# Patient Record
Sex: Female | Born: 1965 | Race: Black or African American | Hispanic: No | Marital: Married
Health system: Southern US, Community
[De-identification: ages and names within clinical notes are randomized; demographics above are authoritative.]

## PROBLEM LIST (undated history)

## (undated) DIAGNOSIS — J45909 Unspecified asthma, uncomplicated: Secondary | ICD-10-CM

## (undated) DIAGNOSIS — F29 Unspecified psychosis not due to a substance or known physiological condition: Secondary | ICD-10-CM

## (undated) HISTORY — DX: Unspecified psychosis not due to a substance or known physiological condition: F29

---

## 1999-04-17 ENCOUNTER — Other Ambulatory Visit: Admission: RE | Admit: 1999-04-17 | Discharge: 1999-04-17 | Payer: Self-pay | Admitting: *Deleted

## 2000-05-29 ENCOUNTER — Other Ambulatory Visit: Admission: RE | Admit: 2000-05-29 | Discharge: 2000-05-29 | Payer: Self-pay | Admitting: Obstetrics and Gynecology

## 2001-06-08 ENCOUNTER — Other Ambulatory Visit: Admission: RE | Admit: 2001-06-08 | Discharge: 2001-06-08 | Payer: Self-pay | Admitting: Obstetrics and Gynecology

## 2002-04-27 ENCOUNTER — Emergency Department (HOSPITAL_COMMUNITY): Admission: EM | Admit: 2002-04-27 | Discharge: 2002-04-27 | Payer: Self-pay

## 2002-08-05 ENCOUNTER — Other Ambulatory Visit: Admission: RE | Admit: 2002-08-05 | Discharge: 2002-08-05 | Payer: Self-pay | Admitting: Obstetrics and Gynecology

## 2003-09-08 ENCOUNTER — Other Ambulatory Visit: Admission: RE | Admit: 2003-09-08 | Discharge: 2003-09-08 | Payer: Self-pay | Admitting: Obstetrics and Gynecology

## 2003-10-10 ENCOUNTER — Other Ambulatory Visit: Admission: RE | Admit: 2003-10-10 | Discharge: 2003-10-10 | Payer: Self-pay | Admitting: Obstetrics & Gynecology

## 2003-12-14 ENCOUNTER — Encounter: Admission: RE | Admit: 2003-12-14 | Discharge: 2004-03-13 | Payer: Self-pay | Admitting: Obstetrics and Gynecology

## 2003-12-19 ENCOUNTER — Encounter: Admission: RE | Admit: 2003-12-19 | Discharge: 2003-12-19 | Payer: Self-pay | Admitting: Obstetrics and Gynecology

## 2005-08-21 ENCOUNTER — Emergency Department (HOSPITAL_COMMUNITY): Admission: EM | Admit: 2005-08-21 | Discharge: 2005-08-21 | Payer: Self-pay | Admitting: Family Medicine

## 2005-08-24 ENCOUNTER — Emergency Department (HOSPITAL_COMMUNITY): Admission: EM | Admit: 2005-08-24 | Discharge: 2005-08-24 | Payer: Self-pay | Admitting: Family Medicine

## 2005-08-26 ENCOUNTER — Emergency Department (HOSPITAL_COMMUNITY): Admission: EM | Admit: 2005-08-26 | Discharge: 2005-08-26 | Payer: Self-pay | Admitting: *Deleted

## 2005-09-03 ENCOUNTER — Ambulatory Visit: Payer: Self-pay | Admitting: Internal Medicine

## 2005-09-12 ENCOUNTER — Ambulatory Visit: Payer: Self-pay | Admitting: Internal Medicine

## 2005-09-16 ENCOUNTER — Ambulatory Visit (HOSPITAL_COMMUNITY): Admission: RE | Admit: 2005-09-16 | Discharge: 2005-09-16 | Payer: Self-pay | Admitting: Internal Medicine

## 2005-10-24 ENCOUNTER — Ambulatory Visit: Payer: Self-pay | Admitting: Internal Medicine

## 2006-02-04 ENCOUNTER — Encounter (INDEPENDENT_AMBULATORY_CARE_PROVIDER_SITE_OTHER): Payer: Self-pay | Admitting: *Deleted

## 2006-02-04 ENCOUNTER — Ambulatory Visit (HOSPITAL_COMMUNITY): Admission: RE | Admit: 2006-02-04 | Discharge: 2006-02-04 | Payer: Self-pay | Admitting: Obstetrics and Gynecology

## 2007-02-18 ENCOUNTER — Inpatient Hospital Stay (HOSPITAL_COMMUNITY): Admission: AD | Admit: 2007-02-18 | Discharge: 2007-02-18 | Payer: Self-pay | Admitting: Obstetrics and Gynecology

## 2007-02-27 ENCOUNTER — Inpatient Hospital Stay (HOSPITAL_COMMUNITY): Admission: AD | Admit: 2007-02-27 | Discharge: 2007-02-27 | Payer: Self-pay | Admitting: Obstetrics & Gynecology

## 2007-03-02 ENCOUNTER — Inpatient Hospital Stay (HOSPITAL_COMMUNITY): Admission: AD | Admit: 2007-03-02 | Discharge: 2007-03-05 | Payer: Self-pay | Admitting: *Deleted

## 2008-06-29 ENCOUNTER — Encounter: Admission: RE | Admit: 2008-06-29 | Discharge: 2008-06-29 | Payer: Self-pay | Admitting: Obstetrics and Gynecology

## 2009-07-02 ENCOUNTER — Encounter: Admission: RE | Admit: 2009-07-02 | Discharge: 2009-07-02 | Payer: Self-pay | Admitting: Obstetrics and Gynecology

## 2010-04-09 ENCOUNTER — Emergency Department (HOSPITAL_BASED_OUTPATIENT_CLINIC_OR_DEPARTMENT_OTHER): Admission: EM | Admit: 2010-04-09 | Discharge: 2010-04-09 | Payer: Self-pay | Admitting: Emergency Medicine

## 2010-04-09 ENCOUNTER — Ambulatory Visit: Payer: Self-pay | Admitting: Diagnostic Radiology

## 2010-07-05 ENCOUNTER — Encounter: Admission: RE | Admit: 2010-07-05 | Discharge: 2010-07-05 | Payer: Self-pay | Admitting: Obstetrics and Gynecology

## 2010-11-22 ENCOUNTER — Emergency Department (HOSPITAL_BASED_OUTPATIENT_CLINIC_OR_DEPARTMENT_OTHER)
Admission: EM | Admit: 2010-11-22 | Discharge: 2010-11-22 | Payer: Self-pay | Source: Home / Self Care | Admitting: Emergency Medicine

## 2011-02-25 LAB — GLUCOSE, CAPILLARY: Glucose-Capillary: 80 mg/dL (ref 70–99)

## 2011-03-04 LAB — DIFFERENTIAL
Basophils Absolute: 0.1 10*3/uL (ref 0.0–0.1)
Basophils Relative: 1 % (ref 0–1)
Eosinophils Absolute: 0.1 10*3/uL (ref 0.0–0.7)
Eosinophils Relative: 2 % (ref 0–5)
Lymphocytes Relative: 18 % (ref 12–46)
Lymphs Abs: 1 10*3/uL (ref 0.7–4.0)
Monocytes Absolute: 0.3 10*3/uL (ref 0.1–1.0)
Monocytes Relative: 5 % (ref 3–12)
Neutro Abs: 4.1 10*3/uL (ref 1.7–7.7)
Neutrophils Relative %: 74 % (ref 43–77)

## 2011-03-04 LAB — CBC
HCT: 38 % (ref 36.0–46.0)
Hemoglobin: 12.8 g/dL (ref 12.0–15.0)
MCHC: 33.7 g/dL (ref 30.0–36.0)
MCV: 96.2 fL (ref 78.0–100.0)
Platelets: 218 10*3/uL (ref 150–400)
RBC: 3.95 MIL/uL (ref 3.87–5.11)
RDW: 11.7 % (ref 11.5–15.5)
WBC: 5.6 10*3/uL (ref 4.0–10.5)

## 2011-03-04 LAB — BASIC METABOLIC PANEL
BUN: 13 mg/dL (ref 6–23)
CO2: 27 mEq/L (ref 19–32)
Calcium: 9.5 mg/dL (ref 8.4–10.5)
Chloride: 107 mEq/L (ref 96–112)
Creatinine, Ser: 0.8 mg/dL (ref 0.4–1.2)
GFR calc Af Amer: >60 mL/min (ref >60)
GFR calc non Af Amer: >60 mL/min (ref >60)
Glucose, Bld: 99 mg/dL (ref 70–99)
Potassium: 4.2 mEq/L (ref 3.5–5.1)
Sodium: 142 mEq/L (ref 135–145)

## 2011-03-04 LAB — SEDIMENTATION RATE: Sed Rate: 5 mm/hr (ref 0–22)

## 2011-05-02 NOTE — Op Note (Signed)
[name], Brandy Hogan               ACCOUNT NO.:  [id number]   MEDICAL RECORD NO.:  [id number]          PATIENT TYPE:  AMB   LOCATION:  SDC                           FACILITY:  [facility]   PHYSICIAN:  Maxie Better, M.D.DATE OF BIRTH:  February 15, 1966   DATE OF PROCEDURE:  02/04/2006  DATE OF DISCHARGE:                                 OPERATIVE REPORT   PREOPERATIVE DIAGNOSIS:  Blighted ovum.   OPERATION/PROCEDURE:  Suction dilation and evacuation.   POSTOPERATIVE DIAGNOSIS:  Blighted ovum.   ANESTHESIA:  MAC, paracervical block.   SURGEON:  Maxie Better, M.D.   DESCRIPTION OF PROCEDURE:  Under adequate monitored anesthesia, the patient  was placed in the dorsal lithotomy position.  She was sterilely prepped and  draped in the usual fashion.  The bladder was catheterized of a scant amount  of urine.  A bivalve speculum was placed in the vagina and 20 mL of 1%  Nesacaine was injected paracervically at 3 and 9 o'clock.  The anterior lip  of the cervix grasped with a single-tooth tenaculum.  The cervix was dilated  up to #25 [facility] dilator but would not accept a #27 despite several different  attempts.  At that point a #6 mm suction cannula was introduced into the  uterine cavity.  Tissue was obtained.  The cavity was then curetted,  suctioned, and curetted until all tissue was felt to have been removed at  which time all instruments were then removed from the vagina.  Specimen was  products of conception sent to pathology.  Estimated blood loss was minimal.  There were no complications.  The patient tolerated the procedure well, was  transferred to the recovery room in stable condition.      Maxie Better, M.D.  Electronically Signed     [place]/MEDQ  D:  02/04/2006  T:  02/05/2006  Job:  [id number]

## 2011-07-30 ENCOUNTER — Other Ambulatory Visit: Payer: Self-pay | Admitting: Obstetrics and Gynecology

## 2011-07-30 DIAGNOSIS — Z1231 Encounter for screening mammogram for malignant neoplasm of breast: Secondary | ICD-10-CM

## 2011-08-20 ENCOUNTER — Ambulatory Visit
Admission: RE | Admit: 2011-08-20 | Discharge: 2011-08-20 | Disposition: A | Discharge status: Home / Self Care | Payer: BC Managed Care – PPO | Source: Ambulatory Visit | Attending: Obstetrics and Gynecology | Admitting: Obstetrics and Gynecology

## 2011-08-20 DIAGNOSIS — Z1231 Encounter for screening mammogram for malignant neoplasm of breast: Secondary | ICD-10-CM

## 2012-08-11 ENCOUNTER — Other Ambulatory Visit: Payer: Self-pay | Admitting: Obstetrics and Gynecology

## 2012-08-11 DIAGNOSIS — Z1231 Encounter for screening mammogram for malignant neoplasm of breast: Secondary | ICD-10-CM

## 2012-09-09 ENCOUNTER — Ambulatory Visit
Admission: RE | Admit: 2012-09-09 | Discharge: 2012-09-09 | Disposition: A | Discharge status: Home / Self Care | Payer: Federal, State, Local not specified - PPO | Source: Ambulatory Visit | Attending: Obstetrics and Gynecology | Admitting: Obstetrics and Gynecology

## 2012-09-09 DIAGNOSIS — Z1231 Encounter for screening mammogram for malignant neoplasm of breast: Secondary | ICD-10-CM

## 2013-08-29 ENCOUNTER — Other Ambulatory Visit: Payer: Self-pay

## 2013-08-29 DIAGNOSIS — Z1231 Encounter for screening mammogram for malignant neoplasm of breast: Secondary | ICD-10-CM

## 2013-09-23 ENCOUNTER — Ambulatory Visit
Admission: RE | Admit: 2013-09-23 | Discharge: 2013-09-23 | Disposition: A | Discharge status: Home / Self Care | Payer: Federal, State, Local not specified - PPO | Source: Ambulatory Visit

## 2013-09-23 DIAGNOSIS — Z1231 Encounter for screening mammogram for malignant neoplasm of breast: Secondary | ICD-10-CM

## 2014-10-19 ENCOUNTER — Other Ambulatory Visit: Payer: Self-pay

## 2014-10-19 DIAGNOSIS — Z1231 Encounter for screening mammogram for malignant neoplasm of breast: Secondary | ICD-10-CM

## 2014-10-25 ENCOUNTER — Ambulatory Visit
Admission: RE | Admit: 2014-10-25 | Discharge: 2014-10-25 | Disposition: A | Discharge status: Home / Self Care | Payer: Federal, State, Local not specified - PPO | Source: Ambulatory Visit

## 2014-10-25 DIAGNOSIS — Z1231 Encounter for screening mammogram for malignant neoplasm of breast: Secondary | ICD-10-CM

## 2015-10-04 ENCOUNTER — Other Ambulatory Visit (HOSPITAL_COMMUNITY): Payer: Self-pay | Admitting: Internal Medicine

## 2015-10-04 DIAGNOSIS — R072 Precordial pain: Secondary | ICD-10-CM

## 2015-10-09 ENCOUNTER — Other Ambulatory Visit: Payer: Self-pay

## 2015-10-09 ENCOUNTER — Telehealth (HOSPITAL_COMMUNITY): Payer: Self-pay

## 2015-10-09 DIAGNOSIS — Z1231 Encounter for screening mammogram for malignant neoplasm of breast: Secondary | ICD-10-CM

## 2015-10-09 NOTE — Telephone Encounter (Signed)
Encounter complete. 

## 2015-10-10 ENCOUNTER — Telehealth (HOSPITAL_COMMUNITY): Payer: Self-pay

## 2015-10-10 NOTE — Telephone Encounter (Signed)
Encounter complete. 

## 2015-10-11 ENCOUNTER — Encounter (HOSPITAL_COMMUNITY): Payer: Self-pay | Admitting: *Deleted

## 2015-10-11 ENCOUNTER — Ambulatory Visit (HOSPITAL_COMMUNITY)
Admission: RE | Admit: 2015-10-11 | Discharge: 2015-10-11 | Disposition: A | Discharge status: Home / Self Care | Payer: Federal, State, Local not specified - PPO | Source: Ambulatory Visit | Attending: Cardiology | Admitting: Cardiology

## 2015-10-11 DIAGNOSIS — R072 Precordial pain: Secondary | ICD-10-CM | POA: Diagnosis not present

## 2015-10-11 LAB — EXERCISE TOLERANCE TEST
CSEPHR: 98 %
Estimated workload: 11.7 METS
Exercise duration (min): 10 min
MPHR: 173 {beats}/min
Peak HR: 171 {beats}/min
RPE: 15
Rest HR: 74 {beats}/min

## 2015-10-11 NOTE — Progress Notes (Unsigned)
Patient ID: Brandy Hogan, female   DOB: 1966/08/13, 49 y.o.   MRN: [id number]  Patient had upsloping ST depression and Dr. Herbie BaltimoreHarding reviewed, ok to d/c home.

## 2015-11-13 ENCOUNTER — Ambulatory Visit
Admission: RE | Admit: 2015-11-13 | Discharge: 2015-11-13 | Disposition: A | Discharge status: Home / Self Care | Payer: Federal, State, Local not specified - PPO | Source: Ambulatory Visit

## 2015-11-13 DIAGNOSIS — Z1231 Encounter for screening mammogram for malignant neoplasm of breast: Secondary | ICD-10-CM

## 2016-07-07 DIAGNOSIS — K080 Exfoliation of teeth due to systemic causes: Secondary | ICD-10-CM | POA: Diagnosis not present

## 2016-10-17 DIAGNOSIS — Z0000 Encounter for general adult medical examination without abnormal findings: Secondary | ICD-10-CM | POA: Diagnosis not present

## 2016-10-17 DIAGNOSIS — N390 Urinary tract infection, site not specified: Secondary | ICD-10-CM | POA: Diagnosis not present

## 2016-10-24 DIAGNOSIS — Z0000 Encounter for general adult medical examination without abnormal findings: Secondary | ICD-10-CM | POA: Diagnosis not present

## 2016-11-10 ENCOUNTER — Other Ambulatory Visit: Payer: Self-pay | Admitting: Internal Medicine

## 2016-11-10 DIAGNOSIS — Z1231 Encounter for screening mammogram for malignant neoplasm of breast: Secondary | ICD-10-CM

## 2016-11-13 ENCOUNTER — Ambulatory Visit
Admission: RE | Admit: 2016-11-13 | Discharge: 2016-11-13 | Disposition: A | Discharge status: Home / Self Care | Payer: Federal, State, Local not specified - PPO | Source: Ambulatory Visit | Attending: Internal Medicine | Admitting: Internal Medicine

## 2016-11-13 DIAGNOSIS — Z1231 Encounter for screening mammogram for malignant neoplasm of breast: Secondary | ICD-10-CM | POA: Diagnosis not present

## 2017-01-16 DIAGNOSIS — B349 Viral infection, unspecified: Secondary | ICD-10-CM | POA: Diagnosis not present

## 2017-03-04 DIAGNOSIS — Z6824 Body mass index (BMI) 24.0-24.9, adult: Secondary | ICD-10-CM | POA: Diagnosis not present

## 2017-03-04 DIAGNOSIS — Z1151 Encounter for screening for human papillomavirus (HPV): Secondary | ICD-10-CM | POA: Diagnosis not present

## 2017-03-04 DIAGNOSIS — Z01419 Encounter for gynecological examination (general) (routine) without abnormal findings: Secondary | ICD-10-CM | POA: Diagnosis not present

## 2017-10-21 ENCOUNTER — Other Ambulatory Visit: Payer: Self-pay | Admitting: Internal Medicine

## 2017-10-21 DIAGNOSIS — Z1231 Encounter for screening mammogram for malignant neoplasm of breast: Secondary | ICD-10-CM

## 2017-10-23 DIAGNOSIS — Z0000 Encounter for general adult medical examination without abnormal findings: Secondary | ICD-10-CM | POA: Diagnosis not present

## 2017-10-30 DIAGNOSIS — Z0000 Encounter for general adult medical examination without abnormal findings: Secondary | ICD-10-CM | POA: Diagnosis not present

## 2017-11-02 DIAGNOSIS — K080 Exfoliation of teeth due to systemic causes: Secondary | ICD-10-CM | POA: Diagnosis not present

## 2017-11-19 ENCOUNTER — Ambulatory Visit
Admission: RE | Admit: 2017-11-19 | Discharge: 2017-11-19 | Disposition: A | Discharge status: Home / Self Care | Payer: Federal, State, Local not specified - PPO | Source: Ambulatory Visit | Attending: Internal Medicine | Admitting: Internal Medicine

## 2017-11-19 DIAGNOSIS — Z1231 Encounter for screening mammogram for malignant neoplasm of breast: Secondary | ICD-10-CM | POA: Diagnosis not present

## 2018-01-10 DIAGNOSIS — G44329 Chronic post-traumatic headache, not intractable: Secondary | ICD-10-CM | POA: Diagnosis not present

## 2018-01-10 DIAGNOSIS — S098XXA Other specified injuries of head, initial encounter: Secondary | ICD-10-CM | POA: Diagnosis not present

## 2018-01-14 DIAGNOSIS — R51 Headache: Secondary | ICD-10-CM | POA: Diagnosis not present

## 2018-01-14 DIAGNOSIS — K080 Exfoliation of teeth due to systemic causes: Secondary | ICD-10-CM | POA: Diagnosis not present

## 2018-01-14 DIAGNOSIS — S098XXA Other specified injuries of head, initial encounter: Secondary | ICD-10-CM | POA: Diagnosis not present

## 2018-04-15 DIAGNOSIS — Z01419 Encounter for gynecological examination (general) (routine) without abnormal findings: Secondary | ICD-10-CM | POA: Diagnosis not present

## 2018-04-15 DIAGNOSIS — Z1151 Encounter for screening for human papillomavirus (HPV): Secondary | ICD-10-CM | POA: Diagnosis not present

## 2018-04-15 DIAGNOSIS — Z3A24 24 weeks gestation of pregnancy: Secondary | ICD-10-CM | POA: Diagnosis not present

## 2018-04-21 ENCOUNTER — Other Ambulatory Visit: Payer: Self-pay | Admitting: Internal Medicine

## 2018-04-21 DIAGNOSIS — E079 Disorder of thyroid, unspecified: Secondary | ICD-10-CM

## 2018-04-26 ENCOUNTER — Ambulatory Visit
Admission: RE | Admit: 2018-04-26 | Discharge: 2018-04-26 | Disposition: A | Discharge status: Home / Self Care | Payer: Federal, State, Local not specified - PPO | Source: Ambulatory Visit | Attending: Internal Medicine | Admitting: Internal Medicine

## 2018-04-26 DIAGNOSIS — E079 Disorder of thyroid, unspecified: Secondary | ICD-10-CM

## 2018-04-26 DIAGNOSIS — E041 Nontoxic single thyroid nodule: Secondary | ICD-10-CM | POA: Diagnosis not present

## 2018-05-14 DIAGNOSIS — B309 Viral conjunctivitis, unspecified: Secondary | ICD-10-CM | POA: Diagnosis not present

## 2018-05-27 DIAGNOSIS — B309 Viral conjunctivitis, unspecified: Secondary | ICD-10-CM | POA: Diagnosis not present

## 2018-06-04 DIAGNOSIS — B309 Viral conjunctivitis, unspecified: Secondary | ICD-10-CM | POA: Diagnosis not present

## 2018-07-08 DIAGNOSIS — B309 Viral conjunctivitis, unspecified: Secondary | ICD-10-CM | POA: Diagnosis not present

## 2018-07-19 DIAGNOSIS — K080 Exfoliation of teeth due to systemic causes: Secondary | ICD-10-CM | POA: Diagnosis not present

## 2018-10-14 ENCOUNTER — Other Ambulatory Visit: Payer: Self-pay | Admitting: Internal Medicine

## 2018-10-14 DIAGNOSIS — Z1231 Encounter for screening mammogram for malignant neoplasm of breast: Secondary | ICD-10-CM

## 2018-10-18 DIAGNOSIS — E041 Nontoxic single thyroid nodule: Secondary | ICD-10-CM | POA: Diagnosis not present

## 2018-10-18 DIAGNOSIS — R6889 Other general symptoms and signs: Secondary | ICD-10-CM | POA: Diagnosis not present

## 2018-11-10 DIAGNOSIS — E041 Nontoxic single thyroid nodule: Secondary | ICD-10-CM | POA: Diagnosis not present

## 2018-11-10 DIAGNOSIS — Z0000 Encounter for general adult medical examination without abnormal findings: Secondary | ICD-10-CM | POA: Diagnosis not present

## 2018-11-19 DIAGNOSIS — E041 Nontoxic single thyroid nodule: Secondary | ICD-10-CM | POA: Diagnosis not present

## 2018-11-19 DIAGNOSIS — Z0000 Encounter for general adult medical examination without abnormal findings: Secondary | ICD-10-CM | POA: Diagnosis not present

## 2018-11-26 ENCOUNTER — Ambulatory Visit
Admission: RE | Admit: 2018-11-26 | Discharge: 2018-11-26 | Disposition: A | Discharge status: Home / Self Care | Payer: Federal, State, Local not specified - PPO | Source: Ambulatory Visit | Attending: Internal Medicine | Admitting: Internal Medicine

## 2018-11-26 DIAGNOSIS — Z1231 Encounter for screening mammogram for malignant neoplasm of breast: Secondary | ICD-10-CM | POA: Diagnosis not present

## 2018-12-16 DIAGNOSIS — K5904 Chronic idiopathic constipation: Secondary | ICD-10-CM | POA: Diagnosis not present

## 2018-12-16 DIAGNOSIS — Z1211 Encounter for screening for malignant neoplasm of colon: Secondary | ICD-10-CM | POA: Diagnosis not present

## 2018-12-16 DIAGNOSIS — Z800 Family history of malignant neoplasm of digestive organs: Secondary | ICD-10-CM | POA: Diagnosis not present

## 2018-12-23 DIAGNOSIS — E041 Nontoxic single thyroid nodule: Secondary | ICD-10-CM | POA: Diagnosis not present

## 2018-12-27 ENCOUNTER — Other Ambulatory Visit: Payer: Self-pay | Admitting: Endocrinology

## 2018-12-27 DIAGNOSIS — E041 Nontoxic single thyroid nodule: Secondary | ICD-10-CM

## 2019-01-03 ENCOUNTER — Ambulatory Visit
Admission: RE | Admit: 2019-01-03 | Discharge: 2019-01-03 | Disposition: A | Discharge status: Home / Self Care | Payer: Federal, State, Local not specified - PPO | Source: Ambulatory Visit | Attending: Endocrinology | Admitting: Endocrinology

## 2019-01-03 DIAGNOSIS — E042 Nontoxic multinodular goiter: Secondary | ICD-10-CM | POA: Diagnosis not present

## 2019-01-03 DIAGNOSIS — E041 Nontoxic single thyroid nodule: Secondary | ICD-10-CM

## 2019-01-20 DIAGNOSIS — K080 Exfoliation of teeth due to systemic causes: Secondary | ICD-10-CM | POA: Diagnosis not present

## 2019-02-01 DIAGNOSIS — J4520 Mild intermittent asthma, uncomplicated: Secondary | ICD-10-CM | POA: Diagnosis not present

## 2019-02-01 DIAGNOSIS — J3089 Other allergic rhinitis: Secondary | ICD-10-CM | POA: Diagnosis not present

## 2019-02-01 DIAGNOSIS — J301 Allergic rhinitis due to pollen: Secondary | ICD-10-CM | POA: Diagnosis not present

## 2019-02-01 DIAGNOSIS — J3081 Allergic rhinitis due to animal (cat) (dog) hair and dander: Secondary | ICD-10-CM | POA: Diagnosis not present

## 2019-02-06 DIAGNOSIS — B349 Viral infection, unspecified: Secondary | ICD-10-CM | POA: Diagnosis not present

## 2019-03-03 DIAGNOSIS — T781XXA Other adverse food reactions, not elsewhere classified, initial encounter: Secondary | ICD-10-CM | POA: Diagnosis not present

## 2019-04-20 DIAGNOSIS — Z1151 Encounter for screening for human papillomavirus (HPV): Secondary | ICD-10-CM | POA: Diagnosis not present

## 2019-04-20 DIAGNOSIS — Z01419 Encounter for gynecological examination (general) (routine) without abnormal findings: Secondary | ICD-10-CM | POA: Diagnosis not present

## 2019-04-20 DIAGNOSIS — Z6824 Body mass index (BMI) 24.0-24.9, adult: Secondary | ICD-10-CM | POA: Diagnosis not present

## 2019-04-22 DIAGNOSIS — Z800 Family history of malignant neoplasm of digestive organs: Secondary | ICD-10-CM | POA: Diagnosis not present

## 2019-04-22 DIAGNOSIS — Z1211 Encounter for screening for malignant neoplasm of colon: Secondary | ICD-10-CM | POA: Diagnosis not present

## 2019-06-08 IMAGING — MG DIGITAL SCREENING BILATERAL MAMMOGRAM WITH TOMO AND CAD
8 series · 9 of 24 positions shown · non-contrast
Comparison: Previous exam(s).

CLINICAL DATA: Screening.

EXAM:
DIGITAL SCREENING BILATERAL MAMMOGRAM WITH TOMO AND CAD

[L MLO synth-2D]
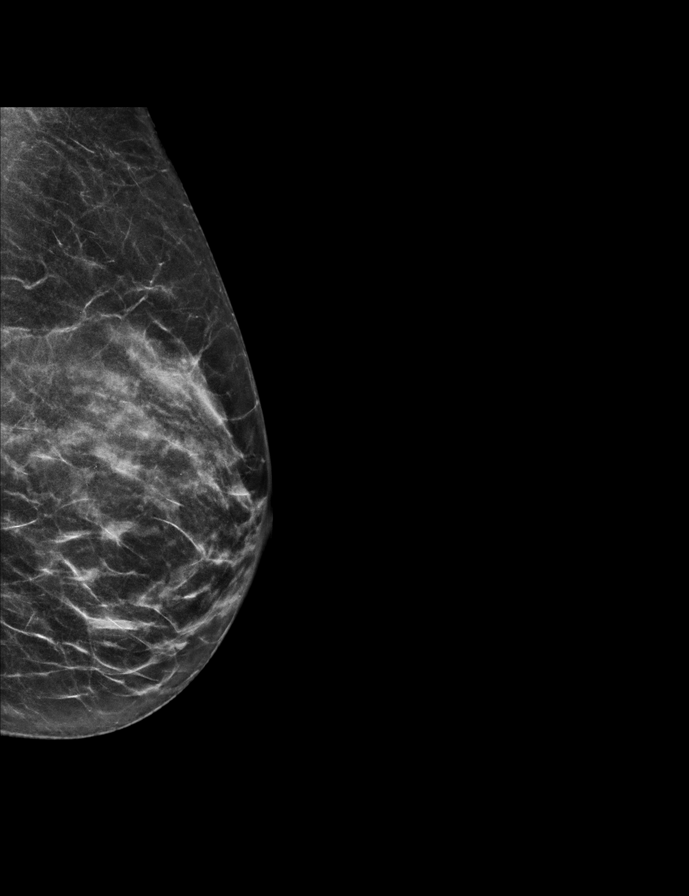

[R MLO synth-2D]
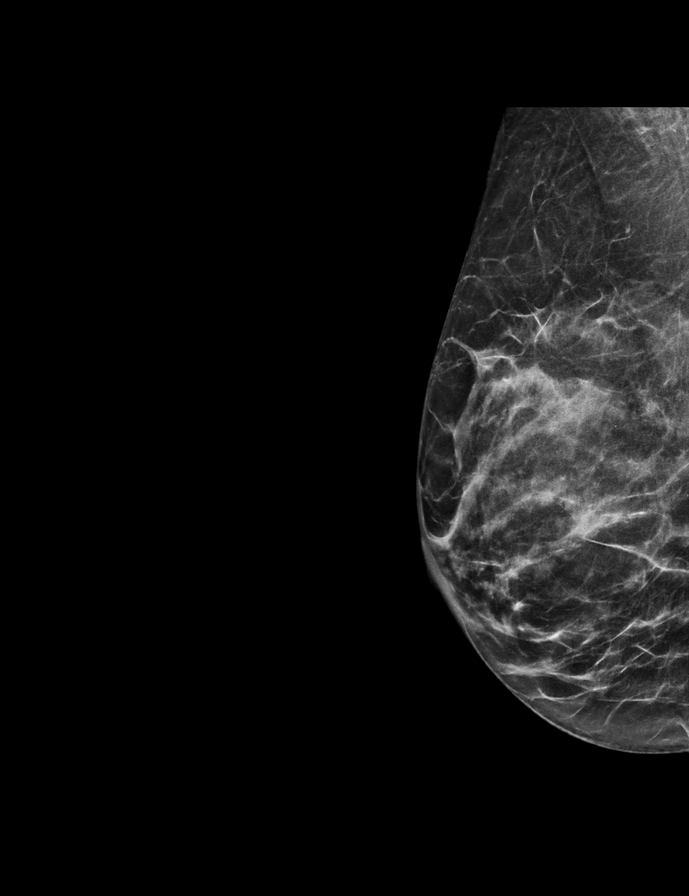

[L CC synth-2D]
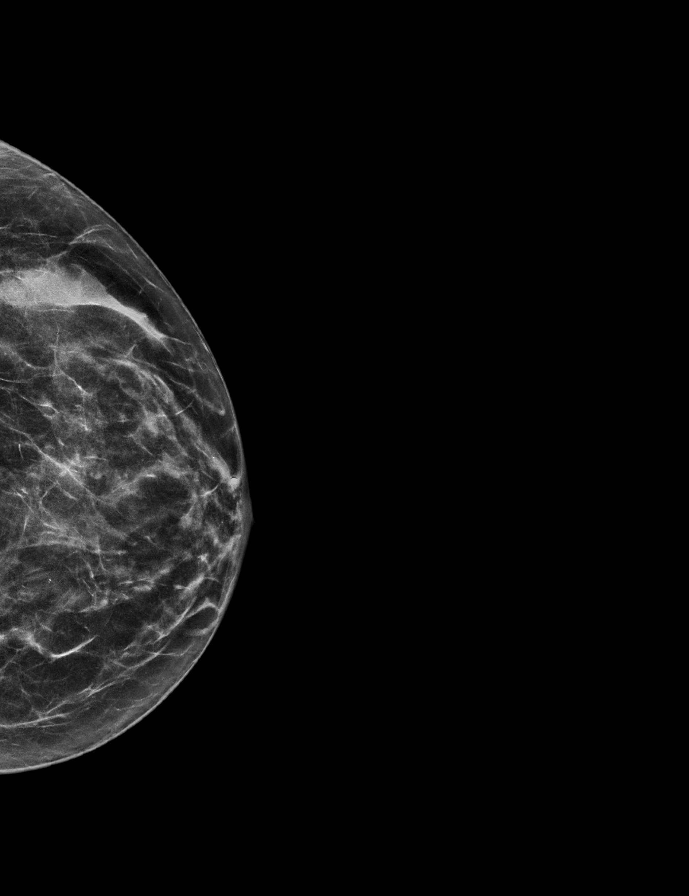

[R CC synth-2D]
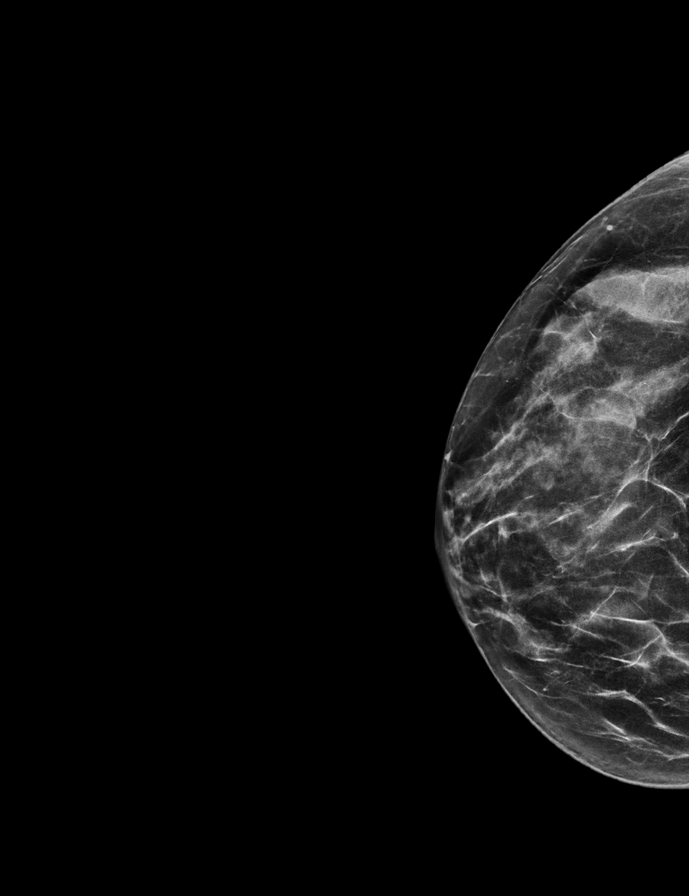

[L MLO tomo · 2 of 51 frames shown]
[frame 17/51]
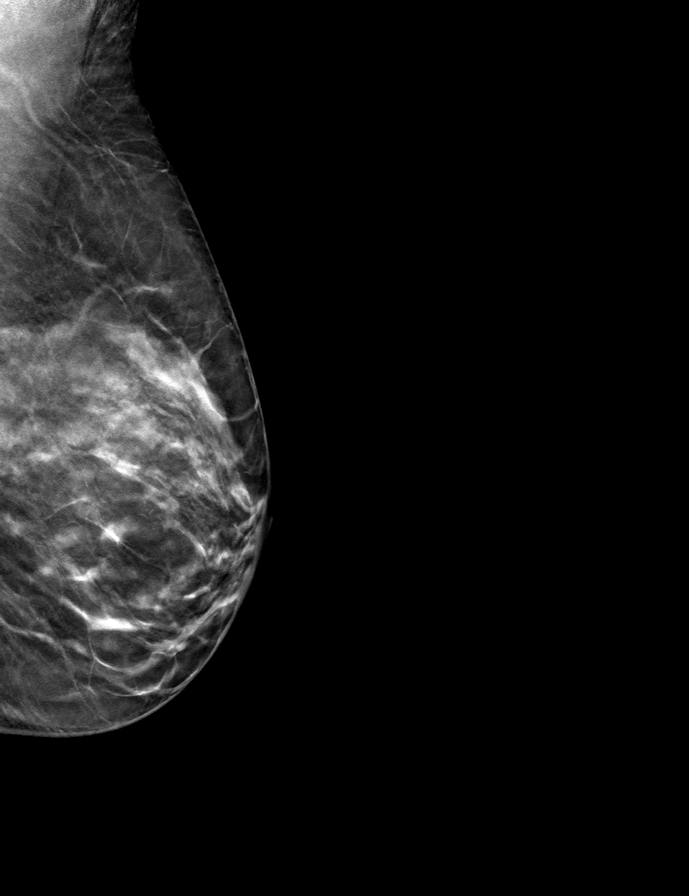
[frame 26/51]
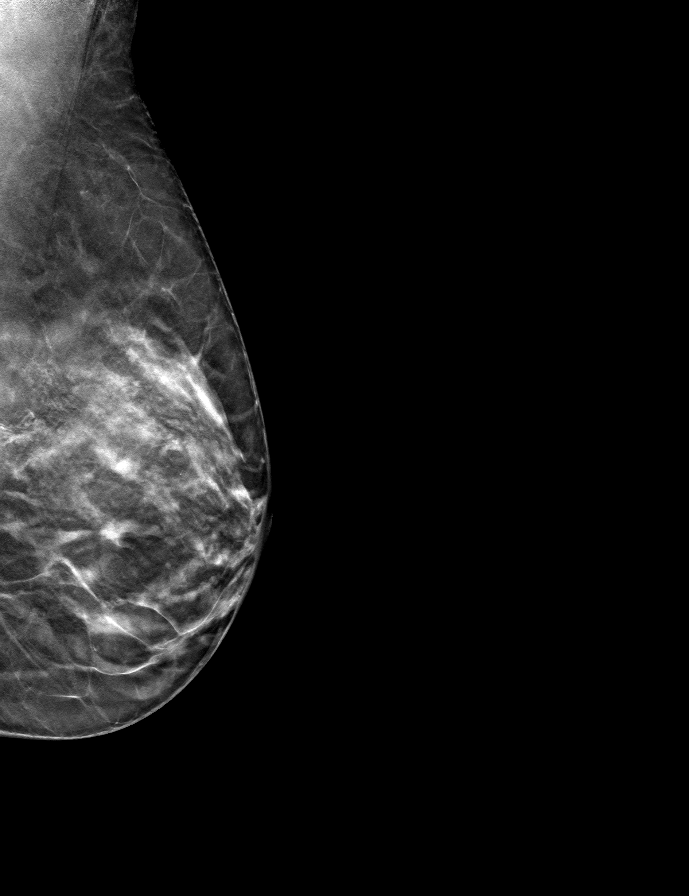

[R CC tomo · tomo slice 27/53.0]
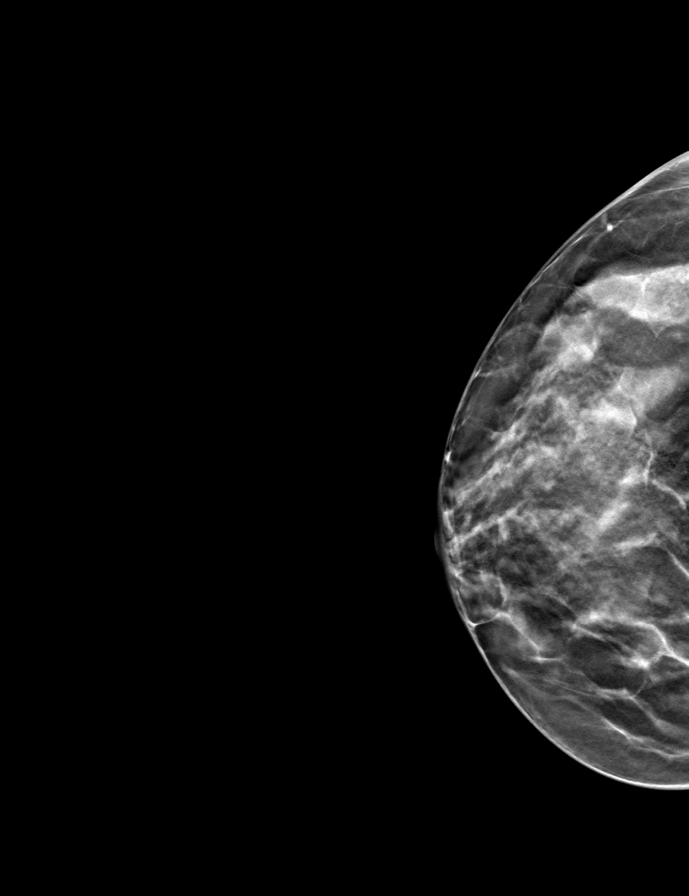

[L CC tomo · tomo slice 25/49.0]
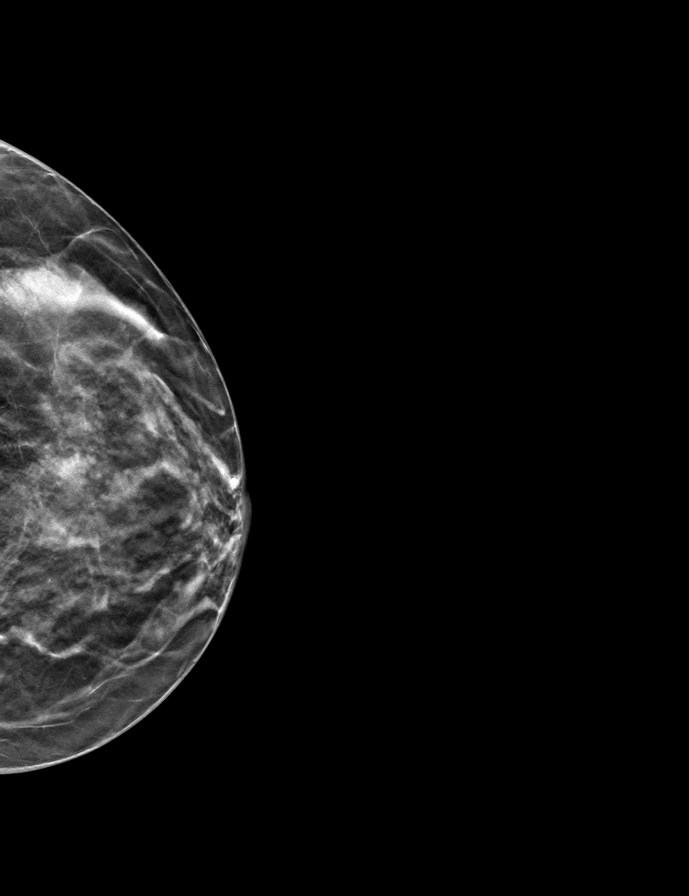

[R MLO tomo · tomo slice 27/53.0]
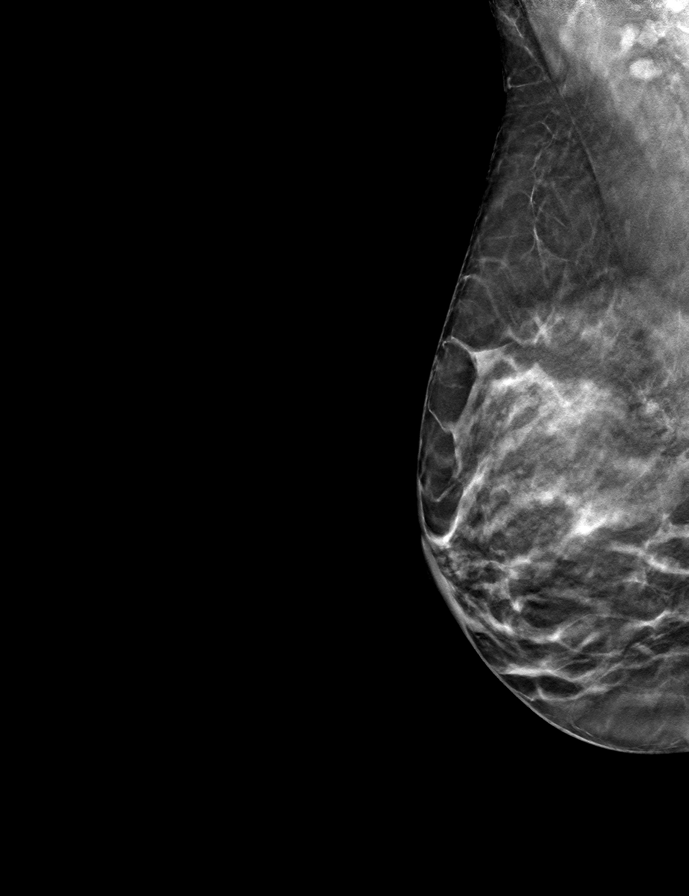

[9 of 24 positions shown; findings below may reference images not displayed]

ACR Breast Density Category c: The breast tissue is heterogeneously
dense, which may obscure small masses.
FINDINGS: There are no findings suspicious for malignancy. Images were
processed with CAD.
IMPRESSION: No mammographic evidence of malignancy. A result letter of this
screening mammogram will be mailed directly to the patient.

RECOMMENDATION:
Screening mammogram in one year. (Code:FT-U-LHB)

BI-RADS CATEGORY  1: Negative.

## 2019-07-18 DIAGNOSIS — Z8619 Personal history of other infectious and parasitic diseases: Secondary | ICD-10-CM | POA: Diagnosis not present

## 2019-11-18 DIAGNOSIS — Z0000 Encounter for general adult medical examination without abnormal findings: Secondary | ICD-10-CM | POA: Diagnosis not present

## 2019-11-18 DIAGNOSIS — E041 Nontoxic single thyroid nodule: Secondary | ICD-10-CM | POA: Diagnosis not present

## 2019-11-18 DIAGNOSIS — Z13220 Encounter for screening for lipoid disorders: Secondary | ICD-10-CM | POA: Diagnosis not present

## 2019-11-24 ENCOUNTER — Other Ambulatory Visit: Payer: Self-pay | Admitting: Internal Medicine

## 2019-11-24 DIAGNOSIS — Z1231 Encounter for screening mammogram for malignant neoplasm of breast: Secondary | ICD-10-CM

## 2019-11-25 DIAGNOSIS — E041 Nontoxic single thyroid nodule: Secondary | ICD-10-CM | POA: Diagnosis not present

## 2019-11-25 DIAGNOSIS — Z0000 Encounter for general adult medical examination without abnormal findings: Secondary | ICD-10-CM | POA: Diagnosis not present

## 2019-11-25 DIAGNOSIS — F411 Generalized anxiety disorder: Secondary | ICD-10-CM | POA: Diagnosis not present

## 2019-12-11 IMAGING — US US THYROID
1 series · 14 of 25 positions shown · non-contrast
Comparison: 04/26/2018

CLINICAL DATA: Nodule

EXAM:
THYROID ULTRASOUND
TECHNIQUE: Ultrasound examination of the thyroid gland and adjacent soft
tissues was performed.

[Series 1: us thyroid · 0.05mm/px · 14 of 42 slices shown]
[im 1/42]
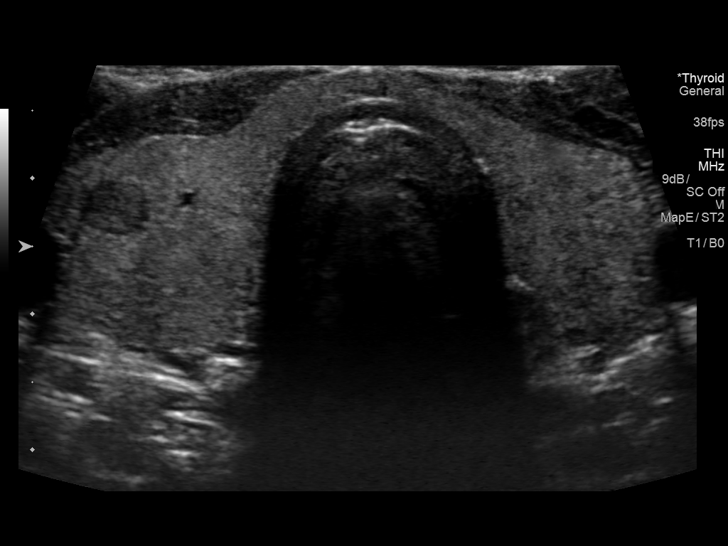
[im 4/42]
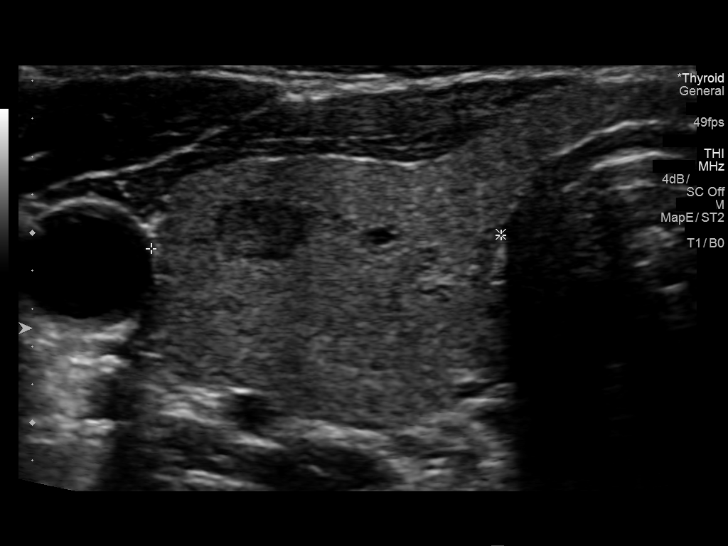
[im 7/42]
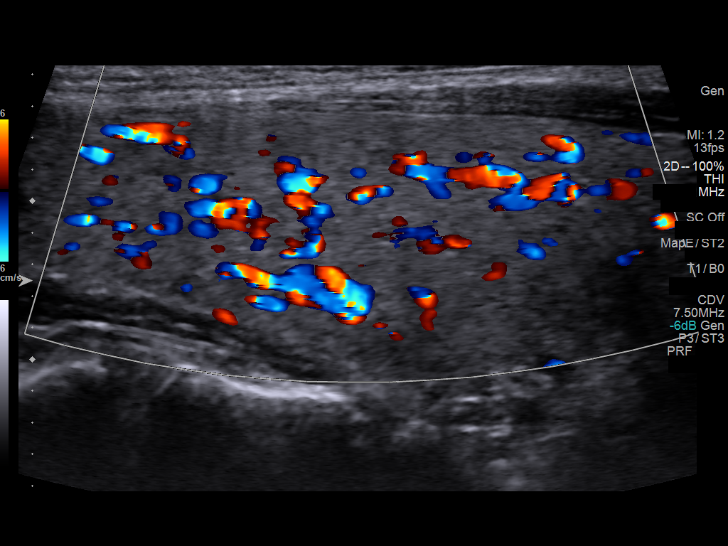
[im 11/42]
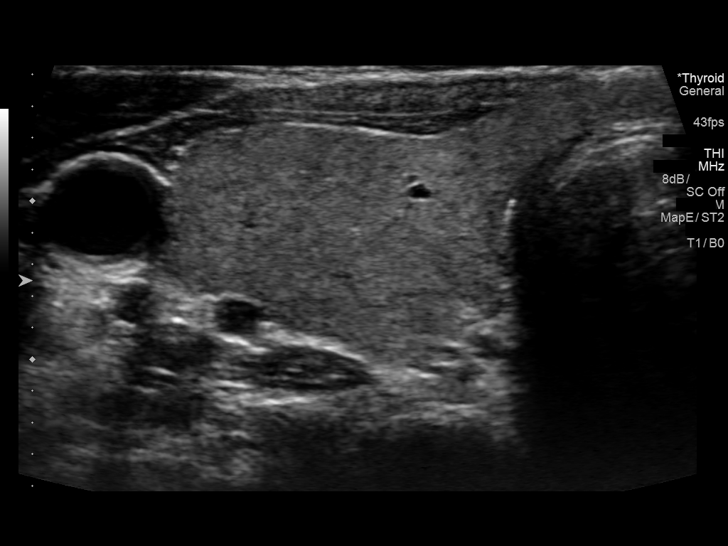
[im 14/42]
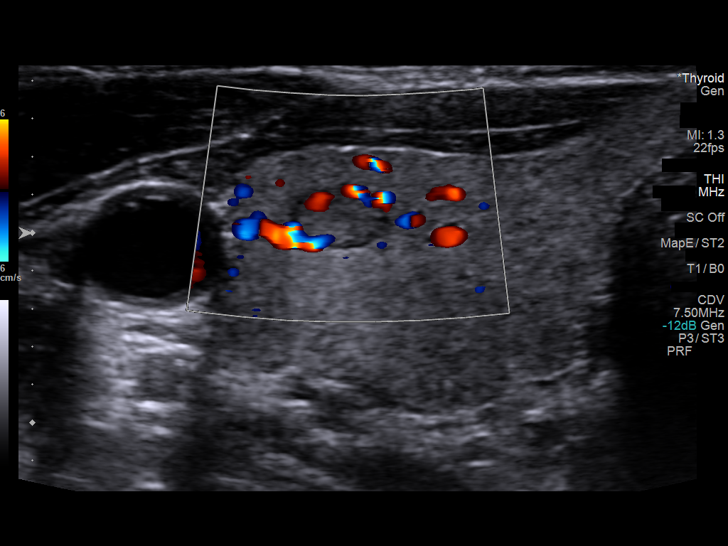
[im 16/42]
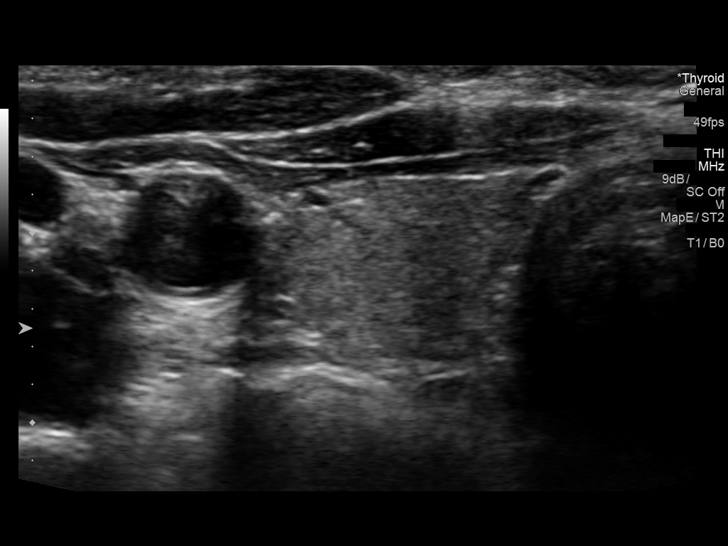
[im 19/42]
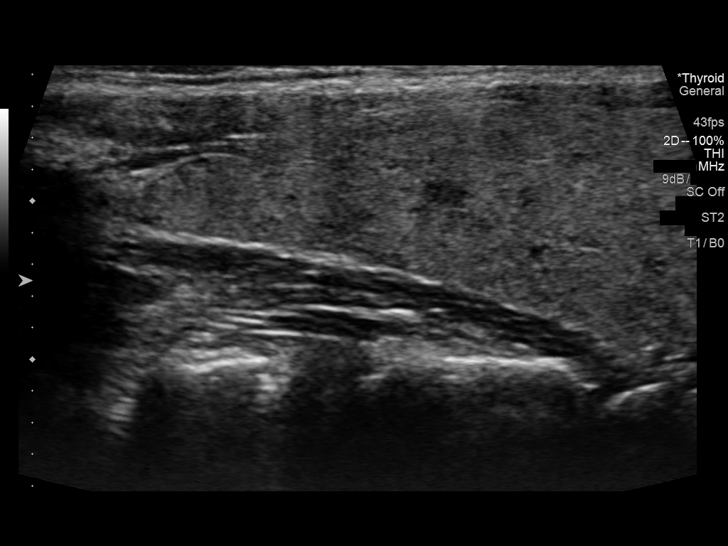
[im 23/42]
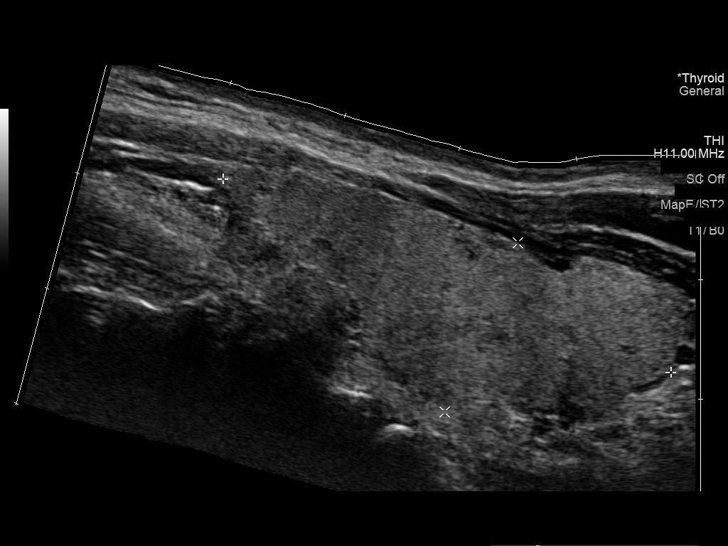
[im 26/42]
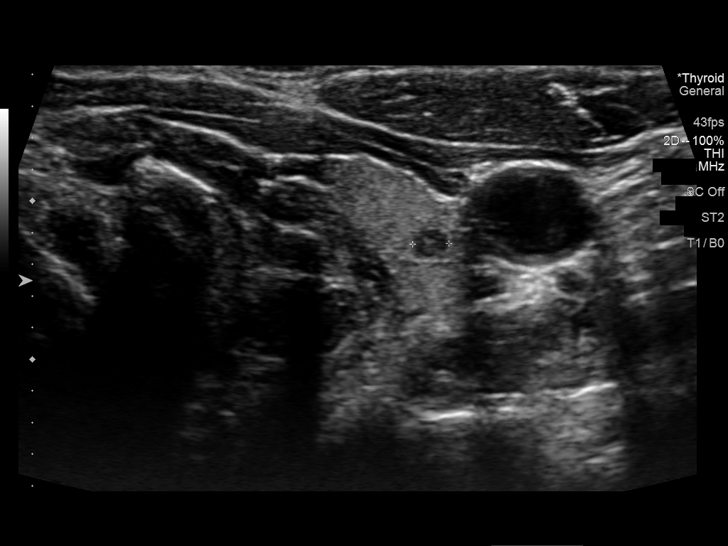
[im 28/42]
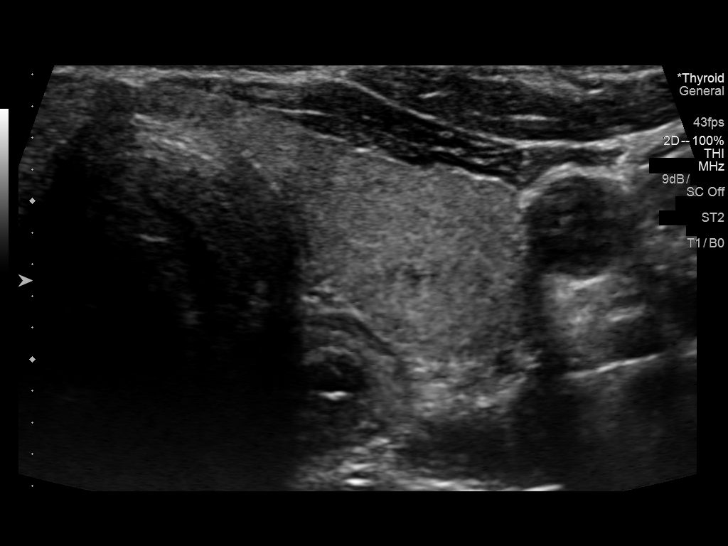
[im 31/42]
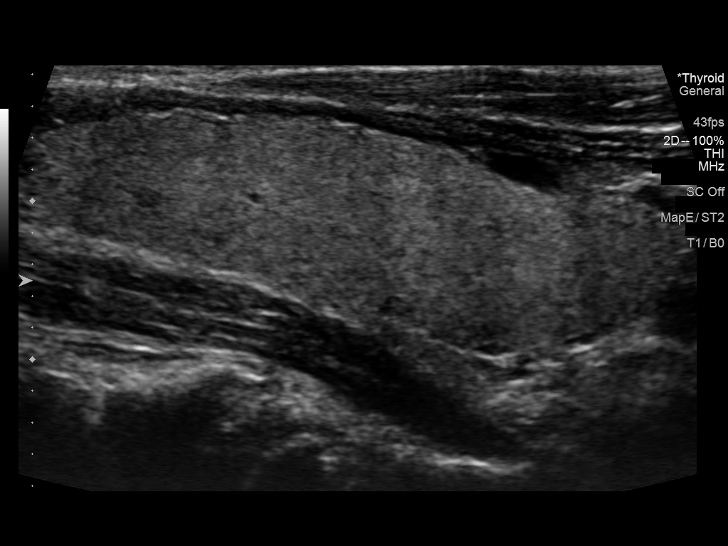
[im 35/42]
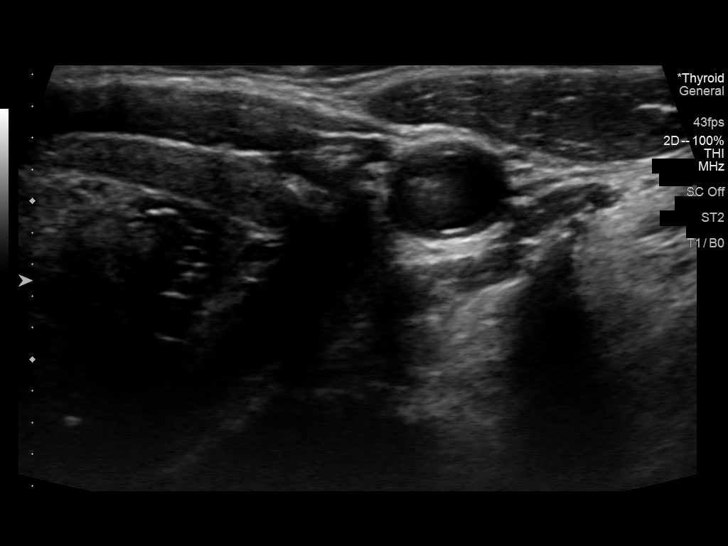
[im 38/42]
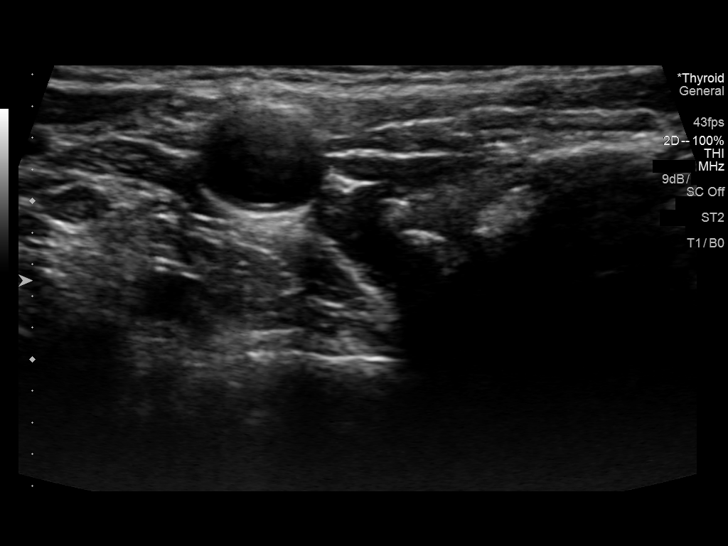
[im 42/42]
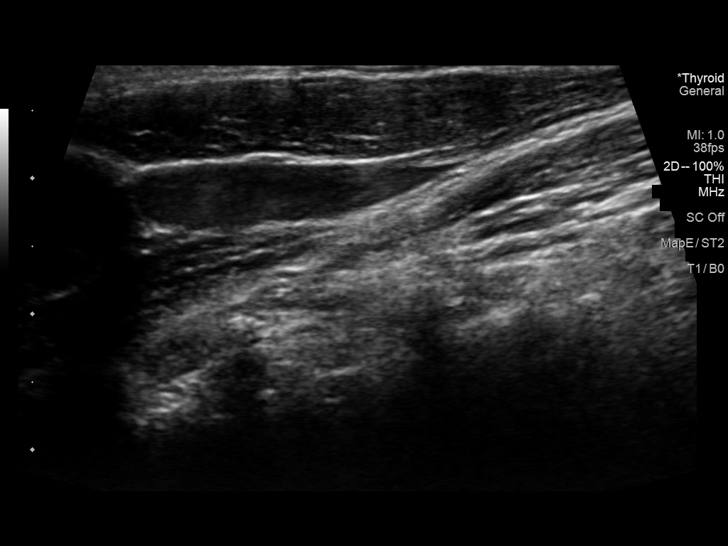

[14 of 25 positions shown; findings below may reference images not displayed]

FINDINGS: Parenchymal Echotexture: Mildly heterogenous

Isthmus: 0.3 cm thickness, previously

Right lobe: 5.4 x 1.9 x 1.8 cm, previously 5.3 x 2 x

Left lobe: 4.1 x 1.5 x 1.6 cm, previously 4.5 x 1.8 x

_________________________________________________________

Estimated total number of nodules >/= 1 cm: 0

Number of spongiform nodules >/=  2 cm not described below (TR1): 0

Number of mixed cystic and solid nodules >/= 1.5 cm not described
below (TR2): 0

_________________________________________________________

The isthmic nodule described on prior study is no longer identified.
Few scattered hypoechoic nodules bilaterally none greater than
cm.
IMPRESSION: 1. Borderline thyromegaly with small bilateral nodules. None meets
criteria for biopsy or dedicated imaging follow-up.

The above is in keeping with the ACR TI-RADS recommendations - [HOSPITAL] 2985;[DATE].

## 2019-12-26 DIAGNOSIS — E041 Nontoxic single thyroid nodule: Secondary | ICD-10-CM | POA: Diagnosis not present

## 2020-01-02 DIAGNOSIS — E041 Nontoxic single thyroid nodule: Secondary | ICD-10-CM | POA: Diagnosis not present

## 2020-01-11 ENCOUNTER — Other Ambulatory Visit: Payer: Self-pay | Admitting: Endocrinology

## 2020-01-11 DIAGNOSIS — E041 Nontoxic single thyroid nodule: Secondary | ICD-10-CM

## 2020-01-12 ENCOUNTER — Other Ambulatory Visit: Payer: Self-pay

## 2020-01-12 ENCOUNTER — Ambulatory Visit
Admission: RE | Admit: 2020-01-12 | Discharge: 2020-01-12 | Disposition: A | Discharge status: Home / Self Care | Payer: Federal, State, Local not specified - PPO | Source: Ambulatory Visit | Attending: Internal Medicine | Admitting: Internal Medicine

## 2020-01-12 DIAGNOSIS — Z1231 Encounter for screening mammogram for malignant neoplasm of breast: Secondary | ICD-10-CM | POA: Diagnosis not present

## 2020-04-26 DIAGNOSIS — Z1151 Encounter for screening for human papillomavirus (HPV): Secondary | ICD-10-CM | POA: Diagnosis not present

## 2020-04-26 DIAGNOSIS — Z6826 Body mass index (BMI) 26.0-26.9, adult: Secondary | ICD-10-CM | POA: Diagnosis not present

## 2020-04-26 DIAGNOSIS — Z01419 Encounter for gynecological examination (general) (routine) without abnormal findings: Secondary | ICD-10-CM | POA: Diagnosis not present

## 2020-10-03 DIAGNOSIS — Z91018 Allergy to other foods: Secondary | ICD-10-CM | POA: Diagnosis not present

## 2020-10-03 DIAGNOSIS — J4520 Mild intermittent asthma, uncomplicated: Secondary | ICD-10-CM | POA: Diagnosis not present

## 2020-10-23 ENCOUNTER — Telehealth: Payer: Self-pay | Admitting: Registered Nurse

## 2020-10-23 NOTE — Telephone Encounter (Signed)
10/23/2020 - PATIENT STATES SHE REQUESTED A COPY OF THE DEPOMEDROL 80 mg SHOT SHE RECEIVED SOME TIME AROUND 04/27/2002 FOR HIVES. SHE SAID SOMEONE FROM [employer] CALLED HER ON Monday (10/22/20) BUT THE PHONE DISCONNECTED. PLEASE CALL HER TO LET HER KNOW WHAT THE STATUS IS ON THIS. BEST PHONE [phone] (PLEASE LEAVE HER A MESSAGE) SHE WOULD LIKE IT TO BE E-MAILED IF POSSIBLE. ([email or web address]) MBC

## 2020-10-24 NOTE — Telephone Encounter (Signed)
I spoke with pt and found what she was looking for. I then e-mailed her, her record.

## 2020-11-14 ENCOUNTER — Other Ambulatory Visit: Payer: Federal, State, Local not specified - PPO

## 2020-11-22 ENCOUNTER — Other Ambulatory Visit: Payer: Federal, State, Local not specified - PPO

## 2020-11-23 DIAGNOSIS — Z0000 Encounter for general adult medical examination without abnormal findings: Secondary | ICD-10-CM | POA: Diagnosis not present

## 2020-11-23 DIAGNOSIS — N390 Urinary tract infection, site not specified: Secondary | ICD-10-CM | POA: Diagnosis not present

## 2020-11-23 DIAGNOSIS — E041 Nontoxic single thyroid nodule: Secondary | ICD-10-CM | POA: Diagnosis not present

## 2020-11-23 DIAGNOSIS — Z13220 Encounter for screening for lipoid disorders: Secondary | ICD-10-CM | POA: Diagnosis not present

## 2020-11-23 DIAGNOSIS — E559 Vitamin D deficiency, unspecified: Secondary | ICD-10-CM | POA: Diagnosis not present

## 2020-11-30 ENCOUNTER — Ambulatory Visit
Admission: RE | Admit: 2020-11-30 | Discharge: 2020-11-30 | Disposition: A | Discharge status: Home / Self Care | Payer: Federal, State, Local not specified - PPO | Source: Ambulatory Visit | Attending: Endocrinology | Admitting: Endocrinology

## 2020-11-30 DIAGNOSIS — E041 Nontoxic single thyroid nodule: Secondary | ICD-10-CM

## 2020-11-30 DIAGNOSIS — Z0000 Encounter for general adult medical examination without abnormal findings: Secondary | ICD-10-CM | POA: Diagnosis not present

## 2020-11-30 DIAGNOSIS — F411 Generalized anxiety disorder: Secondary | ICD-10-CM | POA: Diagnosis not present

## 2021-02-04 ENCOUNTER — Other Ambulatory Visit: Payer: Self-pay | Admitting: Internal Medicine

## 2021-02-04 DIAGNOSIS — Z1231 Encounter for screening mammogram for malignant neoplasm of breast: Secondary | ICD-10-CM

## 2021-03-26 ENCOUNTER — Other Ambulatory Visit: Payer: Self-pay

## 2021-03-26 ENCOUNTER — Ambulatory Visit
Admission: RE | Admit: 2021-03-26 | Discharge: 2021-03-26 | Disposition: A | Discharge status: Home / Self Care | Payer: No Typology Code available for payment source | Source: Ambulatory Visit | Attending: Internal Medicine | Admitting: Internal Medicine

## 2021-03-26 DIAGNOSIS — Z1231 Encounter for screening mammogram for malignant neoplasm of breast: Secondary | ICD-10-CM

## 2022-01-31 ENCOUNTER — Other Ambulatory Visit: Payer: Self-pay | Admitting: Endocrinology

## 2022-01-31 DIAGNOSIS — E038 Other specified hypothyroidism: Secondary | ICD-10-CM

## 2022-02-06 ENCOUNTER — Ambulatory Visit
Admission: RE | Admit: 2022-02-06 | Discharge: 2022-02-06 | Disposition: A | Discharge status: Home / Self Care | Payer: No Typology Code available for payment source | Source: Ambulatory Visit | Attending: Endocrinology | Admitting: Endocrinology

## 2022-02-06 DIAGNOSIS — E038 Other specified hypothyroidism: Secondary | ICD-10-CM

## 2022-04-01 ENCOUNTER — Other Ambulatory Visit: Payer: Self-pay | Admitting: Internal Medicine

## 2022-04-01 DIAGNOSIS — Z1231 Encounter for screening mammogram for malignant neoplasm of breast: Secondary | ICD-10-CM

## 2022-04-04 ENCOUNTER — Ambulatory Visit
Admission: RE | Admit: 2022-04-04 | Discharge: 2022-04-04 | Disposition: A | Discharge status: Home / Self Care | Payer: No Typology Code available for payment source | Source: Ambulatory Visit | Attending: Internal Medicine | Admitting: Internal Medicine

## 2022-04-04 DIAGNOSIS — Z1231 Encounter for screening mammogram for malignant neoplasm of breast: Secondary | ICD-10-CM

## 2022-10-28 ENCOUNTER — Emergency Department (HOSPITAL_COMMUNITY): Payer: No Typology Code available for payment source

## 2022-10-28 ENCOUNTER — Emergency Department (HOSPITAL_COMMUNITY)
Admission: EM | Admit: 2022-10-28 | Discharge: 2022-10-29 | Disposition: A | Discharge status: Home / Self Care | Payer: No Typology Code available for payment source | Attending: Emergency Medicine | Admitting: Emergency Medicine

## 2022-10-28 DIAGNOSIS — E876 Hypokalemia: Secondary | ICD-10-CM | POA: Diagnosis not present

## 2022-10-28 DIAGNOSIS — F22 Delusional disorders: Secondary | ICD-10-CM | POA: Insufficient documentation

## 2022-10-28 DIAGNOSIS — N3000 Acute cystitis without hematuria: Secondary | ICD-10-CM | POA: Diagnosis not present

## 2022-10-28 LAB — CBC WITH DIFFERENTIAL/PLATELET
Abs Immature Granulocytes: 0.01 10*3/uL (ref 0.00–0.07)
Basophils Absolute: 0 10*3/uL (ref 0.0–0.1)
Basophils Relative: 1 %
Eosinophils Absolute: 0.3 10*3/uL (ref 0.0–0.5)
Eosinophils Relative: 5 %
HCT: 41.4 % (ref 36.0–46.0)
Hemoglobin: 13.7 g/dL (ref 12.0–15.0)
Immature Granulocytes: 0 %
Lymphocytes Relative: 33 %
Lymphs Abs: 1.8 10*3/uL (ref 0.7–4.0)
MCH: 31.3 pg (ref 26.0–34.0)
MCHC: 33.1 g/dL (ref 30.0–36.0)
MCV: 94.5 fL (ref 80.0–100.0)
Monocytes Absolute: 0.5 10*3/uL (ref 0.1–1.0)
Monocytes Relative: 9 %
Neutro Abs: 2.8 10*3/uL (ref 1.7–7.7)
Neutrophils Relative %: 52 %
Platelets: 301 10*3/uL (ref 150–400)
RBC: 4.38 MIL/uL (ref 3.87–5.11)
RDW: 13.5 % (ref 11.5–15.5)
WBC: 5.3 10*3/uL (ref 4.0–10.5)
nRBC: 0 % (ref 0.0–0.2)

## 2022-10-28 LAB — COMPREHENSIVE METABOLIC PANEL
ALT: 25 U/L (ref 0–44)
AST: 25 U/L (ref 15–41)
Albumin: 4.1 g/dL (ref 3.5–5.0)
Alkaline Phosphatase: 38 U/L (ref 38–126)
Anion gap: 12 (ref 5–15)
BUN: 17 mg/dL (ref 6–20)
CO2: 25 mmol/L (ref 22–32)
Calcium: 10 mg/dL (ref 8.9–10.3)
Chloride: 103 mmol/L (ref 98–111)
Creatinine, Ser: 1.02 mg/dL — ABNORMAL HIGH (ref 0.44–1.00)
GFR, Estimated: >60 mL/min (ref >60)
Glucose, Bld: 99 mg/dL (ref 70–99)
Potassium: 3.2 mmol/L — ABNORMAL LOW (ref 3.5–5.1)
Sodium: 140 mmol/L (ref 135–145)
Total Bilirubin: 0.6 mg/dL (ref 0.3–1.2)
Total Protein: 7.1 g/dL (ref 6.5–8.1)

## 2022-10-28 LAB — URINALYSIS, ROUTINE W REFLEX MICROSCOPIC
Bilirubin Urine: NEGATIVE
Glucose, UA: NEGATIVE mg/dL
Hgb urine dipstick: NEGATIVE
Ketones, ur: NEGATIVE mg/dL
Nitrite: NEGATIVE
Protein, ur: 100 mg/dL — AB
Specific Gravity, Urine: 1.031 — ABNORMAL HIGH (ref 1.005–1.030)
pH: 5 (ref 5.0–8.0)

## 2022-10-28 LAB — RAPID URINE DRUG SCREEN, HOSP PERFORMED
Amphetamines: NOT DETECTED
Barbiturates: NOT DETECTED
Benzodiazepines: NOT DETECTED
Cocaine: NOT DETECTED
Opiates: NOT DETECTED
Tetrahydrocannabinol: NOT DETECTED

## 2022-10-28 LAB — ETHANOL: Alcohol, Ethyl (B): <10 mg/dL (ref <10)

## 2022-10-28 NOTE — ED Provider Triage Note (Signed)
Emergency Medicine Provider Triage Evaluation Note  Brandy Hogan , a 56 y.o. female  was evaluated in triage.  Pt's husband states patient has had intermittent paranoia episodes since October 2023. Reports that she states that she states he and other people are out to get her. Patient reports 10-15 lb weight loss. No SI/HI. Denies AVH. Reports God tells her things.  Review of Systems  Positive: Weight loss, paranoia Negative: Fever, chills  Physical Exam  BP (!) 143/67 (BP Location: Right Arm)   Pulse 69   Temp 98 F (36.7 C)   Resp 16   LMP 09/08/2012   SpO2 97%  Gen:   Awake, no distress   Resp:  Normal effort  MSK:   Moves extremities without difficulty   Medical Decision Making  Medically screening exam initiated at 3:55 PM.  Appropriate orders placed.  Brandy Hogan was informed that the remainder of the evaluation will be completed by another provider, this initial triage assessment does not replace that evaluation, and the importance of remaining in the ED until their evaluation is complete.   Pete Pelt, [place] 10/28/22 1603

## 2022-10-28 NOTE — ED Triage Notes (Signed)
Pt here requesting lab work and CT head for "someone potentially poisoning her". Pt's husband at bedside stating pt has been having erratic behavior since early Oct. No mental health diagnosis prior. Pt denies any SI/HI. Endorses audible hallucinations of "hearing God". Pt's husband voicing hyper religiosity since October.

## 2022-10-29 LAB — TSH: TSH: 1.608 u[IU]/mL (ref 0.350–4.500)

## 2022-10-29 LAB — SEDIMENTATION RATE: Sed Rate: 0 mm/hr (ref 0–22)

## 2022-10-29 MED ORDER — CEPHALEXIN 500 MG PO CAPS: 500.0000 mg | ORAL_CAPSULE | Freq: Four times a day (QID) | ORAL | 0 refills | Status: DC

## 2022-10-29 NOTE — ED Provider Notes (Signed)
[facility] EMERGENCY DEPARTMENT Provider Note   CSN: [id number] Arrival date & time: 10/28/22  1452     History  Chief Complaint  Patient presents with   Paranoid    Brandy Hogan is a 56 y.o. female.  HPI   Patient with no documented comorbidities presents today due to unintentional weight loss and behavioral changes x1 month.  Patient has had decreased appetite and has been unintentionally losing weight.  Her sleep schedule appears to be the same but she reports her family is concerned about behavioral changes.  She states she is concerned that every time she eats or drinks she may be poisoned, states she feels ill after eating no matter where she eats.  She also states she is seeing news reports about poisonings here in [place] and is worried she is a victim.  Her husband states she has always had a close relationship with God but recently she is hearing things that are "not coming from [employer]".  When asked with this things are not to stating the behavior is not consistent with her religion.  Patient denies any specific SI, HI, thoughts of hurting herself or others.  She is not on any medications.  She denies abdominal pain, headaches, loss of consciousness, dysuria, hematuria.    Home Medications Prior to Admission medications   Medication Sig Start Date End Date Taking? Authorizing Provider  cephALEXin (KEFLEX) 500 MG capsule Take 1 capsule (500 mg total) by mouth 4 (four) times daily. 10/29/22  Yes Sherrill Raring, PA-C      Allergies    Patient has no known allergies.    Review of Systems   Review of Systems  Physical Exam Updated Vital Signs BP 114/71 (BP Location: Right Arm)   Pulse 65   Temp 97.7 F (36.5 C) (Oral)   Resp 14   LMP 09/08/2012   SpO2 100%  Physical Exam Vitals and nursing note reviewed. Exam conducted with a chaperone present.  Constitutional:      Appearance: Normal appearance.  HENT:     Head: Normocephalic  and atraumatic.  Eyes:     General: No scleral icterus.       Right eye: No discharge.        Left eye: No discharge.     Extraocular Movements: Extraocular movements intact.     Pupils: Pupils are equal, round, and reactive to light.  Cardiovascular:     Rate and Rhythm: Normal rate and regular rhythm.     Pulses: Normal pulses.     Heart sounds: Normal heart sounds. No murmur heard.    No friction rub. No gallop.  Pulmonary:     Effort: Pulmonary effort is normal. No respiratory distress.     Breath sounds: Normal breath sounds.  Abdominal:     General: Abdomen is flat. Bowel sounds are normal. There is no distension.     Palpations: Abdomen is soft.     Tenderness: There is no abdominal tenderness.  Skin:    General: Skin is warm and dry.     Coloration: Skin is not jaundiced.  Neurological:     Mental Status: She is alert. Mental status is at baseline.     Coordination: Coordination normal.  Psychiatric:        Attention and Perception: Attention normal.        Mood and Affect: Mood and affect normal.        Speech: Speech normal.  Behavior: Behavior normal. Behavior is cooperative.        Thought Content: Thought content is paranoid. Thought content does not include homicidal or suicidal ideation. Thought content does not include homicidal or suicidal plan.        Cognition and Memory: Cognition and memory normal.        Judgment: Judgment normal.     ED Results / Procedures / Treatments   Labs (all labs ordered are listed, but only abnormal results are displayed) Labs Reviewed  COMPREHENSIVE METABOLIC PANEL - Abnormal; Notable for the following components:      Result Value   Potassium 3.2 (*)    Creatinine, Ser 1.02 (*)    All other components within normal limits  URINALYSIS, ROUTINE W REFLEX MICROSCOPIC - Abnormal; Notable for the following components:   Color, Urine AMBER (*)    APPearance CLOUDY (*)    Specific Gravity, Urine 1.031 (*)    Protein, ur  100 (*)    Leukocytes,Ua LARGE (*)    Bacteria, UA MANY (*)    Non Squamous Epithelial 0-5 (*)    All other components within normal limits  CBC WITH DIFFERENTIAL/PLATELET  RAPID URINE DRUG SCREEN, HOSP PERFORMED  ETHANOL  SEDIMENTATION RATE  TSH  RPR    EKG None  Radiology CT Head Wo Contrast  Result Date: 10/28/2022 CLINICAL DATA:  Confusion, altered mental status EXAM: CT HEAD WITHOUT CONTRAST TECHNIQUE: Contiguous axial images were obtained from the base of the skull through the vertex without intravenous contrast. RADIATION DOSE REDUCTION: This exam was performed according to the departmental dose-optimization program which includes automated exposure control, adjustment of the mA and/or kV according to patient size and/or use of iterative reconstruction technique. COMPARISON:  01/14/2018 FINDINGS: Brain: No acute intracranial findings are seen. There are no signs of bleeding within the cranium. Ventricles are not dilated. Cavum septum pellucidum and cavum septum vergae are seen. Vascular: Unremarkable. Skull: Unremarkable. Sinuses/Orbits: Unremarkable. Other: None. IMPRESSION: No acute intracranial findings are seen in noncontrast CT brain. Electronically Signed   By: Elmer Picker M.D.   On: 10/28/2022 17:30    Procedures Procedures    Medications Ordered in ED Medications - No data to display  ED Course/ Medical Decision Making/ A&P Clinical Course as of 10/29/22 1328  Wed Oct 29, 2022  1247 Patient's brother is medical doctor requesting we add on ESR, TSH and RPR to laboratory work-up. [HS]    Clinical Course User Index [HS] Sherrill Raring, PA-C                           Medical Decision Making Amount and/or Complexity of Data Reviewed Labs: ordered.  Risk Prescription drug management.   This is a 56 year old female presenting to the emergency department for evaluation for any behavioral changes and paranoia., this involves an extensive number of treatment  options, and is a complaint that carries with it a high risk of complications and morbidity.  The differential diagnosis includes Psychiatric condition, infection, metabolic abnormality, acute intracranial process.   Additional history obtained:   Independent historian: Patient's husband, sister    Lab Tests:  I ordered, viewed, and personally interpreted labs.  The pertinent results include:   -CBC without leukocytosis or anemia. -CMP with Mildly decreased potassium at 3.2.  No gross electrolyte abnormality, AKI or transaminitis. -UA with moderate leukocytes consistent with an underlying UTI. -UDS is negative. RPR, TSH and ESR pending.    Imaging  Studies ordered:  I directly visualized the CT Head, which showed no acute process  I agree with the radiologist interpretation    Medicines ordered and prescription drug management:  I ordered medication including: keflex (outpatient)    I have reviewed the patients home medicines and have made adjustments as needed   Test Considered:  Considered admission for TTS evaluation.  However patient is of sound mind, meets no IVC criteria and is requesting to leave and follow-up outpatient.  She has great support with her husband and her sister, also her borther is a medical doctor and helpful. I think that is reasonable to follow up outpatient.  She has expressed repeatedly no intentions to hurt self or others and is contracted safety.  She is of sound mind although she is having some paranoid ideation she was on express any ideas of violence or self-harm.   Reevaluation:  After the interventions noted above, I reevaluated the patient and found no changes   Problems addressed / ED Course: -BP 114/71 (BP Location: Right Arm)   Pulse 65   Temp 97.7 F (36.5 C) (Oral)   Resp 14   LMP 09/08/2012   SpO2 100%   Laboratory work-up is reassuring, no signs of congestive encephalopathy, acute intracranial process or metabolic  abnormality to explain her recent behavioral changes.  She is also not intoxicated and mentation is appropriate.  Urine is notable for UTI which we will treat with Keflex.  I considered TTS evaluation but patient is adamant in her desire to follow-up outpatient and does not want to stay in the hospital any further.  We discussed return precautions as well as plan for outpatient follow-up to which she verbalized understanding.   Social Determinants of Health: PCP, outpatient support.    Disposition:   After consideration of the diagnostic results and the patients response to treatment, I feel that the patent would benefit from close outpatient follow-up with psychiatry and her PCP with strict return precautions..    Case discussed with my attending who agrees with this plan.        Final Clinical Impression(s) / ED Diagnoses Final diagnoses:  Paranoia ([place])  Acute cystitis without hematuria    Rx / DC Orders ED Discharge Orders          Ordered    cephALEXin (KEFLEX) 500 MG capsule  4 times daily        10/29/22 1247              Sherrill Raring, PA-C 10/29/22 1328    Valarie Merino, MD 10/29/22 [phone]

## 2022-10-29 NOTE — Discharge Instructions (Addendum)
You were seen in the emergency department for irregular behavior.  I discussed work-up is very reassuring.  Potassium is mildly decreased at 3.2, increase leafy greens and bananas in your diet.  Take the antibiotic 4 times daily with food and water, you can also take 1000 mg twice daily for 5 days.  If you have any thoughts of hurting self or others go to the [facility] the emergency department.  Return to the ED for new or concerning symptoms. Check results of lab send off on mychart. Follow up with your main doctor for results.

## 2022-10-30 LAB — RPR: RPR Ser Ql: NONREACTIVE

## 2022-11-07 ENCOUNTER — Encounter (HOSPITAL_COMMUNITY): Payer: Self-pay | Admitting: Emergency Medicine

## 2022-11-07 ENCOUNTER — Other Ambulatory Visit: Payer: Self-pay

## 2022-11-07 ENCOUNTER — Ambulatory Visit (HOSPITAL_COMMUNITY)
Admission: EM | Admit: 2022-11-07 | Discharge: 2022-11-08 | Disposition: A | Discharge status: Other Acute Inpatient Hospital | Payer: No Typology Code available for payment source | Attending: Urology | Admitting: Urology

## 2022-11-07 DIAGNOSIS — F32A Depression, unspecified: Secondary | ICD-10-CM | POA: Insufficient documentation

## 2022-11-07 DIAGNOSIS — F23 Brief psychotic disorder: Secondary | ICD-10-CM | POA: Diagnosis present

## 2022-11-07 DIAGNOSIS — Z1152 Encounter for screening for COVID-19: Secondary | ICD-10-CM | POA: Diagnosis not present

## 2022-11-07 LAB — POCT PREGNANCY, URINE: Preg Test, Ur: NEGATIVE

## 2022-11-07 LAB — POC SARS CORONAVIRUS 2 AG: SARSCOV2ONAVIRUS 2 AG: NEGATIVE

## 2022-11-07 MED ORDER — ALUM & MAG HYDROXIDE-SIMETH 200-200-20 MG/5ML PO SUSP: 30.0000 mL | ORAL | Status: DC | PRN

## 2022-11-07 MED ORDER — MAGNESIUM HYDROXIDE 400 MG/5ML PO SUSP: 30.0000 mL | Freq: Every day | ORAL | Status: DC | PRN

## 2022-11-07 MED ORDER — TRAZODONE HCL 50 MG PO TABS: 50.0000 mg | ORAL_TABLET | Freq: Every evening | ORAL | Status: DC | PRN

## 2022-11-07 MED ORDER — HYDROXYZINE HCL 25 MG PO TABS: 25.0000 mg | ORAL_TABLET | Freq: Three times a day (TID) | ORAL | Status: DC | PRN

## 2022-11-07 MED ORDER — ACETAMINOPHEN 325 MG PO TABS: 650.0000 mg | ORAL_TABLET | Freq: Four times a day (QID) | ORAL | Status: DC | PRN
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
## 2022-11-07 NOTE — ED Notes (Signed)
Pt A&O x 4, presents with psychosis and difficulty sleeping.  Pt refusing sleep meds at present.  Pt reports she hears God's Voice.  Pt calm & cooperative at present, no distress noted.  Monitoring for safety.
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
## 2022-11-08 DIAGNOSIS — F23 Brief psychotic disorder: Secondary | ICD-10-CM | POA: Diagnosis not present

## 2022-11-08 LAB — COMPREHENSIVE METABOLIC PANEL
ALT: 22 U/L (ref 0–44)
AST: 28 U/L (ref 15–41)
Albumin: 4.7 g/dL (ref 3.5–5.0)
Alkaline Phosphatase: 39 U/L (ref 38–126)
Anion gap: 14 (ref 5–15)
BUN: 15 mg/dL (ref 6–20)
CO2: 23 mmol/L (ref 22–32)
Calcium: 10.3 mg/dL (ref 8.9–10.3)
Chloride: 104 mmol/L (ref 98–111)
Creatinine, Ser: 0.8 mg/dL (ref 0.44–1.00)
GFR, Estimated: >60 mL/min (ref >60)
Glucose, Bld: 70 mg/dL (ref 70–99)
Potassium: 4.1 mmol/L (ref 3.5–5.1)
Sodium: 141 mmol/L (ref 135–145)
Total Bilirubin: 0.8 mg/dL (ref 0.3–1.2)
Total Protein: 7.9 g/dL (ref 6.5–8.1)

## 2022-11-08 LAB — URINALYSIS, COMPLETE (UACMP) WITH MICROSCOPIC
Bacteria, UA: NONE SEEN
Bilirubin Urine: NEGATIVE
Glucose, UA: NEGATIVE mg/dL
Hgb urine dipstick: NEGATIVE
Ketones, ur: 80 mg/dL — AB
Nitrite: NEGATIVE
Protein, ur: NEGATIVE mg/dL
Specific Gravity, Urine: 1.025 (ref 1.005–1.030)
pH: 5 (ref 5.0–8.0)

## 2022-11-08 LAB — RESP PANEL BY RT-PCR (FLU A&B, COVID) ARPGX2
Influenza A by PCR: NEGATIVE
Influenza B by PCR: NEGATIVE
SARS Coronavirus 2 by RT PCR: NEGATIVE

## 2022-11-08 LAB — POCT URINE DRUG SCREEN - MANUAL ENTRY (I-SCREEN)
POC Amphetamine UR: NOT DETECTED
POC Buprenorphine (BUP): NOT DETECTED
POC Cocaine UR: NOT DETECTED
POC Marijuana UR: NOT DETECTED
POC Methadone UR: NOT DETECTED
POC Methamphetamine UR: NOT DETECTED
POC Morphine: NOT DETECTED
POC Oxazepam (BZO): NOT DETECTED
POC Oxycodone UR: NOT DETECTED
POC Secobarbital (BAR): NOT DETECTED

## 2022-11-08 LAB — LIPID PANEL
Cholesterol: 240 mg/dL — ABNORMAL HIGH (ref 0–200)
HDL: 105 mg/dL (ref >40)
LDL Cholesterol: 128 mg/dL — ABNORMAL HIGH (ref 0–99)
Total CHOL/HDL Ratio: 2.3 RATIO
Triglycerides: 36 mg/dL (ref <150)
VLDL: 7 mg/dL (ref 0–40)

## 2022-11-08 LAB — CBC WITH DIFFERENTIAL/PLATELET
Abs Immature Granulocytes: 0.02 10*3/uL (ref 0.00–0.07)
Basophils Absolute: 0 10*3/uL (ref 0.0–0.1)
Basophils Relative: 1 %
Eosinophils Absolute: 0.5 10*3/uL (ref 0.0–0.5)
Eosinophils Relative: 7 %
HCT: 44.8 % (ref 36.0–46.0)
Hemoglobin: 14.6 g/dL (ref 12.0–15.0)
Immature Granulocytes: 0 %
Lymphocytes Relative: 39 %
Lymphs Abs: 2.6 10*3/uL (ref 0.7–4.0)
MCH: 31.2 pg (ref 26.0–34.0)
MCHC: 32.6 g/dL (ref 30.0–36.0)
MCV: 95.7 fL (ref 80.0–100.0)
Monocytes Absolute: 0.5 10*3/uL (ref 0.1–1.0)
Monocytes Relative: 7 %
Neutro Abs: 3 10*3/uL (ref 1.7–7.7)
Neutrophils Relative %: 46 %
Platelets: 282 10*3/uL (ref 150–400)
RBC: 4.68 MIL/uL (ref 3.87–5.11)
RDW: 13.9 % (ref 11.5–15.5)
WBC: 6.6 10*3/uL (ref 4.0–10.5)
nRBC: 0 % (ref 0.0–0.2)

## 2022-11-08 LAB — ETHANOL: Alcohol, Ethyl (B): <10 mg/dL (ref <10)

## 2022-11-08 LAB — TSH: TSH: 4.152 u[IU]/mL (ref 0.350–4.500)

## 2022-11-08 MED ORDER — CEPHALEXIN 500 MG PO CAPS: 500.0000 mg | ORAL_CAPSULE | Freq: Two times a day (BID) | ORAL | Status: DC

## 2022-11-08 MED ORDER — QUETIAPINE FUMARATE 25 MG PO TABS
25.0000 mg | ORAL_TABLET | Freq: Every day | ORAL | Status: DC
  Administered 2022-11-08: 25 mg via ORAL
  Filled 2022-11-08: qty 1

## 2022-11-08 MED ORDER — CEPHALEXIN 250 MG PO CAPS: 500.0000 mg | ORAL_CAPSULE | Freq: Two times a day (BID) | ORAL | Status: DC

## 2022-11-08 MED ORDER — PALIPERIDONE ER 3 MG PO TB24: 3.0000 mg | ORAL_TABLET | Freq: Every day | ORAL | Status: DC
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
## 2022-11-08 NOTE — Discharge Instructions (Addendum)
Transfer to [facility] For IP psychiatric admission. Dr. Berna Spare accepting MD

## 2022-11-08 NOTE — ED Provider Notes (Signed)
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 and evaluate symptoms and progress in treatment and Medication management.   Keflex 500 mg Q12H x 7 days.  Due to suspected UTI-of note patient was diagnosed with UTI on 10/28/2022.  At that time patient presented with bizarre behavior and paranoia.   Discontinue Seroquel.  Start Invega 3 mg daily for Psychosis  Revonda Humphrey,  NP 11/08/2022 1:08 PM

## 2022-11-08 NOTE — Progress Notes (Signed)
Per [place], pt has been accepted to [facility], main campus. Accepting provider is Dr. Estill Cotta. Patient can arrive anytime. Number for report is [phone].   Crissie Reese, MSW, LCSW-A Phone: [phone] Disposition/TOC

## 2022-11-08 NOTE — ED Notes (Signed)
Patient denies SI/HI and AVH. Patient refused breakfast she is drinking water. Patient is resting reading. Patient is being monitored for safety.

## 2022-11-08 NOTE — Progress Notes (Signed)
Per Vernard Gambles, NP, patient meets criteria for inpatient treatment. There are no available beds at [facility] today. CSW faxed referrals to the following facilities for review:  [facility]  Pending - Request Sent N/A [address], [place] [address] [phone] --  [facility]  Pending - Request Sent N/A [address] [place] [address] [phone] --  [facility]  Pending - Request Sent N/A [facility] [address] [place] [address] [phone] --  [facility]  Pending - Request Sent N/A [address] [place] [address] [phone] --  [facility]  Pending - Request Sent N/A [address], [place] [address] [phone] --  [facility]  Pending - Request Sent N/A [address] [place] [address] [phone] --  [facility]  Pending - Request Sent N/A [address] [place] [address] [phone] --  [facility]  Pending - Request Sent N/A [address], [place] [address] [phone] --  [facility]  Pending - Request Sent N/A [address] [place] [address] [phone] --  [facility]  Pending - Request Sent N/A [address], [place] [address] [phone] --  [facility]  Pending - Request Sent N/A [address], [place] [address] [phone] --  [facility]  Pending - Request Sent N/A [address] [place] [address] [phone] --   [facility] will continue to seek bed placement.  Crissie Reese, MSW, Lenice Pressman Phone: [phone] Disposition/TOC

## 2022-11-08 NOTE — ED Notes (Signed)
Patient walked to safe transport vehicle with no sxs of distress noted

## 2022-11-08 NOTE — ED Notes (Signed)
Pt sleeping at present, no distress noted.  Monitoring for safety. 

## 2022-11-08 NOTE — ED Notes (Signed)
Patient has been eating and watching television. Patient has been calm and cooperative. Patient denies SI/HI and AVH. Patient will be transported via [facility] to [facility]. Patient's husband was called to request clothing. Writer spoke with the admission nurse [place].

## 2022-11-08 NOTE — ED Notes (Signed)
Patient has been calm and cooperative ate lunch and has been sitting in a chair reading.

## 2022-11-09 LAB — PROLACTIN: Prolactin: 12.7 ng/mL (ref 4.8–23.3)

## 2022-11-10 LAB — HEMOGLOBIN A1C
Hgb A1c MFr Bld: 5.8 % — ABNORMAL HIGH (ref 4.8–5.6)
Mean Plasma Glucose: 120 mg/dL

## 2022-11-11 ENCOUNTER — Ambulatory Visit (HOSPITAL_COMMUNITY)
Admission: EM | Admit: 2022-11-11 | Discharge: 2022-11-11 | Disposition: A | Discharge status: Home / Self Care | Payer: No Typology Code available for payment source | Source: Ambulatory Visit | Attending: Emergency Medicine | Admitting: Emergency Medicine

## 2022-11-11 ENCOUNTER — Encounter (HOSPITAL_BASED_OUTPATIENT_CLINIC_OR_DEPARTMENT_OTHER): Payer: Self-pay

## 2022-11-11 ENCOUNTER — Emergency Department (HOSPITAL_BASED_OUTPATIENT_CLINIC_OR_DEPARTMENT_OTHER)
Admission: EM | Admit: 2022-11-11 | Discharge: 2022-11-11 | Disposition: A | Discharge status: Home / Self Care | Payer: No Typology Code available for payment source | Attending: Emergency Medicine | Admitting: Emergency Medicine

## 2022-11-11 DIAGNOSIS — Z0441 Encounter for examination and observation following alleged adult rape: Secondary | ICD-10-CM | POA: Insufficient documentation

## 2022-11-11 DIAGNOSIS — F22 Delusional disorders: Secondary | ICD-10-CM | POA: Insufficient documentation

## 2022-11-11 DIAGNOSIS — T7421XA Adult sexual abuse, confirmed, initial encounter: Secondary | ICD-10-CM

## 2022-11-11 HISTORY — DX: Unspecified asthma, uncomplicated: J45.909

## 2022-11-11 LAB — COMPREHENSIVE METABOLIC PANEL
ALT: 28 U/L (ref 0–44)
AST: 27 U/L (ref 15–41)
Albumin: 4.4 g/dL (ref 3.5–5.0)
Alkaline Phosphatase: 43 U/L (ref 38–126)
Anion gap: 13 (ref 5–15)
BUN: 15 mg/dL (ref 6–20)
CO2: 25 mmol/L (ref 22–32)
Calcium: 9.6 mg/dL (ref 8.9–10.3)
Chloride: 102 mmol/L (ref 98–111)
Creatinine, Ser: 0.83 mg/dL (ref 0.44–1.00)
GFR, Estimated: >60 mL/min (ref >60)
Glucose, Bld: 84 mg/dL (ref 70–99)
Potassium: 3.6 mmol/L (ref 3.5–5.1)
Sodium: 140 mmol/L (ref 135–145)
Total Bilirubin: 0.7 mg/dL (ref 0.3–1.2)
Total Protein: 7.9 g/dL (ref 6.5–8.1)

## 2022-11-11 LAB — URINALYSIS, ROUTINE W REFLEX MICROSCOPIC
Glucose, UA: NEGATIVE mg/dL
Ketones, ur: 40 mg/dL — AB
Nitrite: NEGATIVE
Protein, ur: 100 mg/dL — AB
Specific Gravity, Urine: >=1.03 (ref 1.005–1.030)
pH: 6 (ref 5.0–8.0)

## 2022-11-11 LAB — RAPID HIV SCREEN (HIV 1/2 AB+AG)
HIV 1/2 Antibodies: NONREACTIVE
HIV-1 P24 Antigen - HIV24: NONREACTIVE

## 2022-11-11 LAB — URINALYSIS, MICROSCOPIC (REFLEX)

## 2022-11-11 LAB — PREGNANCY, URINE: Preg Test, Ur: NEGATIVE

## 2022-11-11 MED ORDER — METRONIDAZOLE 500 MG PO TABS
2000.0000 mg | ORAL_TABLET | Freq: Once | ORAL | Status: AC
  Administered 2022-11-11: 2000 mg via ORAL

## 2022-11-11 MED ORDER — ELVITEG-COBIC-EMTRICIT-TENOFAF 150-150-200-10 MG PO TABS
1.0000 | ORAL_TABLET | Freq: Every day | ORAL | 0 refills | Status: DC
  Filled 2022-11-12 (×2): qty 30, 30d supply, fill #0

## 2022-11-11 MED ORDER — AZITHROMYCIN 250 MG PO TABS
1000.0000 mg | ORAL_TABLET | Freq: Once | ORAL | Status: AC
  Administered 2022-11-11: 1000 mg via ORAL

## 2022-11-11 MED ORDER — ELVITEG-COBIC-EMTRICIT-TENOFAF 150-150-200-10 MG PREPACK
1.0000 | ORAL_TABLET | Freq: Once | ORAL | Status: AC
  Administered 2022-11-11: 1 via ORAL
  Filled 2022-11-11: qty 1

## 2022-11-11 MED ORDER — PROMETHAZINE HCL 25 MG PO TABS
25.0000 mg | ORAL_TABLET | Freq: Four times a day (QID) | ORAL | Status: DC | PRN
  Administered 2022-11-11: 25 mg via ORAL

## 2022-11-11 MED ORDER — LIDOCAINE HCL (PF) 1 % IJ SOLN
1.0000 mL | Freq: Once | INTRAMUSCULAR | Status: AC
  Administered 2022-11-11: 2 mL

## 2022-11-11 MED ORDER — CEFTRIAXONE SODIUM 500 MG IJ SOLR
500.0000 mg | Freq: Once | INTRAMUSCULAR | Status: AC
  Administered 2022-11-11: 500 mg via INTRAMUSCULAR

## 2022-11-11 NOTE — ED Notes (Signed)
ED Provider at bedside. 

## 2022-11-11 NOTE — Discharge Instructions (Addendum)
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 If it is safe to put it in the trash, take the medication out of the container. Mix the medication with cat litter, dirt, coffee grounds, or other unwanted substance. Seal the mixture in a bag or container. Put it in the trash. NOTE: This sheet is a summary. It may not cover all possible information. If you have questions about this medicine, talk to your  doctor, pharmacist, or health care provider.  2022 Elsevier/Gold Standard (2021-01-24 13:29:17)  Elvitegravir; Cobicistat; Emtricitabine; Tenofovir Alafenamide oral tablets  What is this medication? ELVITEGRAVIR; COBICISTAT; EMTRICITABINE; TENOFOVIR ALAFENAMIDE (el vye TEG ra veer; koe BIS i stat; em tri SIT uh bean; te NOE fo veer) is 3 antiretroviral medicines and a medication booster in 1 tablet. It is used to treat HIV. This medicine is not a cure for HIV.  This medicine can lower, but not fully prevent, the risk of spreading HIV to others. This medicine may be used for other purposes; ask your health care provider or pharmacist if you have questions. COMMON BRAND NAME(S): Genvoya What should I tell my care team before I take this medication? They need to know if you have any of these conditions: kidney disease liver disease an unusual or allergic reaction to elvitegravir, cobicistat, emtricitabine, tenofovir, other medicines, foods, dyes, or preservatives pregnant or trying to get pregnant breast-feeding How should I use this medication? Take this medicine by mouth with a glass of water. Follow the directions on the prescription label. Take this medicine with food. Take your medicine at regular intervals. Do not take your medicine more often than directed. For your anti-HIV therapy to work as well as possible, take each dose exactly as prescribed. Do not skip doses or stop your medicine even if you feel better. Skipping doses may make the HIV virus resistant to this medicine and other medicines. Do not stop taking except on your doctor's advice. Talk to your pediatrician regarding the use of this medicine in children. While this drug may be prescribed for selected conditions, precautions do apply. Overdosage: If you think you have taken too much of this medicine contact a poison control center or emergency room at once. NOTE: This medicine is only for you. Do not share this medicine with others. What if I miss a dose? If you miss a dose, take it as soon as you can. If it is almost time for your next dose, take only that dose. Do not take double or extra doses. What may interact with this medication? Do not take this medicine with any of the following medications: adefovir alfuzosin certain medicines for seizures like carbamazepine, phenobarbital, phenytoin cisapride irinotecan lumacaftor; ivacaftor lurasidone medicines for cholesterol like  lovastatin, simvastatin medicines for headaches like dihydroergotamine, ergotamine, methylergonovine midazolam naloxegol other antiviral medicines for HIV or AIDS pimozide rifampin sildenafil [facility] John's wort triazolam This medicine may also interact with the following medications: antacids atorvastatin bosentan buprenorphine; naloxone certain antibiotics like clarithromycin, telithromycin, rifabutin, rifapentine certain medications for anxiety or sleep like buspirone, clorazepate, diazepam, estazolam, flurazepam, zolpidem certain medicines for blood pressure or heart disease like amlodipine, diltiazem, felodipine, metoprolol, nicardipine, nifedipine, timolol, verapamil certain medicines for depression, anxiety, or psychiatric disturbances certain medicines for erectile dysfunction like avanafil, sildenafil, tadalafil, vardenafil certain medicines for fungal infection like itraconazole, ketoconazole, voriconazole certain medicines that treat or prevent blood clots like warfarin, apixaban, betrixaban, dabigatran, edoxaban, and rivaroxaban colchicine cyclosporine female hormones, like estrogens and progestins and birth control pills medicines for infection like acyclovir, cidofovir, valacyclovir, ganciclovir, valganciclovir medicines for irregular heart beat like amiodarone, bepridil, digoxin, disopyramide, dofetilide, flecainide, lidocaine, mexiletine, propafenone, quinidine metformin oxcarbazepine phenothiazines like perphenazine, risperidone, thioridazine salmeterol sirolimus steroid medicines like betamethasone, budesonide, ciclesonide, dexamethasone, fluticasone, methylprednisolone, mometasone, triamcinolone tacrolimus This list may not describe all possible interactions. Give your health care provider a list of all the medicines, herbs, non-prescription drugs, or dietary supplements you use. Also tell them if  you smoke, drink alcohol, or use illegal drugs. Some items may interact  with your medicine. What should I watch for while using this medication? Visit your doctor or health care professional for regular check ups. Discuss any new symptoms with your doctor. You will need to have important blood work done while on this medicine. HIV is spread to others through sexual or blood contact. Talk to your doctor about how to stop the spread of HIV. If you have hepatitis B, talk to your doctor if you plan to stop this medicine. The symptoms of hepatitis B may get worse if you stop this medicine. Birth control pills may not work properly while you are taking this medicine. Talk to your doctor about using an extra method of birth control. Women who can still have children must use a reliable form of barrier contraception, like a condom. What side effects may I notice from receiving this medication? Side effects that you should report to your doctor or health care professional as soon as possible: allergic reactions like skin rash, itching or hives, swelling of the face, lips, or tongue breathing problems fast, irregular heartbeat muscle pain or weakness signs and symptoms of kidney injury like trouble passing urine or change in the amount of urine signs and symptoms of liver injury like dark yellow or brown urine; general ill feeling or flu-like symptoms; light-colored stools; loss of appetite; right upper belly pain; unusually weak or tired; yellowing of the eyes or skin Side effects that usually do not require medical attention (report to your doctor or health care professional if they continue or are bothersome): diarrhea headache nausea tiredness This list may not describe all possible side effects. Call your doctor for medical advice about side effects. You may report side effects to FDA at 1-800-FDA-1088. Where should I keep my medication? Keep out of the reach of children. Store at room temperature below 30 degrees C (86 degrees F). Throw away any unused medicine after  the expiration date. NOTE: This sheet is a summary. It may not cover all possible information. If you have questions about this medicine, talk to your doctor, pharmacist, or health care provider.  2022 Elsevier/Gold Standard (2019-11-01 17:54:51)   Promethazine (pack of 3 for home use) Also known as:  Phenergan  Promethazine Tablets  What is this medication? PROMETHAZINE (proe METH a zeen) prevents and treats the symptoms of an allergic reaction. It works by blocking histamine, a substance released by the body during an allergic reaction. It may also help you relax, go to sleep, and relieve nausea, vomiting, or pain before or after procedures. It can also prevent and treat motion sickness. It works by helping your nervous system calm down by blocking substances in the body that may cause nausea and vomiting. It belongs to a group of medications called antihistamines. This medicine may be used for other purposes; ask your health care provider or pharmacist if you have questions. COMMON BRAND NAME(S): Phenergan What should I tell my care team before I take this medication? They need to know if you have any of these conditions: Blockage in your bowel Diabetes Glaucoma Have trouble controlling your muscles Heart disease Liver disease Low blood counts, like low white cell, platelet, or red cell counts Lung or breathing disease, like asthma Parkinson's disease Prostate disease Seizures Stomach or intestine problems Trouble passing urine An unusual or allergic reaction to promethazine, sulfites, other medications, foods, dyes, or preservatives Pregnant or trying to get pregnant Breast-feeding How should I  use this medication? Take this medication by mouth with a glass of water. Follow the directions on the prescription label. Take your doses at regular intervals. Do not take your medication more often than directed. Talk to your care team about the use of this medication in children.  Special care may be needed. This medication should not be given to infants and children younger than 51 years old. Overdosage: If you think you have taken too much of this medicine contact a poison control center or emergency room at once. NOTE: This medicine is only for you. Do not share this medicine with others. What if I miss a dose? If you miss a dose, take it as soon as you can. If it is almost time for your next dose, take only that dose. Do not take double or extra doses. What may interact with this medication? Alcohol Antihistamines for allergy, cough, and cold Atropine Certain medications for anxiety or sleep Certain medications for bladder problems like oxybutynin, tolterodine Certain medications for depression like amitriptyline, fluoxetine, sertraline Certain medications for Parkinson's disease like benztropine, trihexyphenidyl Certain medications for stomach problems like dicyclomine, hyoscyamine Certain medications for travel sickness like scopolamine Epinephrine General anesthetics like halothane, isoflurane, methoxyflurane, propofol Ipratropium MAOIs like Marplan, Nardil, and Parnate Medications for high blood pressure Medications for seizures like phenobarbital, primidone, phenytoin Medications that relax muscles for surgery Metoclopramide Narcotic medications for pain This list may not describe all possible interactions. Give your health care provider a list of all the medicines, herbs, non-prescription drugs, or dietary supplements you use. Also tell them if you smoke, drink alcohol, or use illegal drugs. Some items may interact with your medicine. What should I watch for while using this medication? Visit your care team for regular checks on your progress. Tell your care team if symptoms do not start to get better or if they get worse. You may get drowsy or dizzy. Do not drive, use machinery, or do anything that needs mental alertness until you know how this medication  affects you. To reduce the risk of dizzy or fainting spells, do not stand or sit up quickly, especially if you are an older patient. Alcohol may increase dizziness and drowsiness. Avoid alcoholic drinks. Your mouth may get dry. Chewing sugarless gum or sucking hard candy, and drinking plenty of water may help. Contact your care team if the problem does not go away or is severe. This medication may cause dry eyes and blurred vision. If you wear contact lenses you may feel some discomfort. Lubricating drops may help. See your care team if the problem does not go away or is severe. This medication can make you more sensitive to the sun. Keep out of the sun. If you cannot avoid being in the sun, wear protective clothing and use sunscreen. Do not use sun lamps or tanning beds/booths. This medication may increase blood sugar. Ask your care team if changes in diet or medications are needed if you have diabetes. What side effects may I notice from receiving this medication? Side effects that you should report to your care team as soon as possible: Allergic reactions-skin rash, itching, hives, swelling of the face, lips, tongue, or throat CNS depression-slow or shallow breathing, shortness of breath, feeling faint, dizziness, confusion, trouble staying awake High fever stiff muscles, increased sweating, fast or irregular heartbeat, and confusion, which may be signs of neuroleptic malignant syndrome Infection-fever, chills, cough, or sore throat Liver injury-right upper belly pain, loss of appetite, nausea, light-colored stool, dark  yellow or brown urine, yellowing skin or eyes, unusual weakness or fatigue Seizures Sudden eye pain or change in vision such as blurry vision, seeing halos around lights, vision loss Trouble passing urine Uncontrolled and repetitive body movements, muscle stiffness or spasms, tremors or shaking, loss of balance or coordination, restlessness, shuffling walk, which may be signs of  extrapyramidal symptoms (EPS) Side effects that usually do not require medical attention (report to your care team if they continue or are bothersome): Confusion Constipation Dizziness Drowsiness Dry mouth Sensitivity to light Vivid dreams or nightmares This list may not describe all possible side effects. Call your doctor for medical advice about side effects. You may report side effects to FDA at 1-800-FDA-1088. Where should I keep my medication? Keep out of the reach of children. Store at room temperature, between 20 and 25 degrees C (68 and 77 degrees F). Protect from light. Throw away any unused medication after the expiration date. NOTE: This sheet is a summary. It may not cover all possible information. If you have questions about this medicine, talk to your doctor, pharmacist, or health care provider.  2022 Elsevier/Gold Standard (2021-03-12 10:57:27)
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
## 2022-11-11 NOTE — ED Notes (Signed)
SANE is at bedside
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
## 2022-11-12 ENCOUNTER — Other Ambulatory Visit (HOSPITAL_COMMUNITY): Payer: Self-pay

## 2022-11-12 LAB — RPR: RPR Ser Ql: NONREACTIVE

## 2022-11-12 LAB — HEPATITIS B SURFACE ANTIGEN: Hepatitis B Surface Ag: NONREACTIVE

## 2022-11-12 LAB — HEPATITIS C ANTIBODY: HCV Ab: NONREACTIVE

## 2022-11-12 LAB — HIV ANTIBODY (ROUTINE TESTING W REFLEX): HIV Screen 4th Generation wRfx: NONREACTIVE

## 2022-11-12 NOTE — SANE Note (Signed)
Advancing Access Coupon Copay:  Rx BIN: [id number] Rx PCN: ACCESS RxGRP: [id number] Issuer: [id number]) ID: [id number]

## 2022-11-12 NOTE — SANE Note (Signed)
11/12/2022 at 0932: T/C to patient at number listed in chart. No answer. Forwarded to voicemail that had no patient identifiers. No message left at this time.   11/12/2022 at 1130: T/C to patient at number listed in chart. No answer. Forwarded to voicemail that had no patient identifiers. No message left at this time.   11/12/2022 at 1330: T/C to patient at number listed in chart. No answer. Forwarded to voicemail that had no patient identifiers. No message left at this time. T/C to patient's husband. He verified patient's number and advised me to leave my name and number so she could call back. T/C to patient again. Left HIPAA compliant message with my name, [facility] location, and number to return call.
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
## 2022-11-13 NOTE — SANE Note (Signed)
11/13/22 at 1230, I received T/C from patient. Verified name and date of birth. I advised patient of HIV results and informed her she could begin Genvoya. I also informed her that script should arrive today. Patient asked why she needed to take it if HIV test was negative. I informed her that we needed a negative before we provided any type HIV prophylaxis. Patient verbalized understanding. I asked if she had any further questions. She stated that she did not.
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
## 2022-12-12 ENCOUNTER — Encounter (HOSPITAL_COMMUNITY): Payer: Self-pay | Admitting: Student

## 2022-12-12 ENCOUNTER — Ambulatory Visit (INDEPENDENT_AMBULATORY_CARE_PROVIDER_SITE_OTHER)
Admission: EM | Admit: 2022-12-12 | Discharge: 2022-12-15 | Disposition: A | Discharge status: Other Acute Inpatient Hospital | Payer: No Typology Code available for payment source | Source: Home / Self Care

## 2022-12-12 DIAGNOSIS — F2081 Schizophreniform disorder: Secondary | ICD-10-CM | POA: Diagnosis present

## 2022-12-12 DIAGNOSIS — Z1152 Encounter for screening for COVID-19: Secondary | ICD-10-CM | POA: Insufficient documentation

## 2022-12-12 DIAGNOSIS — Z8659 Personal history of other mental and behavioral disorders: Secondary | ICD-10-CM

## 2022-12-12 HISTORY — DX: Personal history of other mental and behavioral disorders: Z86.59

## 2022-12-12 LAB — COMPREHENSIVE METABOLIC PANEL
ALT: 25 U/L (ref 0–44)
AST: 36 U/L (ref 15–41)
Albumin: 4.8 g/dL (ref 3.5–5.0)
Alkaline Phosphatase: 40 U/L (ref 38–126)
Anion gap: 10 (ref 5–15)
BUN: 14 mg/dL (ref 6–20)
CO2: 24 mmol/L (ref 22–32)
Calcium: 10.3 mg/dL (ref 8.9–10.3)
Chloride: 107 mmol/L (ref 98–111)
Creatinine, Ser: 0.94 mg/dL (ref 0.44–1.00)
GFR, Estimated: >60 mL/min (ref >60)
Glucose, Bld: 84 mg/dL (ref 70–99)
Potassium: 4.1 mmol/L (ref 3.5–5.1)
Sodium: 141 mmol/L (ref 135–145)
Total Bilirubin: 0.4 mg/dL (ref 0.3–1.2)
Total Protein: 8.1 g/dL (ref 6.5–8.1)

## 2022-12-12 LAB — RESP PANEL BY RT-PCR (RSV, FLU A&B, COVID)  RVPGX2
Influenza A by PCR: NEGATIVE
Influenza B by PCR: NEGATIVE
Resp Syncytial Virus by PCR: NEGATIVE
SARS Coronavirus 2 by RT PCR: NEGATIVE

## 2022-12-12 LAB — URINALYSIS, ROUTINE W REFLEX MICROSCOPIC
Bilirubin Urine: NEGATIVE
Glucose, UA: NEGATIVE mg/dL
Hgb urine dipstick: NEGATIVE
Ketones, ur: 20 mg/dL — AB
Leukocytes,Ua: NEGATIVE
Nitrite: NEGATIVE
Protein, ur: 100 mg/dL — AB
Specific Gravity, Urine: 1.026 (ref 1.005–1.030)
pH: 6 (ref 5.0–8.0)

## 2022-12-12 LAB — CBC WITH DIFFERENTIAL/PLATELET
Abs Immature Granulocytes: 0.01 10*3/uL (ref 0.00–0.07)
Basophils Absolute: 0 10*3/uL (ref 0.0–0.1)
Basophils Relative: 0 %
Eosinophils Absolute: 0.6 10*3/uL — ABNORMAL HIGH (ref 0.0–0.5)
Eosinophils Relative: 11 %
HCT: 44 % (ref 36.0–46.0)
Hemoglobin: 14.6 g/dL (ref 12.0–15.0)
Immature Granulocytes: 0 %
Lymphocytes Relative: 29 %
Lymphs Abs: 1.6 10*3/uL (ref 0.7–4.0)
MCH: 31.1 pg (ref 26.0–34.0)
MCHC: 33.2 g/dL (ref 30.0–36.0)
MCV: 93.8 fL (ref 80.0–100.0)
Monocytes Absolute: 0.4 10*3/uL (ref 0.1–1.0)
Monocytes Relative: 8 %
Neutro Abs: 3 10*3/uL (ref 1.7–7.7)
Neutrophils Relative %: 52 %
Platelets: 265 10*3/uL (ref 150–400)
RBC: 4.69 MIL/uL (ref 3.87–5.11)
RDW: 14.1 % (ref 11.5–15.5)
WBC: 5.8 10*3/uL (ref 4.0–10.5)
nRBC: 0 % (ref 0.0–0.2)

## 2022-12-12 LAB — POCT URINE DRUG SCREEN - MANUAL ENTRY (I-SCREEN)
POC Amphetamine UR: NOT DETECTED
POC Buprenorphine (BUP): NOT DETECTED
POC Cocaine UR: NOT DETECTED
POC Marijuana UR: NOT DETECTED
POC Methadone UR: NOT DETECTED
POC Methamphetamine UR: NOT DETECTED
POC Morphine: NOT DETECTED
POC Oxazepam (BZO): NOT DETECTED
POC Oxycodone UR: NOT DETECTED
POC Secobarbital (BAR): NOT DETECTED

## 2022-12-12 LAB — TSH: TSH: 3.326 u[IU]/mL (ref 0.350–4.500)

## 2022-12-12 LAB — T4, FREE: Free T4: 0.93 ng/dL (ref 0.61–1.12)

## 2022-12-12 LAB — POC SARS CORONAVIRUS 2 AG: SARSCOV2ONAVIRUS 2 AG: NEGATIVE

## 2022-12-12 LAB — HIV ANTIBODY (ROUTINE TESTING W REFLEX): HIV Screen 4th Generation wRfx: NONREACTIVE

## 2022-12-12 MED ORDER — EPINEPHRINE 0.3 MG/0.3ML IJ SOAJ: 0.3000 mg | INTRAMUSCULAR | Status: DC | PRN

## 2022-12-12 MED ORDER — CALAMINE EX LOTN: 1.0000 | TOPICAL_LOTION | CUTANEOUS | Status: DC | PRN

## 2022-12-12 MED ORDER — PALIPERIDONE ER 3 MG PO TB24
3.0000 mg | ORAL_TABLET | Freq: Every day | ORAL | Status: DC
  Administered 2022-12-12: 3 mg via ORAL
  Filled 2022-12-12: qty 1

## 2022-12-12 MED ORDER — ACETAMINOPHEN 325 MG PO TABS: 650.0000 mg | ORAL_TABLET | Freq: Four times a day (QID) | ORAL | Status: DC | PRN

## 2022-12-12 MED ORDER — WHITE PETROLATUM EX OINT: TOPICAL_OINTMENT | CUTANEOUS | Status: DC | PRN

## 2022-12-12 MED ORDER — ALBUTEROL SULFATE HFA 108 (90 BASE) MCG/ACT IN AERS: 1.0000 | INHALATION_SPRAY | Freq: Four times a day (QID) | RESPIRATORY_TRACT | Status: DC | PRN

## 2022-12-12 MED ORDER — ALUM & MAG HYDROXIDE-SIMETH 200-200-20 MG/5ML PO SUSP: 30.0000 mL | ORAL | Status: DC | PRN

## 2022-12-12 MED ORDER — VITAMIN B-12 1000 MCG PO TABS: 1000.0000 ug | ORAL_TABLET | Freq: Every day | ORAL | Status: DC

## 2022-12-12 MED ORDER — ADULT MULTIVITAMIN W/MINERALS CH
1.0000 | ORAL_TABLET | Freq: Every day | ORAL | Status: DC
  Administered 2022-12-13 – 2022-12-15 (×3): 1 via ORAL
  Filled 2022-12-12 (×3): qty 1

## 2022-12-12 MED ORDER — VITAMIN B-12 1000 MCG PO TABS
1000.0000 ug | ORAL_TABLET | Freq: Every day | ORAL | Status: DC
  Administered 2022-12-13 – 2022-12-15 (×3): 1000 ug via ORAL
  Filled 2022-12-12 (×3): qty 1

## 2022-12-12 MED ORDER — LORATADINE 10 MG PO TABS
10.0000 mg | ORAL_TABLET | Freq: Every day | ORAL | Status: DC
  Administered 2022-12-12: 10 mg via ORAL
  Filled 2022-12-12: qty 1

## 2022-12-12 MED ORDER — HYDROXYZINE HCL 25 MG PO TABS
25.0000 mg | ORAL_TABLET | Freq: Three times a day (TID) | ORAL | Status: DC | PRN
  Administered 2022-12-12 – 2022-12-14 (×2): 25 mg via ORAL
  Filled 2022-12-12 (×3): qty 1

## 2022-12-12 MED ORDER — TRAZODONE HCL 50 MG PO TABS
50.0000 mg | ORAL_TABLET | Freq: Once | ORAL | Status: AC | PRN
  Administered 2022-12-12: 50 mg via ORAL
  Filled 2022-12-12: qty 1

## 2022-12-12 MED ORDER — MAGNESIUM HYDROXIDE 400 MG/5ML PO SUSP: 30.0000 mL | Freq: Every day | ORAL | Status: DC | PRN

## 2022-12-12 NOTE — BH Assessment (Signed)
Pt reports she has not slept well in months. Yesterday pt reports sleeping around 2-3 hours and now she is having racing and negative thoughts and feeling anxious. Pt reports she was here on 11/24 and she slept good after taking a little brown pill with the number 25 on it. Pt reports she was transferred to another hospital but she didn't sleep good there either. Pt reports she only took the pill one time and she hopes to get a prescription for the medications to help her sleep better.  Pt denies SI, HI, AVH and substance use. Pt denies feeling paranoid however reports she notices cars with tinted windows on the side of the road but  "that don't mean they are following me". Pt is repeating "focus" and "stop" due to negative thoughts and feeling down about herself.   Pt has a therapy appointment on 12/17/21 however she does not have medication management.

## 2022-12-12 NOTE — ED Notes (Signed)
Pt resting in bed watching TV. A&O x4, calm and cooperative. Denies current SI/HI/AVH. No signs of distress noted. Monitoring for safety.

## 2022-12-12 NOTE — ED Notes (Signed)
Pt asleep in bed. Respirations even and unlabored. Monitoring for safety. 

## 2022-12-12 NOTE — ED Notes (Signed)
Pt had bedtime snack. 

## 2022-12-12 NOTE — ED Provider Notes (Signed)
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 female, with PMH of psychosis, inpatient psych admission, no suicide attempt who presented voluntary to [facility] (12/12/2022) with husband and daughter  for psychosis x6-16mo.   No home rx.  Patient was initially seen in the assessment room with her hands over her ears, head bent over saying "stop". Patient was pleasant and cooperative on interview, however was guarded.  Patient presented to [facility] because she "hasn't been sleeping well, having negative thoughts and needs a little brown pill with the number 25 on it".  Patient adamantly denied that she is paranoid, exhibiting hallucinations, or psychotic. Patient stated that she has been feeling overwhelmed, for unclear reasons.  She reported sleeping about 2-to 3 hours a night, poor months now.  Stated that the [place] she received from [facility] was the only thing that helped her sleep. Patient was initially vague and perseverated on having negative thoughts, which later patient later described as fear of someone is after her to kill her, she mentioned that earlier this week she saw a commercial about organ donation, believed that it was a sinus and was then a steal her organs.  Reported she often has to say "stop" for "focus" to calm her mind of the racing thoughts.  She denied auditory or visual hallucinations, stated that what she hears is her own voice.  She denied these thoughts/voices are encouraging her to hurt herself or other people.  She denies these thoughts are telling her to do anything in particular.  Reported that she feels safe at home despite intermittent  fears that people are after her.  She denied hearing that people are poisoning her, like last time she was admitted.  Patient also reported symptoms of ideas of reference, stated that she receives confirmation of her good deeds from God through the TV and radio.  Stated that currently she is not able to hear God's voice or see visions from him over the past month, which disappoints her.  Patient also spends majority of her time binge watching online sermons for hours (~7hr/d).  Patient reported also having poor appetite, she has lost over 30 pounds within the last few months.   Patient denied depressed mood and pervasive sadness, anhedonia, guilt, decreased energy, and suicidal ideation or intentions. Reported insomnia, decreased concentration, decreased appetite over the past month.  Patient reported feeling anxious, restless, on edge, and overwhelmed.  Stated that her stress is mainly related to not seeing her children and grandchildren, also in the "negative thoughts" per above.  Denies panic attacks.  Patient endorsed some manic symptoms of goal directed activities, increased energy and restlessness despite need for sleep (<2-3 hours a night for months) and racing thoughts.  Reported mild irritability intermittently towards teenage daughter.  Denied grandiosity, sexual discretion, talkativeness, pressured speech.  Patient exhibited and reported psychotic symptoms per above.  Positive for ideas of reference, possible auditory hallucinations, hyperreligious delusions.  Speech and thought process organized.  Denied visual hallucination.  Patient gave verbal permission to obtain collateral and to update care plan to husband Leyana Havron.  Collateral with husband confirmed HPI per above.   [id number]  Thoughts: No [id number] Thoughts: No [id number]: Auditory Ideas of [id number], Paranoia   Mood: Anxious, Dysphoric Sleep:Poor Appetite: Poor   Review of Systems  Constitutional:   Positive for weight loss.  Respiratory:  Negative for shortness of breath.   Cardiovascular:  Negative for chest pain.  Gastrointestinal:  Negative for abdominal pain, constipation, diarrhea, nausea and vomiting.  Musculoskeletal:  Negative for back pain and myalgias.  Skin:  Positive for itching and rash.  Neurological:  Negative for dizziness, tremors and headaches.     Past Psychiatric History:  Dx: "psychosis" no formal dx Rx: denied. Was started on [facility]ga at [facility] during last presentation Outpatient psychiatrist: Denied Outpatient therapist: Yes Inpatient psych admission: Yes 10/2022 presented to [facility], started on invega and transferred to [place] for psychosis. Suicide attempts: Denied  Family Psychiatric History: Denied   Social History: Living: with Husband and daughter Income: Unemployed currently Social support: Husband  Seizure/DT: Dennied  IVDU: Denied EtOH:  has no history on file for alcohol use.  Tobacco:  has no history on file for tobacco use.  Cannabis: Denied Opiates: Denied Stimulants: Denied BZO/hypnotics: Denied  BP 129/77 (BP Location: Right Arm)   Pulse 80   Temp 97.6 F (36.4 C) (Oral)   Resp 18   LMP 09/08/2012   SpO2 100%   Musculoskeletal  Strength & Muscle Tone: within normal limits Gait & Station: normal Patient leans: N/A   Physical Exam Vitals and nursing note reviewed.  Constitutional:      General: She is awake. She is not in acute distress.    Appearance: She is not ill-appearing or diaphoretic.  HENT:     Head: Normocephalic.  Pulmonary:     Effort: Pulmonary effort is normal. No respiratory distress.  Skin:    General: Skin is dry.     Coloration: Skin is not jaundiced.     Findings: Lesion and rash present. No bruising or erythema.  Neurological:     Mental Status: She is alert and oriented to person, place, and time.     Psychiatric Specialty Exam  Presentation  General Appearance:Appropriate for  Environment, Casual, Fairly Groomed Eye Contact:Good Speech:Clear and Coherent, Normal Rate Volume:Decreased Handedness:Right  Mood and Affect  Mood:Anxious, Dysphoric Affect:Congruent, Appropriate, Blunt  Thought Process  Thought Process:Coherent, Goal Directed Descriptions of Associations:Circumstantial  Thought Content Suicidal Thoughts:Suicidal Thoughts: No Homicidal Thoughts:Homicidal Thoughts: No Hallucinations:Hallucinations: Auditory Ideas of Reference:Delusions, Paranoia Thought Content:Illogical, Perseveration, Rumination, Tangential, Delusions  Sensorium  Memory:Immediate Good, Recent Fair Judgment:Fair Insight:Poor  Executive Functions  Orientation:Full (Time, Place and Person) [place] of Knowledge:Good  Psychomotor Activity  Psychomotor Activity:Psychomotor Activity: Restlessness, Increased  Assets  Assets:Communication Skills, Desire for Improvement, Financial Resources/Insurance, Housing, Intimacy, Social Support, Transportation  Sleep  Quality:Poor  Is the patient at risk to self? Yes  Has the patient been a risk to self in the past 6 months? Yes .    Has the patient been a risk to self within the distant past? No   Is the patient a risk to others? No   Has the patient been a risk to others in the past 6 months? No   Has the patient been a risk to others within the distant past? No  Nutritional Assessment (For OBS and FBC admissions only) Has the patient had a weight loss or gain of 10 pounds or more in the last 3 months?: Yes Has the patient had a decrease in food intake/or appetite?: Yes Does the patient have  dental problems?: No Does the patient have eating habits or behaviors that may be indicators of an eating disorder including binging or inducing vomiting?: No Has the patient recently lost weight without trying?: 3 Has the patient been eating poorly because of a decreased appetite?:  1 Malnutrition Screening Tool Score: 4 Nutritional Assessment Referrals: Medication/Tx changes    PHQ 2-9:    Flowsheet Row ED from 12/12/2022 in [facility] ED from 11/11/2022 in [facility] ED from 11/07/2022 in [facility]  C-SSRS RISK CATEGORY No Risk No Risk No Risk        Past Medical History:  Past Medical History:  Diagnosis Date   Asthma    History of admission to inpatient psychiatry department 12/12/2022   [facility] 10/2022   History reviewed. No pertinent surgical history. Family History: History reviewed. No pertinent family history. Social History:  Social History   Socioeconomic History   Marital status: Married    Spouse name: Not on file   Number of children: Not on file   Years of education: Not on file   Highest education level: Not on file  Occupational History   Not on file  Tobacco Use   Smoking status: Unknown   Smokeless tobacco: Not on file  Substance and Sexual Activity   Alcohol use: Not on file   Drug use: Not on file   Sexual activity: Not on file  Other Topics Concern   Not on file  Social History Narrative   Not on file   Social Determinants of Health   Financial Resource Strain: Not on file  Food Insecurity: Not on file  Transportation Needs: Not on file  Physical Activity: Not on file  Stress: Not on file  Social Connections: Not on file  Intimate Partner Violence: Not on file   SDOH:   Last Labs:  Admission on 12/12/2022  Component Date Value Ref Range Status   SARS Coronavirus 2 by RT PCR 12/12/2022 NEGATIVE  NEGATIVE Final   Comment: (NOTE) SARS-CoV-2 target nucleic acids are NOT DETECTED.  The SARS-CoV-2 RNA is generally detectable in upper respiratory specimens during the acute phase of infection. The lowest concentration of SARS-CoV-2 viral copies this assay can detect is 138 copies/mL. A negative result does not preclude  SARS-Cov-2 infection and should not be used as the sole basis for treatment or other patient management decisions. A negative result may occur with  improper specimen collection/handling, submission of specimen other than nasopharyngeal swab, presence of viral mutation(s) within the areas targeted by this assay, and inadequate number of viral copies(<138 copies/mL). A negative result must be combined with clinical observations, patient history, and epidemiological information. The expected result is Negative.  Fact Sheet for Patients:  BloggerCourse.com  Fact Sheet for Healthcare Providers:  SeriousBroker.it  This test is no                          t yet approved or cleared by the [place] FDA and  has been authorized for detection and/or diagnosis of SARS-CoV-2 by FDA under an Emergency Use Authorization (EUA). This EUA will remain  in effect (meaning this test can be used) for the duration of the COVID-19 declaration under Section 564(b)(1) of the Act, 21 U.S.C.section 360bbb-3(b)(1), unless the authorization is terminated  or revoked sooner.       Influenza A by PCR 12/12/2022 NEGATIVE  NEGATIVE Final  Influenza B by PCR 12/12/2022 NEGATIVE  NEGATIVE Final   Comment: (NOTE) The Xpert Xpress SARS-CoV-2/FLU/RSV plus assay is intended as an aid in the diagnosis of influenza from Nasopharyngeal swab specimens and should not be used as a sole basis for treatment. Nasal washings and aspirates are unacceptable for Xpert Xpress SARS-CoV-2/FLU/RSV testing.  Fact Sheet for Patients: EntrepreneurPulse.com.au  Fact Sheet for Healthcare Providers: IncredibleEmployment.be  This test is not yet approved or cleared by the [place] FDA and has been authorized for detection and/or diagnosis of SARS-CoV-2 by FDA under an Emergency Use Authorization (EUA). This EUA will remain in effect  (meaning this test can be used) for the duration of the COVID-19 declaration under Section 564(b)(1) of the Act, 21 U.S.C. section 360bbb-3(b)(1), unless the authorization is terminated or revoked.     Resp Syncytial Virus by PCR 12/12/2022 NEGATIVE  NEGATIVE Final   Comment: (NOTE) Fact Sheet for Patients: EntrepreneurPulse.com.au  Fact Sheet for Healthcare Providers: IncredibleEmployment.be  This test is not yet approved or cleared by the [place] FDA and has been authorized for detection and/or diagnosis of SARS-CoV-2 by FDA under an Emergency Use Authorization (EUA). This EUA will remain in effect (meaning this test can be used) for the duration of the COVID-19 declaration under Section 564(b)(1) of the Act, 21 U.S.C. section 360bbb-3(b)(1), unless the authorization is terminated or revoked.  Performed at [facility], [place] [address], [place], [place] [address]    WBC 12/12/2022 5.8  4.0 - 10.5 K/uL Final   RBC 12/12/2022 4.69  3.87 - 5.11 MIL/uL Final   Hemoglobin 12/12/2022 14.6  12.0 - 15.0 g/dL Final   HCT 12/12/2022 44.0  36.0 - 46.0 % Final   MCV 12/12/2022 93.8  80.0 - 100.0 fL Final   MCH 12/12/2022 31.1  26.0 - 34.0 pg Final   MCHC 12/12/2022 33.2  30.0 - 36.0 g/dL Final   RDW 12/12/2022 14.1  11.5 - 15.5 % Final   Platelets 12/12/2022 265  150 - 400 K/uL Final   nRBC 12/12/2022 0.0  0.0 - 0.2 % Final   Neutrophils Relative % 12/12/2022 52  % Final   Neutro Abs 12/12/2022 3.0  1.7 - 7.7 K/uL Final   Lymphocytes Relative 12/12/2022 29  % Final   Lymphs Abs 12/12/2022 1.6  0.7 - 4.0 K/uL Final   Monocytes Relative 12/12/2022 8  % Final   Monocytes Absolute 12/12/2022 0.4  0.1 - 1.0 K/uL Final   Eosinophils Relative 12/12/2022 11  % Final   Eosinophils Absolute 12/12/2022 0.6 (H)  0.0 - 0.5 K/uL Final   Basophils Relative 12/12/2022 0  % Final   Basophils Absolute 12/12/2022 0.0  0.0 - 0.1 K/uL Final   Immature  Granulocytes 12/12/2022 0  % Final   Abs Immature Granulocytes 12/12/2022 0.01  0.00 - 0.07 K/uL Final   Performed at [facility], [place] [address] [place], [place] [address]   Color, Urine 12/12/2022 YELLOW  YELLOW Final   APPearance 12/12/2022 CLEAR  CLEAR Final   Specific Gravity, Urine 12/12/2022 1.026  1.005 - 1.030 Final   pH 12/12/2022 6.0  5.0 - 8.0 Final   Glucose, UA 12/12/2022 NEGATIVE  NEGATIVE mg/dL Final   Hgb urine dipstick 12/12/2022 NEGATIVE  NEGATIVE Final   Bilirubin Urine 12/12/2022 NEGATIVE  NEGATIVE Final   Ketones, ur 12/12/2022 20 (A)  NEGATIVE mg/dL Final   Protein, ur 12/12/2022 100 (A)  NEGATIVE mg/dL Final   Nitrite 12/12/2022 NEGATIVE  NEGATIVE Final   Leukocytes,Ua 12/12/2022 NEGATIVE  NEGATIVE Final   RBC / HPF 12/12/2022 0-5  0 - 5 RBC/hpf Final   WBC, UA 12/12/2022 0-5  0 - 5 WBC/hpf Final   Bacteria, UA 12/12/2022 RARE (A)  NONE SEEN Final   Squamous Epithelial / LPF 12/12/2022 0-5  0 - 5 /HPF Final   Mucus 12/12/2022 PRESENT   Final   Performed at [facility], [place] [address] [place], [place] [address]   POC Amphetamine UR 12/12/2022 None Detected  NONE DETECTED (Cut Off Level 1000 ng/mL) Final   POC Secobarbital (BAR) 12/12/2022 None Detected  NONE DETECTED (Cut Off Level 300 ng/mL) Final   POC Buprenorphine (BUP) 12/12/2022 None Detected  NONE DETECTED (Cut Off Level 10 ng/mL) Final   POC Oxazepam (BZO) 12/12/2022 None Detected  NONE DETECTED (Cut Off Level 300 ng/mL) Final   POC Cocaine UR 12/12/2022 None Detected  NONE DETECTED (Cut Off Level 300 ng/mL) Final   POC Methamphetamine UR 12/12/2022 None Detected  NONE DETECTED (Cut Off Level 1000 ng/mL) Final   POC Morphine 12/12/2022 None Detected  NONE DETECTED (Cut Off Level 300 ng/mL) Final   POC Methadone UR 12/12/2022 None Detected  NONE DETECTED (Cut Off Level 300 ng/mL) Final   POC Oxycodone UR 12/12/2022 None Detected  NONE DETECTED (Cut Off Level 100 ng/mL) Final   POC Marijuana  UR 12/12/2022 None Detected  NONE DETECTED (Cut Off Level 50 ng/mL) Final   SARSCOV2ONAVIRUS 2 AG 12/12/2022 NEGATIVE  NEGATIVE Final   Comment: (NOTE) SARS-CoV-2 antigen NOT DETECTED.   Negative results are presumptive.  Negative results do not preclude SARS-CoV-2 infection and should not be used as the sole basis for treatment or other patient management decisions, including infection  control decisions, particularly in the presence of clinical signs and  symptoms consistent with COVID-19, or in those who have been in contact with the virus.  Negative results must be combined with clinical observations, patient history, and epidemiological information. The expected result is Negative.  Fact Sheet for Patients: HandmadeRecipes.com.cy  Fact Sheet for Healthcare Providers: FuneralLife.at  This test is not yet approved or cleared by the [place] FDA and  has been authorized for detection and/or diagnosis of SARS-CoV-2 by FDA under an Emergency Use Authorization (EUA).  This EUA will remain in effect (meaning this test can be used) for the duration of  the COV                          ID-19 declaration under Section 564(b)(1) of the Act, 21 U.S.C. section 360bbb-3(b)(1), unless the authorization is terminated or revoked sooner.    Admission on 11/11/2022, Discharged on 11/11/2022  Component Date Value Ref Range Status   Color, Urine 11/11/2022 AMBER (A)  YELLOW Final   BIOCHEMICALS MAY BE AFFECTED BY COLOR   APPearance 11/11/2022 HAZY (A)  CLEAR Final   Specific Gravity, Urine 11/11/2022 >=1.030  1.005 - 1.030 Final   pH 11/11/2022 6.0  5.0 - 8.0 Final   Glucose, UA 11/11/2022 NEGATIVE  NEGATIVE mg/dL Final   Hgb urine dipstick 11/11/2022 TRACE (A)  NEGATIVE Final   Bilirubin Urine 11/11/2022 MODERATE (A)  NEGATIVE Final   Ketones, ur 11/11/2022 40 (A)  NEGATIVE mg/dL Final   Protein, ur 11/11/2022 100 (A)  NEGATIVE mg/dL Final    Nitrite 11/11/2022 NEGATIVE  NEGATIVE Final   Leukocytes,Ua 11/11/2022 SMALL (A)  NEGATIVE Final   Performed at  [facility], [place] [place], [place] [address]   RBC / HPF 11/11/2022 6-10  0 - 5 RBC/hpf Final   WBC, UA 11/11/2022 6-10  0 - 5 WBC/hpf Final   Bacteria, UA 11/11/2022 FEW (A)  NONE SEEN Final   Squamous Epithelial / LPF 11/11/2022 6-10  0 - 5 Final   Mucus 11/11/2022 PRESENT   Final   Performed at [facility], [place] [place], [place] [address]   Sodium 11/11/2022 140  135 - 145 mmol/L Final   Potassium 11/11/2022 3.6  3.5 - 5.1 mmol/L Final   Chloride 11/11/2022 102  98 - 111 mmol/L Final   CO2 11/11/2022 25  22 - 32 mmol/L Final   Glucose, Bld 11/11/2022 84  70 - 99 mg/dL Final   Glucose reference range applies only to samples taken after fasting for at least 8 hours.   BUN 11/11/2022 15  6 - 20 mg/dL Final   Creatinine, Ser 11/11/2022 0.83  0.44 - 1.00 mg/dL Final   Calcium 11/11/2022 9.6  8.9 - 10.3 mg/dL Final   Total Protein 11/11/2022 7.9  6.5 - 8.1 g/dL Final   Albumin 11/11/2022 4.4  3.5 - 5.0 g/dL Final   AST 11/11/2022 27  15 - 41 U/L Final   ALT 11/11/2022 28  0 - 44 U/L Final   Alkaline Phosphatase 11/11/2022 43  38 - 126 U/L Final   Total Bilirubin 11/11/2022 0.7  0.3 - 1.2 mg/dL Final   GFR, Estimated 11/11/2022 >60  >60 mL/min Final   Comment: (NOTE) Calculated using the CKD-EPI Creatinine Equation (2021)    Anion gap 11/11/2022 13  5 - 15 Final   Performed at [facility], [place] [place], [place] [address]   HCV Ab 11/11/2022 NON REACTIVE  NON REACTIVE Final   Comment: (NOTE) Nonreactive HCV antibody screen is consistent with no HCV infections,  unless recent infection is suspected or other evidence exists to indicate HCV infection.  Performed at [facility], [place] [address] [place], [place] [address]    Hepatitis B Surface Ag 11/11/2022 NON REACTIVE  NON REACTIVE Final    Performed at [facility], [place] [address], [place], [place] [address]   RPR Ser Ql 11/11/2022 NON REACTIVE  NON REACTIVE Final   Performed at [facility], [place] [address], [place], [place] [address]   Preg Test, Ur 11/11/2022 NEGATIVE  NEGATIVE Final   Comment:        THE SENSITIVITY OF THIS METHODOLOGY IS >20 mIU/mL. Performed at [facility], [address], [place], [place] [address]    HIV Screen 4th Generation wRfx 11/11/2022 Non Reactive  Non Reactive Final   Performed at [facility], [place] [address] [place], [place] [address]   HIV-1 P24 Antigen - HIV24 11/11/2022 NON REACTIVE  NON REACTIVE Final   Comment: (NOTE) Detection of p24 may be inhibited by biotin in the sample, causing false negative results in acute infection.    HIV 1/2 Antibodies 11/11/2022 NON REACTIVE  NON REACTIVE Final   Interpretation (HIV Ag Ab) 11/11/2022 A non reactive test result means that HIV 1 or HIV 2 antibodies and HIV 1 p24 antigen were not detected in the specimen.   Final   Performed at [facility], [address] [place], [place] [address]  Admission on 11/07/2022, Discharged on 11/08/2022  Component  Date Value Ref Range Status   SARS Coronavirus 2 by RT PCR 11/07/2022 NEGATIVE  NEGATIVE Final   Comment: (NOTE) SARS-CoV-2 target nucleic acids are NOT DETECTED.  The SARS-CoV-2 RNA is generally detectable in upper respiratory specimens during the acute phase of infection. The lowest concentration of SARS-CoV-2 viral copies this assay can detect is 138 copies/mL. A negative result does not preclude SARS-Cov-2 infection and should not be used as the sole basis for treatment or other patient management decisions. A negative result may occur with  improper specimen collection/handling, submission of specimen other than nasopharyngeal swab, presence of viral mutation(s) within the areas targeted by this assay, and inadequate number of viral copies(<138  copies/mL). A negative result must be combined with clinical observations, patient history, and epidemiological information. The expected result is Negative.  Fact Sheet for Patients:  EntrepreneurPulse.com.au  Fact Sheet for Healthcare Providers:  IncredibleEmployment.be  This test is no                          t yet approved or cleared by the [place] FDA and  has been authorized for detection and/or diagnosis of SARS-CoV-2 by FDA under an Emergency Use Authorization (EUA). This EUA will remain  in effect (meaning this test can be used) for the duration of the COVID-19 declaration under Section 564(b)(1) of the Act, 21 U.S.C.section 360bbb-3(b)(1), unless the authorization is terminated  or revoked sooner.       Influenza A by PCR 11/07/2022 NEGATIVE  NEGATIVE Final   Influenza B by PCR 11/07/2022 NEGATIVE  NEGATIVE Final   Comment: (NOTE) The Xpert Xpress SARS-CoV-2/FLU/RSV plus assay is intended as an aid in the diagnosis of influenza from Nasopharyngeal swab specimens and should not be used as a sole basis for treatment. Nasal washings and aspirates are unacceptable for Xpert Xpress SARS-CoV-2/FLU/RSV testing.  Fact Sheet for Patients: EntrepreneurPulse.com.au  Fact Sheet for Healthcare Providers: IncredibleEmployment.be  This test is not yet approved or cleared by the [place] FDA and has been authorized for detection and/or diagnosis of SARS-CoV-2 by FDA under an Emergency Use Authorization (EUA). This EUA will remain in effect (meaning this test can be used) for the duration of the COVID-19 declaration under Section 564(b)(1) of the Act, 21 U.S.C. section 360bbb-3(b)(1), unless the authorization is terminated or revoked.  Performed at [facility], [place] [address] [place], [place] [address]    WBC 11/07/2022 6.6  4.0 - 10.5 K/uL Final   RBC 11/07/2022 4.68  3.87 - 5.11 MIL/uL  Final   Hemoglobin 11/07/2022 14.6  12.0 - 15.0 g/dL Final   HCT 11/07/2022 44.8  36.0 - 46.0 % Final   MCV 11/07/2022 95.7  80.0 - 100.0 fL Final   MCH 11/07/2022 31.2  26.0 - 34.0 pg Final   MCHC 11/07/2022 32.6  30.0 - 36.0 g/dL Final   RDW 11/07/2022 13.9  11.5 - 15.5 % Final   Platelets 11/07/2022 282  150 - 400 K/uL Final   nRBC 11/07/2022 0.0  0.0 - 0.2 % Final   Neutrophils Relative % 11/07/2022 46  % Final   Neutro Abs 11/07/2022 3.0  1.7 - 7.7 K/uL Final   Lymphocytes Relative 11/07/2022 39  % Final   Lymphs Abs 11/07/2022 2.6  0.7 - 4.0 K/uL Final   Monocytes Relative 11/07/2022 7  % Final   Monocytes Absolute 11/07/2022 0.5  0.1 - 1.0 K/uL Final   Eosinophils Relative 11/07/2022 7  %  Final   Eosinophils Absolute 11/07/2022 0.5  0.0 - 0.5 K/uL Final   Basophils Relative 11/07/2022 1  % Final   Basophils Absolute 11/07/2022 0.0  0.0 - 0.1 K/uL Final   Immature Granulocytes 11/07/2022 0  % Final   Abs Immature Granulocytes 11/07/2022 0.02  0.00 - 0.07 K/uL Final   Performed at [facility], [place] [address] [place], [place] [address]   Sodium 11/07/2022 141  135 - 145 mmol/L Final   Potassium 11/07/2022 4.1  3.5 - 5.1 mmol/L Final   Chloride 11/07/2022 104  98 - 111 mmol/L Final   CO2 11/07/2022 23  22 - 32 mmol/L Final   Glucose, Bld 11/07/2022 70  70 - 99 mg/dL Final   Glucose reference range applies only to samples taken after fasting for at least 8 hours.   BUN 11/07/2022 15  6 - 20 mg/dL Final   Creatinine, Ser 11/07/2022 0.80  0.44 - 1.00 mg/dL Final   Calcium 11/07/2022 10.3  8.9 - 10.3 mg/dL Final   Total Protein 11/07/2022 7.9  6.5 - 8.1 g/dL Final   Albumin 11/07/2022 4.7  3.5 - 5.0 g/dL Final   AST 11/07/2022 28  15 - 41 U/L Final   ALT 11/07/2022 22  0 - 44 U/L Final   Alkaline Phosphatase 11/07/2022 39  38 - 126 U/L Final   Total Bilirubin 11/07/2022 0.8  0.3 - 1.2 mg/dL Final   GFR, Estimated 11/07/2022 >60  >60 mL/min Final   Comment:  (NOTE) Calculated using the CKD-EPI Creatinine Equation (2021)    Anion gap 11/07/2022 14  5 - 15 Final   Performed at [facility] [address] [place], [place] [address]   Hgb A1c MFr Bld 11/07/2022 5.8 (H)  4.8 - 5.6 % Final   Comment: (NOTE)         Prediabetes: 5.7 - 6.4         Diabetes: >6.4         Glycemic control for adults with diabetes: <7.0    Mean Plasma Glucose 11/07/2022 120  mg/dL Final   Comment: (NOTE) Performed At: [facility] [place], [place] [id number] Rush Farmer MD [id number]    Alcohol, Ethyl (B) 11/07/2022 <10  <10 mg/dL Final   Comment: (NOTE) Lowest detectable limit for serum alcohol is 10 mg/dL.  For medical purposes only. Performed at [facility], [place] [address], [place], [place] [address]    Prolactin 11/07/2022 12.7  4.8 - 23.3 ng/mL Final   Comment: (NOTE) **Effective December 01, 2022 Prolactin reference**  interval will be changing to:             Age           Female (ng/mL)  Female (ng/mL)          0 - 30 days       4.3 - 32.8     4.5 - 24.4          1 -  6 mos        5.4 - 51.4     5.4 - 51.4         88m -  1 yrs        5.3 - 28.9     4.8 - 33.4          2 - 12 yrs        1.8 - 44.2     4.8 -  33.4         13 - 30 yrs        3.6 - 31.5     4.8 - 33.4         31 - 50 yrs        3.9 - 22.7     4.8 - 33.4         51 - 80 yrs        3.6 - 25.2     3.6 - 25.2             >80 yrs        3.6 - 32.0     3.6 - 32.0 Performed At: [facility] [place], [place] [id number] Rush Farmer MD [id number]    POC Amphetamine UR 11/07/2022 None Detected  NONE DETECTED (Cut Off Level 1000 ng/mL) Preliminary   POC Secobarbital (BAR) 11/07/2022 None Detected  NONE DETECTED (Cut Off Level 300 ng/mL) Preliminary   POC Buprenorphine (BUP) 11/07/2022 None Detected  NONE DETECTED (Cut Off Level 10 ng/mL) Preliminary   POC Oxazepam (BZO) 11/07/2022 None Detected  NONE DETECTED (Cut Off Level  300 ng/mL) Preliminary   POC Cocaine UR 11/07/2022 None Detected  NONE DETECTED (Cut Off Level 300 ng/mL) Preliminary   POC Methamphetamine UR 11/07/2022 None Detected  NONE DETECTED (Cut Off Level 1000 ng/mL) Preliminary   POC Morphine 11/07/2022 None Detected  NONE DETECTED (Cut Off Level 300 ng/mL) Preliminary   POC Methadone UR 11/07/2022 None Detected  NONE DETECTED (Cut Off Level 300 ng/mL) Preliminary   POC Oxycodone UR 11/07/2022 None Detected  NONE DETECTED (Cut Off Level 100 ng/mL) Preliminary   POC Marijuana UR 11/07/2022 None Detected  NONE DETECTED (Cut Off Level 50 ng/mL) Preliminary   Color, Urine 11/07/2022 YELLOW  YELLOW Final   APPearance 11/07/2022 HAZY (A)  CLEAR Final   Specific Gravity, Urine 11/07/2022 1.025  1.005 - 1.030 Final   pH 11/07/2022 5.0  5.0 - 8.0 Final   Glucose, UA 11/07/2022 NEGATIVE  NEGATIVE mg/dL Final   Hgb urine dipstick 11/07/2022 NEGATIVE  NEGATIVE Final   Bilirubin Urine 11/07/2022 NEGATIVE  NEGATIVE Final   Ketones, ur 11/07/2022 80 (A)  NEGATIVE mg/dL Final   Protein, ur 11/07/2022 NEGATIVE  NEGATIVE mg/dL Final   Nitrite 11/07/2022 NEGATIVE  NEGATIVE Final   Leukocytes,Ua 11/07/2022 LARGE (A)  NEGATIVE Final   RBC / HPF 11/07/2022 0-5  0 - 5 RBC/hpf Final   WBC, UA 11/07/2022 11-20  0 - 5 WBC/hpf Final   Bacteria, UA 11/07/2022 NONE SEEN  NONE SEEN Final   Squamous Epithelial / LPF 11/07/2022 0-5  0 - 5 Final   Mucus 11/07/2022 PRESENT   Final   Performed at [facility], [place] [address] [place], [place] [address]   SARSCOV2ONAVIRUS 2 AG 11/07/2022 NEGATIVE  NEGATIVE Final   Comment: (NOTE) SARS-CoV-2 antigen NOT DETECTED.   Negative results are presumptive.  Negative results do not preclude SARS-CoV-2 infection and should not be used as the sole basis for treatment or other patient management decisions, including infection  control decisions, particularly in the presence of clinical signs and  symptoms consistent with COVID-19,  or in those who have been in contact with the virus.  Negative results must be combined with clinical observations, patient history, and epidemiological information. The expected result is Negative.  Fact Sheet for Patients: HandmadeRecipes.com.cy  Fact Sheet for Healthcare Providers: FuneralLife.at  This test is not yet  approved or cleared by the [place] and  has been authorized for detection and/or diagnosis of SARS-CoV-2 by FDA under an Emergency Use Authorization (EUA).  This EUA will remain in effect (meaning this test can be used) for the duration of  the COV                          ID-19 declaration under Section 564(b)(1) of the Act, 21 U.S.C. section 360bbb-3(b)(1), unless the authorization is terminated or revoked sooner.     Preg Test, Ur 11/07/2022 NEGATIVE  NEGATIVE Final   Comment:        THE SENSITIVITY OF THIS METHODOLOGY IS >24 mIU/mL    Cholesterol 11/07/2022 240 (H)  0 - 200 mg/dL Final   Triglycerides 11/07/2022 36  <150 mg/dL Final   HDL 11/07/2022 105  >40 mg/dL Final   Total CHOL/HDL Ratio 11/07/2022 2.3  RATIO Final   VLDL 11/07/2022 7  0 - 40 mg/dL Final   LDL Cholesterol 11/07/2022 128 (H)  0 - 99 mg/dL Final   Comment:        Total Cholesterol/HDL:CHD Risk Coronary Heart Disease Risk Table                     Men   Women  1/2 Average Risk   3.4   3.3  Average Risk       5.0   4.4  2 X Average Risk   9.6   7.1  3 X Average Risk  23.4   11.0        Use the calculated Patient Ratio above and the CHD Risk Table to determine the patient's CHD Risk.        ATP III CLASSIFICATION (LDL):  <100     mg/dL   Optimal  100-129  mg/dL   Near or Above                    Optimal  130-159  mg/dL   Borderline  160-189  mg/dL   High  >190     mg/dL   Very High Performed at [facility] [address], [place], [place] [address]    TSH 11/07/2022 4.152  0.350 - 4.500 uIU/mL Final   Comment:  Performed by a 3rd Generation assay with a functional sensitivity of <=0.01 uIU/mL. Performed at [facility], [place] [address] [place], [place] [address]   Admission on 10/28/2022, Discharged on 10/29/2022  Component Date Value Ref Range Status   WBC 10/28/2022 5.3  4.0 - 10.5 K/uL Final   RBC 10/28/2022 4.38  3.87 - 5.11 MIL/uL Final   Hemoglobin 10/28/2022 13.7  12.0 - 15.0 g/dL Final   HCT 10/28/2022 41.4  36.0 - 46.0 % Final   MCV 10/28/2022 94.5  80.0 - 100.0 fL Final   MCH 10/28/2022 31.3  26.0 - 34.0 pg Final   MCHC 10/28/2022 33.1  30.0 - 36.0 g/dL Final   RDW 10/28/2022 13.5  11.5 - 15.5 % Final   Platelets 10/28/2022 301  150 - 400 K/uL Final   nRBC 10/28/2022 0.0  0.0 - 0.2 % Final   Neutrophils Relative % 10/28/2022 52  % Final   Neutro Abs 10/28/2022 2.8  1.7 - 7.7 K/uL Final   Lymphocytes Relative 10/28/2022 33  % Final   Lymphs Abs 10/28/2022 1.8  0.7 - 4.0 K/uL Final   Monocytes Relative 10/28/2022  9  % Final   Monocytes Absolute 10/28/2022 0.5  0.1 - 1.0 K/uL Final   Eosinophils Relative 10/28/2022 5  % Final   Eosinophils Absolute 10/28/2022 0.3  0.0 - 0.5 K/uL Final   Basophils Relative 10/28/2022 1  % Final   Basophils Absolute 10/28/2022 0.0  0.0 - 0.1 K/uL Final   Immature Granulocytes 10/28/2022 0  % Final   Abs Immature Granulocytes 10/28/2022 0.01  0.00 - 0.07 K/uL Final   Performed at [facility], [place] [address] [place], [place] [address]   Sodium 10/28/2022 140  135 - 145 mmol/L Final   Potassium 10/28/2022 3.2 (L)  3.5 - 5.1 mmol/L Final   Chloride 10/28/2022 103  98 - 111 mmol/L Final   CO2 10/28/2022 25  22 - 32 mmol/L Final   Glucose, Bld 10/28/2022 99  70 - 99 mg/dL Final   Glucose reference range applies only to samples taken after fasting for at least 8 hours.   BUN 10/28/2022 17  6 - 20 mg/dL Final   Creatinine, Ser 10/28/2022 1.02 (H)  0.44 - 1.00 mg/dL Final   Calcium 10/28/2022 10.0  8.9 - 10.3 mg/dL Final   Total Protein  10/28/2022 7.1  6.5 - 8.1 g/dL Final   Albumin 10/28/2022 4.1  3.5 - 5.0 g/dL Final   AST 10/28/2022 25  15 - 41 U/L Final   ALT 10/28/2022 25  0 - 44 U/L Final   Alkaline Phosphatase 10/28/2022 38  38 - 126 U/L Final   Total Bilirubin 10/28/2022 0.6  0.3 - 1.2 mg/dL Final   GFR, Estimated 10/28/2022 >60  >60 mL/min Final   Comment: (NOTE) Calculated using the CKD-EPI Creatinine Equation (2021)    Anion gap 10/28/2022 12  5 - 15 Final   Performed at [facility] [address], [place], [place] [address]   Color, Urine 10/28/2022 AMBER (A)  YELLOW Final   BIOCHEMICALS MAY BE AFFECTED BY COLOR   APPearance 10/28/2022 CLOUDY (A)  CLEAR Final   Specific Gravity, Urine 10/28/2022 1.031 (H)  1.005 - 1.030 Final   pH 10/28/2022 5.0  5.0 - 8.0 Final   Glucose, UA 10/28/2022 NEGATIVE  NEGATIVE mg/dL Final   Hgb urine dipstick 10/28/2022 NEGATIVE  NEGATIVE Final   Bilirubin Urine 10/28/2022 NEGATIVE  NEGATIVE Final   Ketones, ur 10/28/2022 NEGATIVE  NEGATIVE mg/dL Final   Protein, ur 10/28/2022 100 (A)  NEGATIVE mg/dL Final   Nitrite 10/28/2022 NEGATIVE  NEGATIVE Final   Leukocytes,Ua 10/28/2022 LARGE (A)  NEGATIVE Final   RBC / HPF 10/28/2022 6-10  0 - 5 RBC/hpf Final   WBC, UA 10/28/2022 21-50  0 - 5 WBC/hpf Final   Bacteria, UA 10/28/2022 MANY (A)  NONE SEEN Final   Squamous Epithelial / LPF 10/28/2022 21-50  0 - 5 Final   Mucus 10/28/2022 PRESENT   Final   Non Squamous Epithelial 10/28/2022 0-5 (A)  NONE SEEN Final   Performed at [facility], [place] [address], [place], [place] [address]   Opiates 10/28/2022 NONE DETECTED  NONE DETECTED Final   Cocaine 10/28/2022 NONE DETECTED  NONE DETECTED Final   Benzodiazepines 10/28/2022 NONE DETECTED  NONE DETECTED Final   Amphetamines 10/28/2022 NONE DETECTED  NONE DETECTED Final   Tetrahydrocannabinol 10/28/2022 NONE DETECTED  NONE DETECTED Final   Barbiturates 10/28/2022 NONE DETECTED  NONE DETECTED Final   Comment: (NOTE) DRUG  SCREEN FOR MEDICAL PURPOSES ONLY.  IF CONFIRMATION IS NEEDED FOR  ANY PURPOSE, NOTIFY LAB WITHIN 5 DAYS.  LOWEST DETECTABLE LIMITS FOR URINE DRUG SCREEN Drug Class                     Cutoff (ng/mL) Amphetamine and metabolites    1000 Barbiturate and metabolites    200 Benzodiazepine                 200 Opiates and metabolites        300 Cocaine and metabolites        300 THC                            50 Performed at [facility], [address], [place], [place] [address]    Alcohol, Ethyl (B) 10/28/2022 <10  <10 mg/dL Final   Comment: (NOTE) Lowest detectable limit for serum alcohol is 10 mg/dL.  For medical purposes only. Performed at [facility], [address] [place], [place] [address]    Sed Rate 10/29/2022 0  0 - 22 mm/hr Final   Performed at [facility], [address] [place], [place] [address]   TSH 10/29/2022 1.608  0.350 - 4.500 uIU/mL Final   Comment: Performed by a 3rd Generation assay with a functional sensitivity of <=0.01 uIU/mL. Performed at [facility], [address], [place], [place] [address]    RPR Ser Ql 10/29/2022 NON REACTIVE  NON REACTIVE Final   Performed at [facility], [address], [place], [place] [address]   Allergies: Banana, Dairycare [lactase-lactobacillus], Eggs or egg-derived products, Pineapple, Shellfish allergy, and Watermelon [citrullus vulgaris] PTA Medications: (Not in a hospital admission)   Medical Decision Making  Silvanna Prickett is a 56 y.o. female, with PMH of psychosis, inpatient psych admission, no suicide attempt who presented voluntary to [facility] (12/12/2022) with husband and daughter for psychosis x6-55mo.   Schizophreniform v BiPD w psychosis  R/o thyroid and other medical causes UDS negative.  No UTI.  Last month, HIV, hepatic panel, and RPR negative.  Last month's head CT showed no acute concerns. Last month, sed rate and PRL were wnl. Patient with  worsening psychosis for at least 2 months.  Her husband collateral, patient may have had a a 3-month - 1 year prodromal stage. Patient was at [facility], she was started on Invega to good effect.  However she was transferred to [facility], per husband she was not discharged on any medications.  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  Started invega 3 mg qHS  Eczema, flare  Multiple food allergies Long hx, following dermatologist outpatient. Absolute eos count 0.6. Unclear of reason for flare up. Denied new contacts, detergents, fragrances, soaps, pets, ect.  Will treat symptomatically. Recommended immunologist referral once outpatient Started claritin qHS Started Vaseline PRN Started calamine lotions PRN Started epipen PRN  Patient was admitted to [facility] observations overnight, with plans to start patient on Invega. Re-evaluation the need for inpatient psych on 12/30 AM.    Total Time spent with patient: 1 hour Recommendations  Based on my evaluation the patient does not appear to have an emergency medical condition.    Labs Reviewed  CBC WITH DIFFERENTIAL/PLATELET - Abnormal; Notable for the following components:      Result Value   Eosinophils Absolute 0.6 (*)  All other components within normal limits  URINALYSIS, ROUTINE W REFLEX MICROSCOPIC - Abnormal; Notable for the following components:   Ketones, ur 20 (*)    Protein, ur 100 (*)    Bacteria, UA RARE (*)    All other components within normal limits  POCT URINE DRUG SCREEN - MANUAL ENTRY (I-SCREEN) - Normal  RESP PANEL BY RT-PCR (RSV, FLU A&B, COVID)  [id number]  COMPREHENSIVE METABOLIC PANEL  TSH  T3, FREE  T4, FREE  HIV ANTIBODY (ROUTINE TESTING W REFLEX)  POC URINE PREG, ED  POC SARS CORONAVIRUS 2 AG    Orders Placed This Encounter  Procedures   Resp panel by RT-PCR (RSV, Flu A&B, Covid) Anterior Nasal Swab   CBC with  Differential/Platelet   Comprehensive metabolic panel   TSH   Urinalysis, Routine w reflex microscopic Urine, Clean Catch   T3, free   T4, free   HIV Antibody (routine testing w rflx)   Diet regular Room service appropriate? Yes; Fluid consistency: Thin   Voluntary Treatment   Vital signs   Activity as tolerated   Full code   POCT Urine Drug Screen - (I-Screen)   POC urine preg, ED   POC SARS Coronavirus 2 Ag   EKG 12-Lead   EKG 12-Lead   Place in continuous assessment [facility]    Meds ordered this encounter  Medications   acetaminophen (TYLENOL) tablet 650 mg   alum & mag hydroxide-simeth (MAALOX/MYLANTA) 200-200-20 MG/5ML suspension 30 mL   magnesium hydroxide (MILK OF MAGNESIA) suspension 30 mL   hydrOXYzine (ATARAX) tablet 25 mg   white petrolatum (VASELINE) gel   calamine lotion 1 Application   albuterol (VENTOLIN HFA) 108 (90 Base) MCG/ACT inhaler 1-2 puff   DISCONTD: cyanocobalamin (VITAMIN B12) tablet 1,000 mcg   EPINEPHrine (EPI-PEN) injection 0.3 mg   multivitamin with minerals tablet 1 tablet   paliperidone (INVEGA) 24 hr tablet 3 mg   cyanocobalamin (VITAMIN B12) tablet 1,000 mcg   loratadine (CLARITIN) tablet 10 mg     Signed: Princess Bruins, DO Psychiatry Resident, PGY-2 [facility]  12/12/2022, 6:51 PM

## 2022-12-12 NOTE — Progress Notes (Signed)
Received Ilithyia in the assessment room, she was cooperative with the admission process. She endorsed feeling anxious and trying not to over think. She endorsed racing thoughts. She stated past week of passive SI without a plan. She was relocated to the unit oriented and given nourishments.

## 2022-12-12 NOTE — BH Assessment (Signed)
Comprehensive Clinical Assessment (CCA) Screening, Triage and Referral Note  12/12/2022 Brandy Hogan [id number]  Disposition: Per Brandy Bruins DO, patient is recommended for overnight observation.   Chief Complaint:  Chief Complaint  Patient presents with   Insomnia   Pt reports she has not slept well in months. Yesterday pt reports sleeping around 2-3 hours and now she is having racing and negative thoughts and feeling anxious. Pt reports she was here on 11/24 and she slept good after taking a little brown pill with the number 25 on it. Pt reports she was transferred to another hospital but she didn't sleep good there either. Pt reports she only took the pill one time and she hopes to get a prescription for the medications to help her sleep better.  Pt denies SI, HI, AVH and substance use. Pt denies feeling paranoid however reports she notices cars with tinted windows on the side of the road but  "that don't mean they are following me". Pt is repeating "focus" and "stop" due to negative thoughts and feeling down about herself.    Pt has a therapy appointment on 12/17/21 however she does not have medication management. Pt has a history of inpatient treatment last month with diagnosis of acute psychosis. Patient is alert, engaged and cooperative during assessment. Patient eye contact is avoidant, her speech is normal, affect is flat. Patient responding to what she reports are her negative thoughts by repeating "stop" and "focus".   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 Family/Friend  What Is the Reason for Your Visit/Call Today? Difficulty sleeping  How Long Has This Been Causing You Problems? 1 wk - 1 month  What Do You Feel Would Help You the Most Today? Treatment for Depression or other mood problem   Have You Recently Had Any Thoughts About Hurting Yourself? No  Are You Planning to Commit Suicide/Harm  Yourself At This time? No   Have you Recently Had Thoughts About Hurting Someone Brandy Hogan? No  Are You Planning to Harm Someone at This Time? No  Explanation: NA   Have You Used Any Alcohol or Drugs in the Past 24 Hours? No  How Long Ago Did You Use Drugs or Alcohol? No data recorded What Did You Use and How Much? NA   Do You Currently Have a Therapist/Psychiatrist? No  Name of Therapist/Psychiatrist: NA   Have You Been Recently Discharged From Any Office Practice or Programs? No  Explanation of Discharge From Practice/Program: NA    CCA Screening Triage Referral Assessment Type of Contact: Face-to-Face  Telemedicine Service Delivery:   Is this Initial or Reassessment?   Date Telepsych consult ordered in [facility]:    Time Telepsych consult ordered in [facility]:    Location [facility] Assessment: [facility]  Provider Location: [facility]    Collateral Involvement: NA   Does Patient Have a Automotive engineer Guardian? No data recorded Name and Contact of Legal Guardian: No data recorded If Minor and Not Living with Parent(s), Who has Custody? NA  Is CPS involved or ever been involved? Never  Is APS involved or ever been involved? Never   Patient Determined To Be At Risk for Harm To Self or Others Based on Review of Patient Reported Information or Presenting Complaint? No  Method: No Plan  Availability of Means: No access or NA  Intent: Vague intent or NA  Notification Required: No need or identified person  Additional Information for Danger to Others Potential: Active psychosis  Additional Comments for Danger to Others Potential: NA  Are There Guns or Other Weapons in [place]? No  Types of Guns/Weapons: NA  Are These Weapons Safely Secured?                            -- (NA)  Who Could Verify You Are Able To Have These Secured: HUSBAND  Do You Have any Outstanding Charges, Pending Court Dates, Parole/Probation? NO  Contacted To  Inform of Risk of Harm To Self or Others: Family/Significant Other:   Does Patient Present under Involuntary Commitment? No    [place] of Residence: [place]   Patient Currently Receiving the Following Services: Individual Therapy   Determination of Need: Routine (7 days)   Options For Referral: Medication Management; Outpatient Therapy   Discharge Disposition:     Brandy Hogan, [facility]

## 2022-12-13 LAB — T3, FREE: T3, Free: 3.3 pg/mL (ref 2.0–4.4)

## 2022-12-13 MED ORDER — DIPHENHYDRAMINE HCL 50 MG PO CAPS: 50.0000 mg | ORAL_CAPSULE | Freq: Once | ORAL | Status: DC | PRN

## 2022-12-13 MED ORDER — HALOPERIDOL 5 MG PO TABS: 5.0000 mg | ORAL_TABLET | Freq: Two times a day (BID) | ORAL | Status: DC | PRN

## 2022-12-13 MED ORDER — DIPHENHYDRAMINE HCL 50 MG PO CAPS
50.0000 mg | ORAL_CAPSULE | Freq: Once | ORAL | Status: AC
  Administered 2022-12-13: 50 mg via ORAL

## 2022-12-13 MED ORDER — BENZTROPINE MESYLATE 1 MG PO TABS: 1.0000 mg | ORAL_TABLET | Freq: Two times a day (BID) | ORAL | Status: DC | PRN

## 2022-12-13 MED ORDER — OLANZAPINE 10 MG PO TBDP
10.0000 mg | ORAL_TABLET | Freq: Once | ORAL | Status: AC | PRN
  Administered 2022-12-13: 10 mg via ORAL
  Filled 2022-12-13: qty 1

## 2022-12-13 MED ORDER — DIPHENHYDRAMINE HCL 50 MG PO CAPS
ORAL_CAPSULE | ORAL | Status: AC
  Filled 2022-12-13: qty 1

## 2022-12-13 MED ORDER — HALOPERIDOL LACTATE 5 MG/ML IJ SOLN: 5.0000 mg | Freq: Two times a day (BID) | INTRAMUSCULAR | Status: DC | PRN

## 2022-12-13 NOTE — ED Notes (Signed)
PRN meds given per order for swelling tongue. Will monitor. No resp distress. Speech is clear. Resp even and unlabored

## 2022-12-13 NOTE — ED Notes (Signed)
Pt is anxious and demanding to leave facility today. Instructed her that the provider is aware and will eturn to see her soon. Provider notified

## 2022-12-13 NOTE — ED Provider Notes (Cosign Needed)
Behavioral Health Progress Note  Date and Time: 12/13/2022 9:53 AM Name: Brandy Hogan MRN:  [id number]  Subjective: Patient seen and evaluated face-to-face by this provider, chart reviewed and case discussed with Dr. Dwyane Dee. On evaluation, patient is alert and oriented to person, place and time. However she is disoriented to situation. Her thought process is  circumstantial with delusional and paranoid thought content. Her speech is coherent at a decreased tone. Her mood is dysphoric and affect is congruent. She is calm and cooperative. She states that last night she tried to get some sleep and was given some medications that made her talk a lot to herself. 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 She states that she was given another medication that helped her sleep last night. Per chart review, nursing noted that the patient was a awake between the hours of 2 and 3 AM this morning talking to herself. She denies auditory or visual hallucinations. However, she reports talking in her head. There was no evidence on evaluation that the patient was responding to internal or external stimuli. However nursing has noted that they have observed the patient talking to herself on the unit. Patient denies feeling paranoid today but states that around September she felt like someone was trying to kill her. She expresses racing thoughts in her head that is making her say things that is not in her character. She denies suicidal ideations. She denies homicidal ideations. She reports fair sleep. She reports physical symptoms of difficulty swallowing this morning and using more effort to swallow. On exam she is able to perform range of motion of the tongue and swallow without any distress noted. No tongue swelling noted on exam. When asked more specifically if she is experiencing muscle spasms to the tongue or throat she is unable to elaborate. She is noted to have minimal atopic dermatitis  to her face. She states that she has eczema on her face for the past 2 months. On evaluation I did not suspect an allergic reaction. I suspect possible dystonic reaction to antipsychotic medication. Will order Cogentin 1 mg p.o. twice daily PRN for EPS. Patient was administered Benadryl 50 mg po once  per Dr. Dwyane Dee. Per chart review, the patient received Invega 3 mg p.o. last night at 2154 and was administered Zyprexa 10 mg p.o. at 0338 AM this morning. Will continue to monitor patient medications and signs for EPS.  HPI: Per chart review,  Brandy Hogan is a 56 y.o. female, with PMH of psychosis, inpatient psych admission, no suicide attempt who presented voluntary to [facility] (12/12/2022) with husband and daughter for psychosis x6-66mo.  Patient was initially seen in the assessment room with her hands over her ears, head bent over saying "stop". Patient was pleasant and cooperative on interview, however was guarded. Patient presented to [facility] because she "hasn't been sleeping well, having negative thoughts and needs a little brown pill with the number 25 on it". Patient adamantly denied that she is paranoid, exhibiting hallucinations, or psychotic. Patient stated that she has been feeling overwhelmed, for unclear reasons. She reported sleeping about 2-to 3 hours a night, poor months now. Stated that the [place] she received from [facility] was the only thing that helped her sleep. Patient was initially vague and perseverated on having negative thoughts, which later patient later described as fear of someone is after her to kill her, she mentioned that earlier this week she saw a commercial  about organ donation, believed that it was a sinus and was then a steal her organs. Reported she often has to say "stop" for "focus" to calm her mind of the racing thoughts. She denied auditory or visual hallucinations, stated that what she hears is her own voice. She denied these thoughts/voices are  encouraging her to hurt herself or other people. She denies these thoughts are telling her to do anything in particular. Reported that she feels safe at home despite intermittent fears that people are after her. She denied hearing that people are poisoning her, like last time she was admitted.  Patient also reported symptoms of ideas of reference, stated that she receives confirmation of her good deeds from God through the TV and radio. Stated that currently she is not able to hear God's voice or see visions from him over the past month, which disappoints her. Patient also spends majority of her time binge watching online sermons for hours (~7hr/d). Patient reported also having poor appetite, she has lost over 30 pounds within the last few months.    Diagnosis:  Final diagnoses:  Schizophreniform psychosis (HCC)    Total Time spent with patient: 30 minutes  Past Psychiatric History: History of psychosis  Past Medical History:  Past Medical History:  Diagnosis Date   Asthma    History of admission to inpatient psychiatry department 12/12/2022   [facility] 10/2022   History reviewed. No pertinent surgical history. Family History: History reviewed. No pertinent family history. Family Psychiatric  History: No reported history.  Social History:  Social History   Substance and Sexual Activity  Alcohol Use None     Social History   Substance and Sexual Activity  Drug Use Not on file    Social History   Socioeconomic History   Marital status: Married    Spouse name: Not on file   Number of children: Not on file   Years of education: Not on file   Highest education level: Not on file  Occupational History   Not on file  Tobacco Use   Smoking status: Unknown   Smokeless tobacco: Not on file  Substance and Sexual Activity   Alcohol use: Not on file   Drug use: Not on file   Sexual activity: Not on file  Other Topics Concern   Not on file  Social History Narrative   Not on file    Social Determinants of Health   Financial Resource Strain: Not on file  Food Insecurity: Not on file  Transportation Needs: Not on file  Physical Activity: Not on file  Stress: Not on file  Social Connections: Not on file    Current Medications:  Current Facility-Administered Medications  Medication Dose Route Frequency Provider Last Rate Last Admin   acetaminophen (TYLENOL) tablet 650 mg  650 mg Oral Q6H PRN Princess Bruins, DO       albuterol (VENTOLIN HFA) 108 (90 Base) MCG/ACT inhaler 1-2 puff  1-2 puff Inhalation Q6H PRN Princess Bruins, DO       alum & mag hydroxide-simeth (MAALOX/MYLANTA) 200-200-20 MG/5ML suspension 30 mL  30 mL Oral Q4H PRN Princess Bruins, DO       benztropine (COGENTIN) tablet 1 mg  1 mg Oral BID PRN Dellis Voght L, NP       calamine lotion 1 Application  1 Application Topical PRN Princess Bruins, DO       cyanocobalamin (VITAMIN B12) tablet 1,000 mcg  1,000 mcg Oral Daily Princess Bruins, DO  1,000 mcg at 12/13/22 0906   diphenhydrAMINE (BENADRYL) 50 MG capsule            EPINEPHrine (EPI-PEN) injection 0.3 mg  0.3 mg Intramuscular PRN Merrily Brittle, DO       hydrOXYzine (ATARAX) tablet 25 mg  25 mg Oral TID PRN Merrily Brittle, DO   25 mg at 12/12/22 2155   loratadine (CLARITIN) tablet 10 mg  10 mg Oral QHS Merrily Brittle, DO   10 mg at 12/12/22 2200   magnesium hydroxide (MILK OF MAGNESIA) suspension 30 mL  30 mL Oral Daily PRN Merrily Brittle, DO       multivitamin with minerals tablet 1 tablet  1 tablet Oral Daily Merrily Brittle, DO   1 tablet at 12/13/22 [id number]   paliperidone (INVEGA) 24 hr tablet 3 mg  3 mg Oral QHS Merrily Brittle, DO   3 mg at 12/12/22 2154   Cayla Wiegand petrolatum (VASELINE) gel   Topical PRN Merrily Brittle, DO       Current Outpatient Medications  Medication Sig Dispense Refill   albuterol (VENTOLIN HFA) 108 (90 Base) MCG/ACT inhaler Inhale into the lungs every 6 (six) hours as needed for wheezing or shortness of breath.     ASHWAGANDHA PO Take 1  capsule by mouth daily.     cyanocobalamin 1000 MCG tablet Take 1,000 mcg by mouth daily.     elvitegravir-cobicistat-emtricitabine-tenofovir (GENVOYA) 150-150-200-10 MG TABS tablet Take 1 tablet by mouth daily with breakfast. 30 tablet 0   EPINEPHrine 0.3 mg/0.3 mL IJ SOAJ injection Inject 0.3 mg into the muscle as needed for anaphylaxis.     Multiple Vitamin (MULTIVITAMIN WITH MINERALS) TABS tablet Take 1 tablet by mouth daily.     paliperidone (INVEGA) 3 MG 24 hr tablet Take 1 tablet (3 mg total) by mouth daily.      Labs  Lab Results:  Admission on 12/12/2022  Component Date Value Ref Range Status   SARS Coronavirus 2 by RT PCR 12/12/2022 NEGATIVE  NEGATIVE Final   Comment: (NOTE) SARS-CoV-2 target nucleic acids are NOT DETECTED.  The SARS-CoV-2 RNA is generally detectable in upper respiratory specimens during the acute phase of infection. The lowest concentration of SARS-CoV-2 viral copies this assay can detect is 138 copies/mL. A negative result does not preclude SARS-Cov-2 infection and should not be used as the sole basis for treatment or other patient management decisions. A negative result may occur with  improper specimen collection/handling, submission of specimen other than nasopharyngeal swab, presence of viral mutation(s) within the areas targeted by this assay, and inadequate number of viral copies(<138 copies/mL). A negative result must be combined with clinical observations, patient history, and epidemiological information. The expected result is Negative.  Fact Sheet for Patients:  EntrepreneurPulse.com.au  Fact Sheet for Healthcare Providers:  IncredibleEmployment.be  This test is no                          t yet approved or cleared by the [place] FDA and  has been authorized for detection and/or diagnosis of SARS-CoV-2 by FDA under an Emergency Use Authorization (EUA). This EUA will remain  in effect (meaning this  test can be used) for the duration of the COVID-19 declaration under Section 564(b)(1) of the Act, 21 U.S.C.section 360bbb-3(b)(1), unless the authorization is terminated  or revoked sooner.       Influenza A by PCR 12/12/2022 NEGATIVE  NEGATIVE Final   Influenza B by PCR 12/12/2022  NEGATIVE  NEGATIVE Final   Comment: (NOTE) The Xpert Xpress SARS-CoV-2/FLU/RSV plus assay is intended as an aid in the diagnosis of influenza from Nasopharyngeal swab specimens and should not be used as a sole basis for treatment. Nasal washings and aspirates are unacceptable for Xpert Xpress SARS-CoV-2/FLU/RSV testing.  Fact Sheet for Patients: EntrepreneurPulse.com.au  Fact Sheet for Healthcare Providers: IncredibleEmployment.be  This test is not yet approved or cleared by the [place] FDA and has been authorized for detection and/or diagnosis of SARS-CoV-2 by FDA under an Emergency Use Authorization (EUA). This EUA will remain in effect (meaning this test can be used) for the duration of the COVID-19 declaration under Section 564(b)(1) of the Act, 21 U.S.C. section 360bbb-3(b)(1), unless the authorization is terminated or revoked.     Resp Syncytial Virus by PCR 12/12/2022 NEGATIVE  NEGATIVE Final   Comment: (NOTE) Fact Sheet for Patients: EntrepreneurPulse.com.au  Fact Sheet for Healthcare Providers: IncredibleEmployment.be  This test is not yet approved or cleared by the [place] FDA and has been authorized for detection and/or diagnosis of SARS-CoV-2 by FDA under an Emergency Use Authorization (EUA). This EUA will remain in effect (meaning this test can be used) for the duration of the COVID-19 declaration under Section 564(b)(1) of the Act, 21 U.S.C. section 360bbb-3(b)(1), unless the authorization is terminated or revoked.  Performed at [facility], [place] [address] [place], [place] [address]     WBC 12/12/2022 5.8  4.0 - 10.5 K/uL Final   RBC 12/12/2022 4.69  3.87 - 5.11 MIL/uL Final   Hemoglobin 12/12/2022 14.6  12.0 - 15.0 g/dL Final   HCT 12/12/2022 44.0  36.0 - 46.0 % Final   MCV 12/12/2022 93.8  80.0 - 100.0 fL Final   MCH 12/12/2022 31.1  26.0 - 34.0 pg Final   MCHC 12/12/2022 33.2  30.0 - 36.0 g/dL Final   RDW 12/12/2022 14.1  11.5 - 15.5 % Final   Platelets 12/12/2022 265  150 - 400 K/uL Final   nRBC 12/12/2022 0.0  0.0 - 0.2 % Final   Neutrophils Relative % 12/12/2022 52  % Final   Neutro Abs 12/12/2022 3.0  1.7 - 7.7 K/uL Final   Lymphocytes Relative 12/12/2022 29  % Final   Lymphs Abs 12/12/2022 1.6  0.7 - 4.0 K/uL Final   Monocytes Relative 12/12/2022 8  % Final   Monocytes Absolute 12/12/2022 0.4  0.1 - 1.0 K/uL Final   Eosinophils Relative 12/12/2022 11  % Final   Eosinophils Absolute 12/12/2022 0.6 (H)  0.0 - 0.5 K/uL Final   Basophils Relative 12/12/2022 0  % Final   Basophils Absolute 12/12/2022 0.0  0.0 - 0.1 K/uL Final   Immature Granulocytes 12/12/2022 0  % Final   Abs Immature Granulocytes 12/12/2022 0.01  0.00 - 0.07 K/uL Final   Performed at [facility], [place] [address] [place], [place] [address]   Sodium 12/12/2022 141  135 - 145 mmol/L Final   Potassium 12/12/2022 4.1  3.5 - 5.1 mmol/L Final   Chloride 12/12/2022 107  98 - 111 mmol/L Final   CO2 12/12/2022 24  22 - 32 mmol/L Final   Glucose, Bld 12/12/2022 84  70 - 99 mg/dL Final   Glucose reference range applies only to samples taken after fasting for at least 8 hours.   BUN 12/12/2022 14  6 - 20 mg/dL Final   Creatinine, Ser 12/12/2022 0.94  0.44 - 1.00 mg/dL Final   Calcium 12/12/2022 10.3  8.9 - 10.3 mg/dL Final   Total Protein 12/12/2022 8.1  6.5 - 8.1 g/dL Final   Albumin 12/12/2022 4.8  3.5 - 5.0 g/dL Final   AST 12/12/2022 36  15 - 41 U/L Final   ALT 12/12/2022 25  0 - 44 U/L Final   Alkaline Phosphatase 12/12/2022 40  38 - 126 U/L Final   Total Bilirubin 12/12/2022 0.4  0.3 - 1.2  mg/dL Final   GFR, Estimated 12/12/2022 >60  >60 mL/min Final   Comment: (NOTE) Calculated using the CKD-EPI Creatinine Equation (2021)    Anion gap 12/12/2022 10  5 - 15 Final   Performed at [facility], [place] [address], [place], [place] [address]   TSH 12/12/2022 3.326  0.350 - 4.500 uIU/mL Final   Comment: Performed by a 3rd Generation assay with a functional sensitivity of <=0.01 uIU/mL. Performed at [facility], [place] [address] [place], [place] [address]    Color, Urine 12/12/2022 YELLOW  YELLOW Final   APPearance 12/12/2022 CLEAR  CLEAR Final   Specific Gravity, Urine 12/12/2022 1.026  1.005 - 1.030 Final   pH 12/12/2022 6.0  5.0 - 8.0 Final   Glucose, UA 12/12/2022 NEGATIVE  NEGATIVE mg/dL Final   Hgb urine dipstick 12/12/2022 NEGATIVE  NEGATIVE Final   Bilirubin Urine 12/12/2022 NEGATIVE  NEGATIVE Final   Ketones, ur 12/12/2022 20 (A)  NEGATIVE mg/dL Final   Protein, ur 12/12/2022 100 (A)  NEGATIVE mg/dL Final   Nitrite 12/12/2022 NEGATIVE  NEGATIVE Final   Leukocytes,Ua 12/12/2022 NEGATIVE  NEGATIVE Final   RBC / HPF 12/12/2022 0-5  0 - 5 RBC/hpf Final   WBC, UA 12/12/2022 0-5  0 - 5 WBC/hpf Final   Bacteria, UA 12/12/2022 RARE (A)  NONE SEEN Final   Squamous Epithelial / LPF 12/12/2022 0-5  0 - 5 /HPF Final   Mucus 12/12/2022 PRESENT   Final   Performed at [facility], [place] [address], [place], [place] [address]   POC Amphetamine UR 12/12/2022 None Detected  NONE DETECTED (Cut Off Level 1000 ng/mL) Final   POC Secobarbital (BAR) 12/12/2022 None Detected  NONE DETECTED (Cut Off Level 300 ng/mL) Final   POC Buprenorphine (BUP) 12/12/2022 None Detected  NONE DETECTED (Cut Off Level 10 ng/mL) Final   POC Oxazepam (BZO) 12/12/2022 None Detected  NONE DETECTED (Cut Off Level 300 ng/mL) Final   POC Cocaine UR 12/12/2022 None Detected  NONE DETECTED (Cut Off Level 300 ng/mL) Final   POC Methamphetamine UR 12/12/2022 None Detected  NONE DETECTED (Cut Off Level  1000 ng/mL) Final   POC Morphine 12/12/2022 None Detected  NONE DETECTED (Cut Off Level 300 ng/mL) Final   POC Methadone UR 12/12/2022 None Detected  NONE DETECTED (Cut Off Level 300 ng/mL) Final   POC Oxycodone UR 12/12/2022 None Detected  NONE DETECTED (Cut Off Level 100 ng/mL) Final   POC Marijuana UR 12/12/2022 None Detected  NONE DETECTED (Cut Off Level 50 ng/mL) Final   T3, Free 12/12/2022 3.3  2.0 - 4.4 pg/mL Final   Comment: (NOTE) Performed At: [facility] [place], [place] [id number] Rush Farmer MD [id number]    Free T4 12/12/2022 0.93  0.61 - 1.12 ng/dL Final   Comment: (NOTE) Biotin ingestion may interfere with free T4 tests. If the results are inconsistent with the TSH level, previous test results, or the clinical presentation, then consider biotin interference. If needed, order repeat testing after stopping biotin. Performed at [facility], [address] [place], [place] [address]    HIV Screen 4th Generation wRfx 12/12/2022 Non Reactive  Non Reactive Final   Performed at [facility], [address], [place], [place] [address]   SARSCOV2ONAVIRUS 2 AG 12/12/2022 NEGATIVE  NEGATIVE Final   Comment: (NOTE) SARS-CoV-2 antigen NOT DETECTED.   Negative results are presumptive.  Negative results do not preclude SARS-CoV-2 infection and should not be used as the sole basis for treatment or other patient management decisions, including infection  control decisions, particularly in the presence of clinical signs and  symptoms consistent with COVID-19, or in those who have been in contact with the virus.  Negative results must be combined with clinical observations, patient history, and epidemiological information. The expected result is Negative.  Fact Sheet for Patients: https://www.jennings-kim.com/  Fact Sheet for Healthcare Providers: https://alexander-rogers.biz/  This test is not yet approved or  cleared by the [place] FDA and  has been authorized for detection and/or diagnosis of SARS-CoV-2 by FDA under an Emergency Use Authorization (EUA).  This EUA will remain in effect (meaning this test can be used) for the duration of  the COV                          ID-19 declaration under Section 564(b)(1) of the Act, 21 U.S.C. section 360bbb-3(b)(1), unless the authorization is terminated or revoked sooner.    Admission on 11/07/2022, Discharged on 11/08/2022  Component Date Value Ref Range Status   SARS Coronavirus 2 by RT PCR 11/07/2022 NEGATIVE  NEGATIVE Final   Comment: (NOTE) SARS-CoV-2 target nucleic acids are NOT DETECTED.  The SARS-CoV-2 RNA is generally detectable in upper respiratory specimens during the acute phase of infection. The lowest concentration of SARS-CoV-2 viral copies this assay can detect is 138 copies/mL. A negative result does not preclude SARS-Cov-2 infection and should not be used as the sole basis for treatment or other patient management decisions. A negative result may occur with  improper specimen collection/handling, submission of specimen other than nasopharyngeal swab, presence of viral mutation(s) within the areas targeted by this assay, and inadequate number of viral copies(<138 copies/mL). A negative result must be combined with clinical observations, patient history, and epidemiological information. The expected result is Negative.  Fact Sheet for Patients:  BloggerCourse.com  Fact Sheet for Healthcare Providers:  SeriousBroker.it  This test is no                          t yet approved or cleared by the [place] FDA and  has been authorized for detection and/or diagnosis of SARS-CoV-2 by FDA under an Emergency Use Authorization (EUA). This EUA will remain  in effect (meaning this test can be used) for the duration of the COVID-19 declaration under Section 564(b)(1) of the Act,  21 U.S.C.section 360bbb-3(b)(1), unless the authorization is terminated  or revoked sooner.       Influenza A by PCR 11/07/2022 NEGATIVE  NEGATIVE Final   Influenza B by PCR 11/07/2022 NEGATIVE  NEGATIVE Final   Comment: (NOTE) The Xpert Xpress SARS-CoV-2/FLU/RSV plus assay is intended as an aid in the diagnosis of influenza from Nasopharyngeal swab specimens and should not be used as a sole basis for treatment. Nasal washings and aspirates are unacceptable for Xpert Xpress SARS-CoV-2/FLU/RSV testing.  Fact Sheet for Patients: BloggerCourse.com  Fact Sheet for Healthcare Providers: SeriousBroker.it  This test is not  yet approved or cleared by the [place] and has been authorized for detection and/or diagnosis of SARS-CoV-2 by FDA under an Emergency Use Authorization (EUA). This EUA will remain in effect (meaning this test can be used) for the duration of the COVID-19 declaration under Section 564(b)(1) of the Act, 21 U.S.C. section 360bbb-3(b)(1), unless the authorization is terminated or revoked.  Performed at [facility], [place] [address] [place], [place] [address]    WBC 11/07/2022 6.6  4.0 - 10.5 K/uL Final   RBC 11/07/2022 4.68  3.87 - 5.11 MIL/uL Final   Hemoglobin 11/07/2022 14.6  12.0 - 15.0 g/dL Final   HCT 11/07/2022 44.8  36.0 - 46.0 % Final   MCV 11/07/2022 95.7  80.0 - 100.0 fL Final   MCH 11/07/2022 31.2  26.0 - 34.0 pg Final   MCHC 11/07/2022 32.6  30.0 - 36.0 g/dL Final   RDW 11/07/2022 13.9  11.5 - 15.5 % Final   Platelets 11/07/2022 282  150 - 400 K/uL Final   nRBC 11/07/2022 0.0  0.0 - 0.2 % Final   Neutrophils Relative % 11/07/2022 46  % Final   Neutro Abs 11/07/2022 3.0  1.7 - 7.7 K/uL Final   Lymphocytes Relative 11/07/2022 39  % Final   Lymphs Abs 11/07/2022 2.6  0.7 - 4.0 K/uL Final   Monocytes Relative 11/07/2022 7  % Final   Monocytes Absolute 11/07/2022 0.5  0.1 - 1.0 K/uL Final    Eosinophils Relative 11/07/2022 7  % Final   Eosinophils Absolute 11/07/2022 0.5  0.0 - 0.5 K/uL Final   Basophils Relative 11/07/2022 1  % Final   Basophils Absolute 11/07/2022 0.0  0.0 - 0.1 K/uL Final   Immature Granulocytes 11/07/2022 0  % Final   Abs Immature Granulocytes 11/07/2022 0.02  0.00 - 0.07 K/uL Final   Performed at [facility], [place] [address] [place], [place] [address]   Sodium 11/07/2022 141  135 - 145 mmol/L Final   Potassium 11/07/2022 4.1  3.5 - 5.1 mmol/L Final   Chloride 11/07/2022 104  98 - 111 mmol/L Final   CO2 11/07/2022 23  22 - 32 mmol/L Final   Glucose, Bld 11/07/2022 70  70 - 99 mg/dL Final   Glucose reference range applies only to samples taken after fasting for at least 8 hours.   BUN 11/07/2022 15  6 - 20 mg/dL Final   Creatinine, Ser 11/07/2022 0.80  0.44 - 1.00 mg/dL Final   Calcium 11/07/2022 10.3  8.9 - 10.3 mg/dL Final   Total Protein 11/07/2022 7.9  6.5 - 8.1 g/dL Final   Albumin 11/07/2022 4.7  3.5 - 5.0 g/dL Final   AST 11/07/2022 28  15 - 41 U/L Final   ALT 11/07/2022 22  0 - 44 U/L Final   Alkaline Phosphatase 11/07/2022 39  38 - 126 U/L Final   Total Bilirubin 11/07/2022 0.8  0.3 - 1.2 mg/dL Final   GFR, Estimated 11/07/2022 >60  >60 mL/min Final   Comment: (NOTE) Calculated using the CKD-EPI Creatinine Equation (2021)    Anion gap 11/07/2022 14  5 - 15 Final   Performed at [place] [address], [place], [place] [address]   Hgb A1c MFr Bld 11/07/2022 5.8 (H)  4.8 - 5.6 % Final   Comment: (NOTE)         Prediabetes: 5.7 - 6.4         Diabetes: >6.4  Glycemic control for adults with diabetes: <7.0    Mean Plasma Glucose 11/07/2022 120  mg/dL Final   Comment: (NOTE) Performed At: [facility] [place], [place] [id number] Rush Farmer MD [id number]    Alcohol, Ethyl (B) 11/07/2022 <10  <10 mg/dL Final   Comment: (NOTE) Lowest detectable limit for serum alcohol is 10  mg/dL.  For medical purposes only. Performed at [facility], [place] [address], [place], [place] [address]    Prolactin 11/07/2022 12.7  4.8 - 23.3 ng/mL Final   Comment: (NOTE) **Effective December 01, 2022 Prolactin reference**  interval will be changing to:             Age           Female (ng/mL)  Female (ng/mL)          0 - 30 days       4.3 - 32.8     4.5 - 24.4          1 -  6 mos        5.4 - 51.4     5.4 - 51.4         44m -  1 yrs        5.3 - 28.9     4.8 - 33.4          2 - 12 yrs        1.8 - 44.2     4.8 - 33.4         13 - 30 yrs        3.6 - 31.5     4.8 - 33.4         31 - 50 yrs        3.9 - 22.7     4.8 - 33.4         51 - 80 yrs        3.6 - 25.2     3.6 - 25.2             >80 yrs        3.6 - 32.0     3.6 - 32.0 Performed At: [facility] [place], [place] [id number] Rush Farmer MD [id number]    POC Amphetamine UR 11/07/2022 None Detected  NONE DETECTED (Cut Off Level 1000 ng/mL) Preliminary   POC Secobarbital (BAR) 11/07/2022 None Detected  NONE DETECTED (Cut Off Level 300 ng/mL) Preliminary   POC Buprenorphine (BUP) 11/07/2022 None Detected  NONE DETECTED (Cut Off Level 10 ng/mL) Preliminary   POC Oxazepam (BZO) 11/07/2022 None Detected  NONE DETECTED (Cut Off Level 300 ng/mL) Preliminary   POC Cocaine UR 11/07/2022 None Detected  NONE DETECTED (Cut Off Level 300 ng/mL) Preliminary   POC Methamphetamine UR 11/07/2022 None Detected  NONE DETECTED (Cut Off Level 1000 ng/mL) Preliminary   POC Morphine 11/07/2022 None Detected  NONE DETECTED (Cut Off Level 300 ng/mL) Preliminary   POC Methadone UR 11/07/2022 None Detected  NONE DETECTED (Cut Off Level 300 ng/mL) Preliminary   POC Oxycodone UR 11/07/2022 None Detected  NONE DETECTED (Cut Off Level 100 ng/mL) Preliminary   POC Marijuana UR 11/07/2022 None Detected  NONE DETECTED (Cut Off Level 50 ng/mL) Preliminary   Color, Urine 11/07/2022 YELLOW  YELLOW Final   APPearance 11/07/2022  HAZY (A)  CLEAR Final   Specific Gravity, Urine 11/07/2022 1.025  1.005 - 1.030 Final   pH 11/07/2022 5.0  5.0 - 8.0  Final   Glucose, UA 11/07/2022 NEGATIVE  NEGATIVE mg/dL Final   Hgb urine dipstick 11/07/2022 NEGATIVE  NEGATIVE Final   Bilirubin Urine 11/07/2022 NEGATIVE  NEGATIVE Final   Ketones, ur 11/07/2022 80 (A)  NEGATIVE mg/dL Final   Protein, ur 11/07/2022 NEGATIVE  NEGATIVE mg/dL Final   Nitrite 11/07/2022 NEGATIVE  NEGATIVE Final   Leukocytes,Ua 11/07/2022 LARGE (A)  NEGATIVE Final   RBC / HPF 11/07/2022 0-5  0 - 5 RBC/hpf Final   WBC, UA 11/07/2022 11-20  0 - 5 WBC/hpf Final   Bacteria, UA 11/07/2022 NONE SEEN  NONE SEEN Final   Squamous Epithelial / LPF 11/07/2022 0-5  0 - 5 Final   Mucus 11/07/2022 PRESENT   Final   Performed at [facility], [place] [address], [place], [place] [address]   SARSCOV2ONAVIRUS 2 AG 11/07/2022 NEGATIVE  NEGATIVE Final   Comment: (NOTE) SARS-CoV-2 antigen NOT DETECTED.   Negative results are presumptive.  Negative results do not preclude SARS-CoV-2 infection and should not be used as the sole basis for treatment or other patient management decisions, including infection  control decisions, particularly in the presence of clinical signs and  symptoms consistent with COVID-19, or in those who have been in contact with the virus.  Negative results must be combined with clinical observations, patient history, and epidemiological information. The expected result is Negative.  Fact Sheet for Patients: HandmadeRecipes.com.cy  Fact Sheet for Healthcare Providers: FuneralLife.at  This test is not yet approved or cleared by the [place] FDA and  has been authorized for detection and/or diagnosis of SARS-CoV-2 by FDA under an Emergency Use Authorization (EUA).  This EUA will remain in effect (meaning this test can be used) for the duration of  the COV                          ID-19 declaration  under Section 564(b)(1) of the Act, 21 U.S.C. section 360bbb-3(b)(1), unless the authorization is terminated or revoked sooner.     Preg Test, Ur 11/07/2022 NEGATIVE  NEGATIVE Final   Comment:        THE SENSITIVITY OF THIS METHODOLOGY IS >24 mIU/mL    Cholesterol 11/07/2022 240 (H)  0 - 200 mg/dL Final   Triglycerides 11/07/2022 36  <150 mg/dL Final   HDL 11/07/2022 105  >40 mg/dL Final   Total CHOL/HDL Ratio 11/07/2022 2.3  RATIO Final   VLDL 11/07/2022 7  0 - 40 mg/dL Final   LDL Cholesterol 11/07/2022 128 (H)  0 - 99 mg/dL Final   Comment:        Total Cholesterol/HDL:CHD Risk Coronary Heart Disease Risk Table                     Men   Women  1/2 Average Risk   3.4   3.3  Average Risk       5.0   4.4  2 X Average Risk   9.6   7.1  3 X Average Risk  23.4   11.0        Use the calculated Patient Ratio above and the CHD Risk Table to determine the patient's CHD Risk.        ATP III CLASSIFICATION (LDL):  <100     mg/dL   Optimal  100-129  mg/dL   Near or Above  Optimal  130-159  mg/dL   Borderline  160-189  mg/dL   High  >190     mg/dL   Very High Performed at [place] [address], [place], [place] [address]    TSH 11/07/2022 4.152  0.350 - 4.500 uIU/mL Final   Comment: Performed by a 3rd Generation assay with a functional sensitivity of <=0.01 uIU/mL. Performed at [facility], [place] [address] [place], [place] [address]   Admission on 10/28/2022, Discharged on 10/29/2022  Component Date Value Ref Range Status   WBC 10/28/2022 5.3  4.0 - 10.5 K/uL Final   RBC 10/28/2022 4.38  3.87 - 5.11 MIL/uL Final   Hemoglobin 10/28/2022 13.7  12.0 - 15.0 g/dL Final   HCT 10/28/2022 41.4  36.0 - 46.0 % Final   MCV 10/28/2022 94.5  80.0 - 100.0 fL Final   MCH 10/28/2022 31.3  26.0 - 34.0 pg Final   MCHC 10/28/2022 33.1  30.0 - 36.0 g/dL Final   RDW 10/28/2022 13.5  11.5 - 15.5 % Final   Platelets 10/28/2022 301  150 - 400 K/uL Final   nRBC  10/28/2022 0.0  0.0 - 0.2 % Final   Neutrophils Relative % 10/28/2022 52  % Final   Neutro Abs 10/28/2022 2.8  1.7 - 7.7 K/uL Final   Lymphocytes Relative 10/28/2022 33  % Final   Lymphs Abs 10/28/2022 1.8  0.7 - 4.0 K/uL Final   Monocytes Relative 10/28/2022 9  % Final   Monocytes Absolute 10/28/2022 0.5  0.1 - 1.0 K/uL Final   Eosinophils Relative 10/28/2022 5  % Final   Eosinophils Absolute 10/28/2022 0.3  0.0 - 0.5 K/uL Final   Basophils Relative 10/28/2022 1  % Final   Basophils Absolute 10/28/2022 0.0  0.0 - 0.1 K/uL Final   Immature Granulocytes 10/28/2022 0  % Final   Abs Immature Granulocytes 10/28/2022 0.01  0.00 - 0.07 K/uL Final   Performed at [facility], [place] [address], [place], [place] [address]   Sodium 10/28/2022 140  135 - 145 mmol/L Final   Potassium 10/28/2022 3.2 (L)  3.5 - 5.1 mmol/L Final   Chloride 10/28/2022 103  98 - 111 mmol/L Final   CO2 10/28/2022 25  22 - 32 mmol/L Final   Glucose, Bld 10/28/2022 99  70 - 99 mg/dL Final   Glucose reference range applies only to samples taken after fasting for at least 8 hours.   BUN 10/28/2022 17  6 - 20 mg/dL Final   Creatinine, Ser 10/28/2022 1.02 (H)  0.44 - 1.00 mg/dL Final   Calcium 10/28/2022 10.0  8.9 - 10.3 mg/dL Final   Total Protein 10/28/2022 7.1  6.5 - 8.1 g/dL Final   Albumin 10/28/2022 4.1  3.5 - 5.0 g/dL Final   AST 10/28/2022 25  15 - 41 U/L Final   ALT 10/28/2022 25  0 - 44 U/L Final   Alkaline Phosphatase 10/28/2022 38  38 - 126 U/L Final   Total Bilirubin 10/28/2022 0.6  0.3 - 1.2 mg/dL Final   GFR, Estimated 10/28/2022 >60  >60 mL/min Final   Comment: (NOTE) Calculated using the CKD-EPI Creatinine Equation (2021)    Anion gap 10/28/2022 12  5 - 15 Final   Performed at [place] [address], [place], [place] [address]   Color, Urine 10/28/2022 AMBER (A)  YELLOW Final   BIOCHEMICALS MAY BE AFFECTED BY COLOR   APPearance 10/28/2022 CLOUDY (A)  CLEAR Final   Specific Gravity,  Urine 10/28/2022 1.031 (H)  1.005 - 1.030 Final   pH 10/28/2022 5.0  5.0 - 8.0 Final   Glucose, UA 10/28/2022 NEGATIVE  NEGATIVE mg/dL Final   Hgb urine dipstick 10/28/2022 NEGATIVE  NEGATIVE Final   Bilirubin Urine 10/28/2022 NEGATIVE  NEGATIVE Final   Ketones, ur 10/28/2022 NEGATIVE  NEGATIVE mg/dL Final   Protein, ur 10/28/2022 100 (A)  NEGATIVE mg/dL Final   Nitrite 10/28/2022 NEGATIVE  NEGATIVE Final   Leukocytes,Ua 10/28/2022 LARGE (A)  NEGATIVE Final   RBC / HPF 10/28/2022 6-10  0 - 5 RBC/hpf Final   WBC, UA 10/28/2022 21-50  0 - 5 WBC/hpf Final   Bacteria, UA 10/28/2022 MANY (A)  NONE SEEN Final   Squamous Epithelial / LPF 10/28/2022 21-50  0 - 5 Final   Mucus 10/28/2022 PRESENT   Final   Non Squamous Epithelial 10/28/2022 0-5 (A)  NONE SEEN Final   Performed at [facility], [place] [address], [place], [place] [address]   Opiates 10/28/2022 NONE DETECTED  NONE DETECTED Final   Cocaine 10/28/2022 NONE DETECTED  NONE DETECTED Final   Benzodiazepines 10/28/2022 NONE DETECTED  NONE DETECTED Final   Amphetamines 10/28/2022 NONE DETECTED  NONE DETECTED Final   Tetrahydrocannabinol 10/28/2022 NONE DETECTED  NONE DETECTED Final   Barbiturates 10/28/2022 NONE DETECTED  NONE DETECTED Final   Comment: (NOTE) DRUG SCREEN FOR MEDICAL PURPOSES ONLY.  IF CONFIRMATION IS NEEDED FOR ANY PURPOSE, NOTIFY LAB WITHIN 5 DAYS.  LOWEST DETECTABLE LIMITS FOR URINE DRUG SCREEN Drug Class                     Cutoff (ng/mL) Amphetamine and metabolites    1000 Barbiturate and metabolites    200 Benzodiazepine                 200 Opiates and metabolites        300 Cocaine and metabolites        300 THC                            50 Performed at [facility], [place] [address] [place], [place] [address]    Alcohol, Ethyl (B) 10/28/2022 <10  <10 mg/dL Final   Comment: (NOTE) Lowest detectable limit for serum alcohol is 10 mg/dL.  For medical purposes only. Performed at [facility], [place] [address] [place], [place] [address]    Sed Rate 10/29/2022 0  0 - 22 mm/hr Final   Performed at [place] [address], [place], [place] [address]   TSH 10/29/2022 1.608  0.350 - 4.500 uIU/mL Final   Comment: Performed by a 3rd Generation assay with a functional sensitivity of <=0.01 uIU/mL. Performed at [facility], [place] [address], [place], [place] [address]    RPR Ser Ql 10/29/2022 NON REACTIVE  NON REACTIVE Final   Performed at [facility], [place] [address], [place], [place] [address]    Blood Alcohol level:  Lab Results  Component Value Date   [facility] <10 11/07/2022   ETH <10 [id number]    Metabolic Disorder Labs: Lab Results  Component Value Date   HGBA1C 5.8 (H) 11/07/2022   MPG 120 11/07/2022   Lab Results  Component Value Date   PROLACTIN 12.7 11/07/2022   Lab Results  Component Value Date   CHOL 240 (H)  11/07/2022   TRIG 36 11/07/2022   HDL 105 11/07/2022   CHOLHDL 2.3 11/07/2022   VLDL 7 11/07/2022   LDLCALC 128 (H) 11/07/2022    Therapeutic Lab Levels: No results found for: "LITHIUM" No results found for: "VALPROATE" No results found for: "CBMZ"  Physical Findings   Flowsheet Row [facility] from 11/07/2022 in [facility] ED from 10/28/2022 in [place] No Risk No Risk No Risk        Musculoskeletal  Strength & Muscle Tone: within normal limits Gait & Station: normal Patient leans: N/A  Psychiatric Specialty Exam  Presentation  General Appearance:  Appropriate for Environment  Eye Contact: Fair  Speech: Clear and Coherent  Speech Volume: Decreased  Handedness: Right   Mood and Affect  Mood: Dysphoric  Affect: Appropriate   Thought Process  Thought Processes: Disorganized  Descriptions of Associations:Circumstantial  Orientation:Full (Time,  Place and Person)  Thought Content:Illogical; Perseveration; Rumination; Tangential; Delusions  Diagnosis of Schizophrenia or Schizoaffective disorder in past: No    Hallucinations:Hallucinations: None  Ideas of Reference:Paranoia; Delusions  Suicidal Thoughts:Suicidal Thoughts: No  Homicidal Thoughts:Homicidal Thoughts: No   Sensorium  Memory: Immediate Good; Recent Good  Judgment: Impaired  Insight: Poor   Executive Functions  Concentration: Fair  Attention Span: Fair  Recall: [place] of Knowledge: Fair  Language: Fair   Psychomotor Activity  Psychomotor Activity: Psychomotor Activity: Restlessness   Assets  Assets: Communication Skills; Desire for Improvement; Financial Resources/Insurance; Housing; Intimacy; Physical Health; Leisure Time; Social Support   Sleep  Sleep: Sleep: Poor   Nutritional Assessment (For OBS and FBC admissions only) Has the patient had a weight loss or gain of 10 pounds or more in the last 3 months?: Yes Has the patient had a decrease in food intake/or appetite?: Yes Does the patient have dental problems?: No Does the patient have eating habits or behaviors that may be indicators of an eating disorder including binging or inducing vomiting?: No Has the patient recently lost weight without trying?: 3 Has the patient been eating poorly because of a decreased appetite?: 1 Malnutrition Screening Tool Score: 4 Nutritional Assessment Referrals: Medication/Tx changes    Physical Exam  Physical Exam HENT:     Mouth/Throat:     Comments: No tongue swelling noted. Patient able to swallow on exam.  Cardiovascular:     Rate and Rhythm: Normal rate.  Pulmonary:     Effort: Pulmonary effort is normal.  Musculoskeletal:        General: Normal range of motion.     Cervical back: Normal range of motion.  Skin:    Comments: Mild atopic dermatitis to face    Neurological:     Mental Status: She is alert and oriented to  person, place, and time.   Review of Systems  HENT:         "Difficulty swallowing, using more effort to swallow this morning." Eczema to face for a couple of months."  Respiratory: Negative.    Cardiovascular: Negative.    Blood pressure 102/67, pulse 99, temperature 97.7 F (36.5 C), temperature source Oral, resp. rate 16, last menstrual period 09/08/2012, SpO2 98 %. There is no height or weight on file to calculate BMI.  Treatment Plan Summary: Patient is recommended for inpatient psychiatric treatment for psychosis. Patient is voluntary. CSW to seek appropriate placement for inpatient psychiatric treatment.  Current medication regimen  Continue Invega 3 mg p.o. nightly, patient previously tolerated medication with good effectiveness and no side effects Add Cogentin 1 mg p.o. twice daily as needed for EPS  Marissa Calamity, NP 12/13/2022 9:53 AM

## 2022-12-13 NOTE — ED Notes (Signed)
Pt watching tv with peers.. calm @ present time

## 2022-12-13 NOTE — ED Notes (Signed)
Pt is awake stating she can't sleep and want a cup of coffee. Pt did not realize the time. She is asking for a medication to help her sleep pt is restless. Pt calm and cooperative. Some c/o distress. Will continue to monitor for safety

## 2022-12-13 NOTE — ED Notes (Signed)
Pt sleeping at present, no distress noted.  Monitoring for safety. 

## 2022-12-13 NOTE — ED Notes (Signed)
Awake, alert/oriented @ present time. Talking with peer. Resp even and unlabored. Will continue to monitor for safety

## 2022-12-13 NOTE — ED Notes (Addendum)
Pt remains awake talking to self and repeating "stop," states she cannot sleep and now wants to go home because she cannot get any rest. Cecilio Asper, NP notified.

## 2022-12-13 NOTE — ED Notes (Signed)
Pt resting in bed, talking to herself. Pt will occasionally cover her ears and say "stop." No signs of acute distress noted. Monitoring for safety.

## 2022-12-13 NOTE — ED Provider Notes (Cosign Needed)
I spoke to the patient's husband to provider an update. He was informed that the patient is presenting psychotic, did not sleep last night and was observed up in the middle of the night talking to herself. He states that his wife wants to leave and he will come get her. I explained that the patient will be involuntarily committed for inpatient treatment for acute psychosis. He asked if the patient is danger to herself or others, I advised him that she is not suicidal or homicidal. However, she is acutely psychotic and requires treatment. He states, "you are holding her hostage and trying to take my money. You will be out of a job." No further questions asked.

## 2022-12-13 NOTE — Progress Notes (Signed)
Per [name], NP, patient meets criteria for inpatient treatment. There are no available beds at [facility] today. CSW faxed referrals to the following facilities for review:  [facility]  Pending - Request Sent N/A [address], [place] [address] [phone] --  [facility]  Pending - Request Sent N/A [address] [place] [address] [phone] --  [facility]  Pending - Request Sent N/A [address] [place] [address] [phone] --  [facility]  Pending - Request Sent N/A [address] [place] [address] [phone] --  [facility]  Pending - Request Sent N/A [address] [place] [address] [phone] --  [facility]  Pending - Request Sent N/A [address] [place], [place] [address] [phone] --  [facility]  Pending - Request Sent N/A [address] [place] [address] [phone] --  [facility]  Pending - Request Sent N/A [address], [place] [address] [phone] --  [facility]  Pending - Request Sent N/A [address] [place] [address] [phone] --  [facility]  Pending - Request Sent N/A [address] [place] [address] [phone] --  [facility]  Pending - Request Sent N/A [address], [place] [address] [phone] --  [facility]  Pending - Request Sent N/A [address] [place] [address] [phone] --  [facility]  Pending - Request Sent N/A [address], [place] [address] [phone] --  [facility]  Pending - Request Sent N/A [address] [place] [address] [phone] --  [facility]  Pending - Request Sent N/A [address], [place] [address] [phone] --  [facility]  Pending - Request Sent N/A [address] [place] [address] [phone] --   TTS will continue to seek bed placement.  Crissie Reese, MSW, Lenice Pressman Phone: [phone] Disposition/TOC

## 2022-12-13 NOTE — ED Notes (Signed)
Pt awake, alert, disorganized. States 'I am not sure why I am saying the things I did".  Denies SI/HI/AVH at present time. Resp even and unlabored. Will continue to monitor for safety

## 2022-12-13 NOTE — Progress Notes (Signed)
CSW spoke with [facility], [facility] admissions, patient is under review.   Crissie Reese, MSW, Lenice Pressman Phone: [phone] Disposition/TOC

## 2022-12-13 NOTE — ED Notes (Signed)
IVC returned from [employer] Phone call made to [facility] for patient to be served at this time.

## 2022-12-13 NOTE — ED Notes (Signed)
Pt demanding to see provider.. requesting dc. Provider notified.

## 2022-12-13 NOTE — ED Notes (Addendum)
Patient complaint of tongue swelling, and inability to swallow. Dr. Lucianne Muss was on the unit, we advised her of the patients complaint. She advised to place verbal order for 50 mg of Benadryl.

## 2022-12-13 NOTE — ED Notes (Signed)
Pt A&O x 4, sitting up eating at present, no distress noted. Calm & cooperative.  Monitoring for safety.

## 2022-12-13 NOTE — ED Notes (Signed)
Resting in recliner bed with eyes closed. Resp even and unlabored. Will continue to monitor for safety

## 2022-12-13 NOTE — ED Notes (Signed)
Pt resting quietly on recliner bed. Resp even and unlabored. Will continue to monitor for safety

## 2022-12-14 ENCOUNTER — Encounter (HOSPITAL_COMMUNITY): Payer: Self-pay | Admitting: Student

## 2022-12-14 DIAGNOSIS — F2081 Schizophreniform disorder: Secondary | ICD-10-CM | POA: Diagnosis not present

## 2022-12-14 LAB — POC URINE PREG, ED: Preg Test, Ur: NEGATIVE

## 2022-12-14 MED ORDER — TRAZODONE HCL 50 MG PO TABS
50.0000 mg | ORAL_TABLET | Freq: Every day | ORAL | Status: DC
  Filled 2022-12-14: qty 1

## 2022-12-14 MED ORDER — QUETIAPINE FUMARATE 50 MG PO TABS
50.0000 mg | ORAL_TABLET | Freq: Every day | ORAL | Status: DC
  Filled 2022-12-14: qty 1

## 2022-12-14 NOTE — ED Notes (Signed)
Rn spoke to [place] and she states patient is accepted to [facility].

## 2022-12-14 NOTE — ED Notes (Signed)
Rn spoke with [place] from [facility]

## 2022-12-14 NOTE — ED Notes (Signed)
Patient  resting in no acute stress. RR even and unlabored .Environment secured .Will continue to monitor for safely. 

## 2022-12-14 NOTE — Progress Notes (Signed)
Additional information was fax to [facility] per Intake's request. This is awaiting follow up. CSW will assist and follow. At this time pt prefers not to admit to [facility] if other placement options are available.    [name], MSW, LCSWA 12/14/2022 1:21 PM

## 2022-12-14 NOTE — Progress Notes (Signed)
Pt was accepted to  [facility] TODAY 12/14/22  Pt meets inpatient criteria per [name], NP  Attending Physician will be Joellyn Quails, MD   Phone number for report: [phone]  Arrival time: Bed is ready today  Care Team notified: Durwin Reges, RN, [name], NP, [facility] Edythe Clarity, RN   [place], LCSWA 12/14/2022 @ 3:16 PM

## 2022-12-14 NOTE — ED Notes (Signed)
Patient alert and oriented x 3. Denies SI/HI/AVH. Denies intent or plan to harm self or others. Routine conducted according to faculty protocol. Encourage patient to notify staff with any needs or concerns. Patient verbalized agreement and understanding. Will continue to monitor for safety. 

## 2022-12-14 NOTE — ED Notes (Addendum)
Patient observed/assessed on unit sitting in chair reading. Patient alert and oriented x 4. Affect is flat and eye contact is evasive. Patient denies pain and anxiety. He denies A/V/H. He denies having any thoughts/plan of self harm and harm towards others. Fluid and snack offered. Patient states that appetite has been good. Verbalizes no further complaints at this time. Will continue to monitor and support.

## 2022-12-14 NOTE — ED Notes (Signed)
Patient  sleeping in no acute stress. RR even and unlabored .Environment secured .Will continue to monitor for safely. 

## 2022-12-14 NOTE — ED Notes (Signed)
Pt was accepted to [facility] TODAY 12/14/22  Pt meets inpatient criteria per Peter Garter   Attending Physician will be Joellyn Quails, MD   Phone number for report: [phone]  Arrival time: Bed is ready today  Rn Notified [place]  that we were still waiting to hear from the sheriff for transport.

## 2022-12-14 NOTE — ED Notes (Signed)
Patient returned from being with provider

## 2022-12-14 NOTE — ED Notes (Signed)
Patient observed/assessed in room in bed appearing in no immediate distress resting peacefully. Q15 minute checks continued by MHT and nursing staff. Will continue to monitor and support. 

## 2022-12-14 NOTE — Progress Notes (Signed)
Pt was accepted to [facility] 12/15/22; Main [facility]  Pt meets inpatient criteria per [name], NP  Attending Physician will be Dr. Estill Cotta  Report can be called to: [phone] Pager number; must leave a phone number to receive a phone call back.  Pt can arrive after 9:00am   Durwin Reges, RN, [name], NP, [facility] Edythe Clarity, RN  [place], [place] 12/14/2022 @ 12:56 PM

## 2022-12-14 NOTE — Progress Notes (Signed)
CSW spoke with Brandy Hogan, Intake [facility] [phone] who advises that they are reviewing pt and requested updated behavioral health documentation and to speak with nursing. CSW coordinated intake speaking with assigned RN Durwin Reges, RN at [phone]. CSW also sent referral to out of network providers. Pt continues to meet inpatient criteria per [name], NP.  Destination  Service Provider Address Phone Fax  [facility]  [address] [place] [address] [phone]  [facility] [address] [place]  [address] [place] [address] [phone]  [facility]  [address] [place] [address] [phone]  [facility]  [address] [place] [address] [phone]  [facility]  [address] [place] [address] [phone]  [facility]  [address] [place], [place] [address] [phone]  [facility]  [address] [place] [address] [phone]  [facility]  [address] [place] [address] [phone]  [facility]  [address], [place] [address] [phone]  [facility]  [address], [place] [address] [phone]  [facility]  [address] [place] [address] [phone]  [facility]  [address] [place] [address] [phone]  [facility]  [address] [place] [address] [phone]  [facility]  [address] [place] [address] [phone]  [facility]  [address], [place] [address]  [phone]  [facility]  [address] [place], [place] [address] [phone]  [facility]  [address] [place] [address] [phone]  [facility]  [address] [place] [address] [phone]  [facility]  [address] [place] [address] [phone]  [facility]  [address] [place], [place] [address] [phone]  [facility]  [address] [place] [address] [phone]  [facility]  [address] [place] [address] [phone]    [name], MSW, [facility] 12/14/2022 12:43 PM

## 2022-12-14 NOTE — ED Notes (Signed)
Pt awake at present, no distress noted.  Monitoring for safety. 

## 2022-12-14 NOTE — ED Notes (Signed)
TODAY 12/14/22  Pt meets inpatient criteria per Peter Garter   Attending Physician will be Joellyn Quails, MD   Phone number for report: [phone]  Arrival time: Bed is ready today

## 2022-12-14 NOTE — ED Provider Notes (Cosign Needed Addendum)
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 Lucianne Muss. On evaluation, patient is alert and oriented to person, place and time. Her thought process is circumstantial with delusional and paranoid thought content. Her speech is coherent at a decreased tone. Her mood is dysphoric and affect is congruent. She is calm and cooperative. She denies SI/HI/AVH. There is no objective evidence that the patient is currently responding to internal or external stimuli. However, she continues to ruminate about past events of people tracking her and posting pictures of her last month. She talks about reparations that are owed to her and that the "committee" was tracking her down in the "[employer]" group that she is apart of for people who are owed money. She expresses that her deceased father has land from the government and that she is trying information on that. She states that since September she's been repeating/echoing what she heard in church or on t.v. She states that she would repeat everything that the preacher would say when she went to church. She inquires about inpatient options today. She states that she does not want to go to [facility] because she had a bad experience there last month. She is more insightful today about inpatient treatment and is agreeable to inpatient treatment at other facilities. She states that she does not like taking the two pills that she took yesterday, the Zyprexa and Invega. She is agreeable to restarting Seroquel as she feels like it helped her sleep the last time she was here last month. Will start Seroquel 50 mg po QHS.   Stay Summary: Per chart review,  Brandy Hogan is a 56 y.o. female, with PMH of psychosis,  inpatient psych admission, no suicide attempt who presented voluntary to [facility] (12/12/2022) with husband and daughter for psychosis x6-35mo.  Patient was initially seen in the assessment room with her hands over her ears, head bent over saying "stop". Patient was pleasant and cooperative on interview, however was guarded. Patient presented to [facility] because she "hasn't been sleeping well, having negative thoughts and needs a little brown pill with the number 25 on it". Patient adamantly denied that she is paranoid, exhibiting hallucinations, or psychotic. Patient stated that she has been feeling overwhelmed, for unclear reasons. She reported sleeping about 2-to 3 hours a night, poor months now. Stated that the [place] she received from [facility] was the only thing that helped her sleep. Patient was initially vague and perseverated on having negative thoughts, which later patient later described as fear of someone is after her to kill her, she mentioned that earlier this week she saw a commercial about organ donation, believed that it was a sinus and was then a steal her organs. Reported she often has to say "stop" for "focus" to calm her mind of the racing thoughts. She denied auditory or visual hallucinations, stated that what she hears is her own voice. She denied these thoughts/voices are encouraging her to hurt herself or other people. She denies these thoughts are telling her to do anything in particular. Reported that she feels safe at home despite intermittent fears that people are after her. She denied hearing that people are poisoning her, like last time she was admitted. Patient also reported symptoms  of ideas of reference, stated that she receives confirmation of her good deeds from God through the TV and radio. Stated that currently she is not able to hear God's voice or see visions from him over the past month, which disappoints her.   Total Time spent with patient: 20  minutes  Past Psychiatric History: History of psychosis.  Past Medical History:  Past Medical History:  Diagnosis Date   Asthma    History of admission to inpatient psychiatry department 12/12/2022   [facility] 10/2022   History reviewed. No pertinent surgical history. Family History: History reviewed. No pertinent family history.  Family Psychiatric History: No history reported.  Social History:  Social History   Substance and Sexual Activity  Alcohol Use None     Social History   Substance and Sexual Activity  Drug Use Not on file    Social History   Socioeconomic History   Marital status: Married    Spouse name: Not on file   Number of children: Not on file   Years of education: Not on file   Highest education level: Not on file  Occupational History   Not on file  Tobacco Use   Smoking status: Unknown   Smokeless tobacco: Not on file  Substance and Sexual Activity   Alcohol use: Not on file   Drug use: Not on file   Sexual activity: Not on file  Other Topics Concern   Not on file  Social History Narrative   Not on file   Social Determinants of Health   Financial Resource Strain: Not on file  Food Insecurity: Not on file  Transportation Needs: Not on file  Physical Activity: Not on file  Stress: Not on file  Social Connections: Not on file   SDOH:    Tobacco Cessation:  N/A, patient does not currently use tobacco products  Current Medications:  Current Facility-Administered Medications  Medication Dose Route Frequency Provider Last Rate Last Admin   acetaminophen (TYLENOL) tablet 650 mg  650 mg Oral Q6H PRN Princess Bruins, DO       albuterol (VENTOLIN HFA) 108 (90 Base) MCG/ACT inhaler 1-2 puff  1-2 puff Inhalation Q6H PRN Princess Bruins, DO       alum & mag hydroxide-simeth (MAALOX/MYLANTA) 200-200-20 MG/5ML suspension 30 mL  30 mL Oral Q4H PRN Princess Bruins, DO       benztropine (COGENTIN) tablet 1 mg  1 mg Oral BID PRN Mckala Pantaleon L, NP        calamine lotion 1 Application  1 Application Topical PRN Princess Bruins, DO       cyanocobalamin (VITAMIN B12) tablet 1,000 mcg  1,000 mcg Oral Daily Princess Bruins, DO   1,000 mcg at 12/14/22 1006   diphenhydrAMINE (BENADRYL) capsule 50 mg  50 mg Oral Once PRN [name] L, NP       EPINEPHrine (EPI-PEN) injection 0.3 mg  0.3 mg Intramuscular PRN Princess Bruins, DO       haloperidol (HALDOL) tablet 5 mg  5 mg Oral BID PRN Latorsha Curling L, NP       Or   haloperidol lactate (HALDOL) injection 5 mg  5 mg Intramuscular BID PRN Chaunce Winkels L, NP       hydrOXYzine (ATARAX) tablet 25 mg  25 mg Oral TID PRN Princess Bruins, DO   25 mg at 12/12/22 2155   loratadine (CLARITIN) tablet 10 mg  10 mg Oral QHS Princess Bruins, DO   10 mg at 12/12/22  2200   magnesium hydroxide (MILK OF MAGNESIA) suspension 30 mL  30 mL Oral Daily PRN Princess Bruins, DO       multivitamin with minerals tablet 1 tablet  1 tablet Oral Daily Princess Bruins, DO   1 tablet at 12/14/22 1006   QUEtiapine (SEROQUEL) tablet 50 mg  50 mg Oral QHS Samiha Denapoli L, NP       traZODone (DESYREL) tablet 50 mg  50 mg Oral QHS Naturi Alarid L, NP       Zyaire Dumas petrolatum (VASELINE) gel   Topical PRN Princess Bruins, DO       Current Outpatient Medications  Medication Sig Dispense Refill   calcium carbonate (OSCAL) 1500 (600 Ca) MG TABS tablet Take 600 mg of elemental calcium by mouth daily.     albuterol (VENTOLIN HFA) 108 (90 Base) MCG/ACT inhaler Inhale into the lungs every 6 (six) hours as needed for wheezing or shortness of breath.     cyanocobalamin 1000 MCG tablet Take 1,000 mcg by mouth daily.     EPINEPHrine 0.3 mg/0.3 mL IJ SOAJ injection Inject 0.3 mg into the muscle as needed for anaphylaxis.     Multiple Vitamin (MULTIVITAMIN WITH MINERALS) TABS tablet Take 1 tablet by mouth daily.      PTA Medications: (Not in a hospital admission)       No data to display          Flowsheet Row ED from 12/12/2022 in [facility] ED from [facility]07/2022 in [facility] ED from [facility]28/2022 in [facility]GENCY [facility]  C-SSRS RISK CATEGORY No Risk No Risk No Risk       Musculoskeletal  Strength & Muscle Tone: within normal limits Gait & Station: normal Patient leans: N/A  Psychiatric Specialty Exam  Presentation  General Appearance:  Appropriate for Environment  Eye Contact: Fair  Speech: Clear and Coherent  Speech Volume: Decreased  Handedness: Right   Mood and Affect  Mood: Dysphoric  Affect: Congruent   Thought Process  Thought Processes: Coherent  Descriptions of Associations:Circumstantial  Orientation:Full (Time, Place and Person)  Thought Content:Paranoid Ideation  Diagnosis of Schizophrenia or Schizoaffective disorder in past: No    Hallucinations:Hallucinations: None  Ideas of Reference:Paranoia; Delusions  Suicidal Thoughts:Suicidal Thoughts: No  Homicidal Thoughts:Homicidal Thoughts: No   Sensorium  Memory: Immediate Fair; Remote Fair; Recent Fair  Judgment: Intact  Insight: Present   Executive Functions  Concentration: Fair  Attention Span: Fair  Recall: [employer] of Knowledge: Fair  Language: Fair   Psychomotor Activity  Psychomotor Activity: Psychomotor Activity: Normal   Assets  Assets: Communication Skills; Desire for Improvement; Financial Resources/Insurance; Housing; Leisure Time; Physical Health   Sleep  Sleep: Sleep: Fair   No data recorded  Physical Exam  Physical Exam HENT:     Head: Normocephalic.     Nose: Nose normal.  Eyes:     Conjunctiva/sclera: Conjunctivae normal.  Cardiovascular:     Rate and Rhythm: Normal rate.  Pulmonary:     Effort: Pulmonary effort is normal.  Musculoskeletal:        General: Normal range of motion.     Cervical back: Normal range of motion.  Neurological:     Mental Status: She is alert and oriented to  person, place, and time.    Review of Systems  Constitutional: Negative.   HENT: Negative.    Eyes: Negative.   Respiratory: Negative.    Cardiovascular: Negative.  Gastrointestinal: Negative.   Genitourinary: Negative.   Musculoskeletal: Negative.   Neurological: Negative.   Endo/Heme/Allergies: Negative.    Blood pressure 111/78, pulse 90, temperature 98.3 F (36.8 C), temperature source Oral, resp. rate 18, last menstrual period 09/08/2012, SpO2 100 %. There is no height or weight on file to calculate BMI.  Disposition: Patient recommended for inpatient psych treatment. Patient under IVC. D/c'd invega and zyprexa.  [facility] 12/14/22  Pt meets inpatient criteria per Peter GarterEric Bishopmd   Attending Physician will be Joellyn QuailsEric Bishop, MD   Phone number for report: [phone]  Arrival time: Bed is ready today    Layla BarterWhite, Marthena Whitmyer L, NP 12/14/2022, 3:27 PM

## 2022-12-14 NOTE — ED Notes (Signed)
Patient stated that she would take her QHS medications that were scheduled when initially talking to her. Each medication was explained to patient and she verbalized understanding prior to medications being drawn up for administration. Upon administering medications to guest, she poured them into her hand and then refused to take them. She then requested that she receive her PRN vistaril. Patient refused Claritan 10 mg PO, Seroquel 50 mg PO, and Trazodone 50 mg PO. NP Notified. Will continue to monitor.

## 2022-12-14 NOTE — Progress Notes (Signed)
CSW spoke with [facility] Intake who reports that there is no documentation on their end that pt was fully accepted. [facility] advised to send referral again for further review.  Care Team notified: [facility] Sharyne Peach, RN, Crissie Figures, RN, and [name], NP   [name], MSW, [facility] 12/14/2022 10:38 AM

## 2022-12-14 NOTE — ED Notes (Signed)
Patient is with provider

## 2022-12-14 NOTE — ED Notes (Signed)
AC called sheriff to transport patient to [facility]. Waiting on [facility] to call back.

## 2022-12-14 NOTE — Discharge Instructions (Addendum)
Transfer to [facility]

## 2022-12-15 ENCOUNTER — Encounter (HOSPITAL_COMMUNITY): Payer: Self-pay | Admitting: Student

## 2022-12-15 ENCOUNTER — Inpatient Hospital Stay (HOSPITAL_COMMUNITY)
Admission: AD | Admit: 2022-12-15 | Discharge: 2022-12-21 | DRG: 885 | Disposition: A | Discharge status: Home / Self Care | Payer: No Typology Code available for payment source | Source: Intra-hospital | Attending: Psychiatry | Admitting: Psychiatry

## 2022-12-15 ENCOUNTER — Other Ambulatory Visit: Payer: Self-pay

## 2022-12-15 DIAGNOSIS — F203 Undifferentiated schizophrenia: Principal | ICD-10-CM | POA: Insufficient documentation

## 2022-12-15 DIAGNOSIS — G4700 Insomnia, unspecified: Secondary | ICD-10-CM | POA: Diagnosis present

## 2022-12-15 DIAGNOSIS — L309 Dermatitis, unspecified: Secondary | ICD-10-CM | POA: Diagnosis present

## 2022-12-15 DIAGNOSIS — J45909 Unspecified asthma, uncomplicated: Secondary | ICD-10-CM | POA: Diagnosis present

## 2022-12-15 DIAGNOSIS — Z91013 Allergy to seafood: Secondary | ICD-10-CM | POA: Diagnosis not present

## 2022-12-15 DIAGNOSIS — Z1152 Encounter for screening for COVID-19: Secondary | ICD-10-CM | POA: Diagnosis not present

## 2022-12-15 DIAGNOSIS — Z23 Encounter for immunization: Secondary | ICD-10-CM

## 2022-12-15 DIAGNOSIS — Z79899 Other long term (current) drug therapy: Secondary | ICD-10-CM

## 2022-12-15 DIAGNOSIS — Z818 Family history of other mental and behavioral disorders: Secondary | ICD-10-CM | POA: Diagnosis not present

## 2022-12-15 DIAGNOSIS — F419 Anxiety disorder, unspecified: Secondary | ICD-10-CM | POA: Diagnosis present

## 2022-12-15 DIAGNOSIS — F2081 Schizophreniform disorder: Secondary | ICD-10-CM | POA: Diagnosis not present

## 2022-12-15 DIAGNOSIS — Z91018 Allergy to other foods: Secondary | ICD-10-CM | POA: Diagnosis not present

## 2022-12-15 DIAGNOSIS — Z91012 Allergy to eggs: Secondary | ICD-10-CM | POA: Diagnosis not present

## 2022-12-15 DIAGNOSIS — Z888 Allergy status to other drugs, medicaments and biological substances status: Secondary | ICD-10-CM | POA: Diagnosis not present

## 2022-12-15 MED ORDER — EPINEPHRINE 0.3 MG/0.3ML IJ SOAJ: 0.3000 mg | INTRAMUSCULAR | Status: DC | PRN

## 2022-12-15 MED ORDER — ACETAMINOPHEN 325 MG PO TABS: 650.0000 mg | ORAL_TABLET | Freq: Four times a day (QID) | ORAL | Status: DC | PRN

## 2022-12-15 MED ORDER — ALUM & MAG HYDROXIDE-SIMETH 200-200-20 MG/5ML PO SUSP: 30.0000 mL | ORAL | Status: DC | PRN

## 2022-12-15 MED ORDER — INFLUENZA VAC SUBUNIT QUAD 0.5 ML IM SUSY
0.5000 mL | PREFILLED_SYRINGE | INTRAMUSCULAR | Status: DC
  Filled 2022-12-15: qty 0.5

## 2022-12-15 MED ORDER — ALBUTEROL SULFATE HFA 108 (90 BASE) MCG/ACT IN AERS: 1.0000 | INHALATION_SPRAY | Freq: Four times a day (QID) | RESPIRATORY_TRACT | Status: DC | PRN

## 2022-12-15 MED ORDER — MAGNESIUM HYDROXIDE 400 MG/5ML PO SUSP
30.0000 mL | Freq: Every day | ORAL | Status: DC | PRN
  Administered 2022-12-16: 30 mL via ORAL
  Filled 2022-12-15: qty 30

## 2022-12-15 MED ORDER — HYDROXYZINE HCL 25 MG PO TABS
25.0000 mg | ORAL_TABLET | Freq: Three times a day (TID) | ORAL | Status: DC | PRN
  Administered 2022-12-15 – 2022-12-16 (×2): 25 mg via ORAL
  Filled 2022-12-15 (×2): qty 1

## 2022-12-15 MED ORDER — TRAZODONE HCL 50 MG PO TABS: 50.0000 mg | ORAL_TABLET | Freq: Every evening | ORAL | Status: DC | PRN

## 2022-12-15 MED ORDER — HYDROCERIN EX CREA
TOPICAL_CREAM | Freq: Two times a day (BID) | CUTANEOUS | Status: DC
  Administered 2022-12-16 – 2022-12-21 (×9): 1 via TOPICAL
  Filled 2022-12-15 (×2): qty 113

## 2022-12-15 MED ORDER — LORATADINE 10 MG PO TABS: 10.0000 mg | ORAL_TABLET | Freq: Every day | ORAL | Status: DC | PRN

## 2022-12-15 NOTE — ED Provider Notes (Addendum)
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 However transfer was stalled due to [place] with plan to transfer patient to [facility] on 12/16/2021.  However, patient was accepted to [place] on 12/15/2021 and will be transferred to [facility].   Subjective: Patient seen and evaluated face-to-face by this provider, chart reviewed and case discussed with Dr. Dwyane Dee. On evaluation, patient is alert and oriented to person, place and time. Her thought process is circumstantial with delusional and paranoid thought content. Her speech is coherent at a decreased tone. Her mood is dysphoric and affect is congruent. She is calm and cooperative. She denies SI/HI/AVH. There is no objective evidence that the patient is currently responding to internal or external stimuli. However, she continues to ruminate about past events of people tracking her and posting pictures of her last month. She talks about reparations that are owed to her and that the "committee" was tracking her down in the "[name]" group that she is apart of for people who are owed money. She expresses that her deceased father has land from the government and that she is trying information on that. She states that since September she's been repeating/echoing what she heard in church or on t.v. She states that she would repeat everything that the preacher would say when she went to church. She inquires about inpatient options today. She states that she does not want to go to [facility] because she had a bad experience there last month. She is more insightful today about inpatient treatment and is agreeable to inpatient treatment at other facilities. She states that she does not like taking the  two pills that she took yesterday, the Zyprexa and Invega. She is agreeable to restarting Seroquel as she feels like it helped her sleep the last time she was here last month. Will start Seroquel 50 mg po QHS.   Stay Summary: Per H&P: Brandy Hogan is a 57 y.o. female, with PMH of psychosis, inpatient psych admission, no suicide attempt who presented voluntary to [facility] (12/12/2022) with husband and daughter for psychosis x6-33mo.  Patient was initially seen in the assessment room with her hands over her ears, head bent over saying "stop". Patient was pleasant and cooperative on interview, however was guarded. Patient presented to [facility] because she "hasn't been sleeping well, having negative thoughts and needs a little brown pill with the number 25 on it". Patient adamantly denied that she is paranoid, exhibiting hallucinations, or psychotic. Patient stated that she has been feeling overwhelmed, for unclear reasons. She reported sleeping about 2-to 3 hours a night, poor months now. Stated that the [place] she received from [facility] was the only thing that helped her sleep. Patient was initially vague and perseverated on having negative thoughts, which later patient later described as fear of someone is after her to kill her, she mentioned that earlier this week she saw a commercial about organ donation, believed that it was a sinus and was then a steal her organs. Reported she often has to say "stop" for "focus" to calm her mind of the racing thoughts. She denied auditory or visual hallucinations, stated that what she hears is her own voice. She denied these thoughts/voices are encouraging her to hurt  herself or other people. She denies these thoughts are telling her to do anything in particular. Reported that she feels safe at home despite intermittent fears that people are after her. She denied hearing that people are poisoning her, like last time she was admitted. Patient also  reported symptoms of ideas of reference, stated that she receives confirmation of her good deeds from God through the TV and radio. Stated that currently she is not able to hear God's voice or see visions from him over the past month, which disappoints her.   Total Time spent with patient: 20 minutes  Past Psychiatric History:  Dx: "psychosis" no formal dx Rx: denied. Was started on invega at [facility] during last presentation Outpatient psychiatrist: Denied Outpatient therapist: Yes Inpatient psych admission: Yes 10/2022 presented to [facility], started on invega and transferred to [place] for psychosis. Suicide attempts: Denied   Family Psychiatric History: Denied    Social History: Living: with Husband and daughter Income: Unemployed currently Social support: Husband   Seizure/DT: Dennied  IVDU: Denied EtOH:  has no history on file for alcohol use.  Tobacco:  has no history on file for tobacco use.  Cannabis: Denied Opiates: Denied Stimulants: Denied BZO/hypnotics: Denied Past Medical History:  Past Medical History:  Diagnosis Date   Asthma    History of admission to inpatient psychiatry department 12/12/2022   [facility] 10/2022   History reviewed. No pertinent surgical history. Family History: History reviewed. No pertinent family history.  Family Psychiatric History: No history reported.  Social History:  Social History   Substance and Sexual Activity  Alcohol Use None     Social History   Substance and Sexual Activity  Drug Use Not on file    Social History   Socioeconomic History   Marital status: Married    Spouse name: Not on file   Number of children: Not on file   Years of education: Not on file   Highest education level: Not on file  Occupational History   Not on file  Tobacco Use   Smoking status: Unknown   Smokeless tobacco: Not on file  Substance and Sexual Activity   Alcohol use: Not on file   Drug use: Not on file   Sexual activity:  Not on file  Other Topics Concern   Not on file  Social History Narrative   Not on file   Social Determinants of Health   Financial Resource Strain: Not on file  Food Insecurity: Not on file  Transportation Needs: Not on file  Physical Activity: Not on file  Stress: Not on file  Social Connections: Not on file   SDOH:    Tobacco Cessation:  N/A, patient does not currently use tobacco products  Current Medications:  Current Facility-Administered Medications  Medication Dose Route Frequency Provider Last Rate Last Admin   acetaminophen (TYLENOL) tablet 650 mg  650 mg Oral Q6H PRN Merrily Brittle, DO       albuterol (VENTOLIN HFA) 108 (90 Base) MCG/ACT inhaler 1-2 puff  1-2 puff Inhalation Q6H PRN Merrily Brittle, DO       alum & mag hydroxide-simeth (MAALOX/MYLANTA) 200-200-20 MG/5ML suspension 30 mL  30 mL Oral Q4H PRN Merrily Brittle, DO       benztropine (COGENTIN) tablet 1 mg  1 mg Oral BID PRN White, Patrice L, NP       calamine lotion 1 Application  1 Application Topical PRN Merrily Brittle, DO       cyanocobalamin (VITAMIN  B12) tablet 1,000 mcg  1,000 mcg Oral Daily Princess Bruins, DO   1,000 mcg at 12/15/22 7169   diphenhydrAMINE (BENADRYL) capsule 50 mg  50 mg Oral Once PRN [name] L, NP       EPINEPHrine (EPI-PEN) injection 0.3 mg  0.3 mg Intramuscular PRN Princess Bruins, DO       hydrOXYzine (ATARAX) tablet 25 mg  25 mg Oral TID PRN Princess Bruins, DO   25 mg at 12/14/22 2115   loratadine (CLARITIN) tablet 10 mg  10 mg Oral Daily PRN Princess Bruins, DO       magnesium hydroxide (MILK OF MAGNESIA) suspension 30 mL  30 mL Oral Daily PRN Princess Bruins, DO       QUEtiapine (SEROQUEL) tablet 50 mg  50 mg Oral QHS White, Patrice L, NP       traZODone (DESYREL) tablet 50 mg  50 mg Oral QHS PRN Princess Bruins, DO       white petrolatum (VASELINE) gel   Topical PRN Princess Bruins, DO       Current Outpatient Medications  Medication Sig Dispense Refill   calcium carbonate (OSCAL) 1500  (600 Ca) MG TABS tablet Take 600 mg of elemental calcium by mouth daily.     albuterol (VENTOLIN HFA) 108 (90 Base) MCG/ACT inhaler Inhale into the lungs every 6 (six) hours as needed for wheezing or shortness of breath.     cyanocobalamin 1000 MCG tablet Take 1,000 mcg by mouth daily.     EPINEPHrine 0.3 mg/0.3 mL IJ SOAJ injection Inject 0.3 mg into the muscle as needed for anaphylaxis.     Multiple Vitamin (MULTIVITAMIN WITH MINERALS) TABS tablet Take 1 tablet by mouth daily.      PTA Medications: (Not in a hospital admission)       No data to display           Flowsheet Row ED from 12/12/2022 in [facility] from 11/07/2022 in [facility] ED from 10/28/2022 in [facility] EMERGENCY DEPARTMENT  C-SSRS RISK CATEGORY No Risk No Risk No Risk       Musculoskeletal  Strength & Muscle Tone: within normal limits Gait & Station: normal Patient leans: N/A  Psychiatric Specialty Exam  Presentation  General Appearance:  Appropriate for Environment  Eye Contact: Fair  Speech: Clear and Coherent  Speech Volume: Decreased  Handedness: Right   Mood and Affect  Mood: Dysphoric  Affect: Congruent   Thought Process  Thought Processes: Coherent  Descriptions of Associations:Circumstantial  Orientation:Full (Time, Place and Person)  Thought Content:Paranoid Ideation  Diagnosis of Schizophrenia or Schizoaffective disorder in past: No   Hallucinations:Hallucinations: None  Ideas of Reference:Paranoia; Delusions  Suicidal Thoughts:Suicidal Thoughts: No  Homicidal Thoughts:Homicidal Thoughts: No   Sensorium  Memory: Immediate Fair; Remote Fair; Recent Fair  Judgment: Intact  Insight: Present  Executive Functions  Concentration: Fair  Attention Span: Fair  Recall: [employer] of Knowledge: Fair  Language: Fair  Psychomotor Activity  Psychomotor  Activity: Psychomotor Activity: Normal  Assets  Assets: Communication Skills; Desire for Improvement; Financial Resources/Insurance; Housing; Leisure Time; Physical Health  Sleep  Sleep: Sleep: Fair  Physical Exam  Physical Exam HENT:     Head: Normocephalic.     Nose: Nose normal.  Eyes:     Conjunctiva/sclera: Conjunctivae normal.  Cardiovascular:     Rate and Rhythm: Normal rate.  Pulmonary:     Effort: Pulmonary effort is normal.  Musculoskeletal:        General: Normal range of motion.     Cervical back: Normal range of motion.  Neurological:     Mental Status: She is alert and oriented to person, place, and time.    Review of Systems  Constitutional: Negative.   HENT: Negative.    Eyes: Negative.   Respiratory: Negative.    Cardiovascular: Negative.   Gastrointestinal: Negative.   Genitourinary: Negative.   Musculoskeletal: Negative.   Neurological: Negative.   Endo/Heme/Allergies: Negative.    Blood pressure 103/75, pulse 87, temperature 98.4 F (36.9 C), temperature source Oral, resp. rate 16, last menstrual period 09/08/2012, SpO2 100 %. There is no height or weight on file to calculate BMI.  Disposition: Patient recommended for inpatient psych treatment. Patient under IVC.  [facility] - [address]  Princess Bruins, DO 12/15/2022, 11:46 AM

## 2022-12-15 NOTE — Progress Notes (Signed)
Admission Note:  Patient is a 52yr female who presents IVC in no acute distress for the treatment of schizophrenia, psychosis and depression. Pt appears flat and depressed. Pt was calm and cooperative with admission process. Pt contracts for safety upon admission. Pt denies AVH actively, but stated that she has in the past.   Patient stated her husband brought her to the [facility] for an assessment for " over thinking"  and having worsening negative thoughts. When asked what was her trigger, she stated " things going on in my life". In addition, patient stated she was sad that she hasn't seen her grand kids since 10/23 due to having a disagreement with her son's girlfriend. Patient stated that she was hospitalized at [facility] in 11/23.   Skin was assessed and found to be clear of any abnormal marks apart from eczema on her face and neck area. PT searched and no contraband found, POC and unit policies explained and understanding verbalized. Consents obtained. Food and fluids offered, and fluids accepted. Pt had no additional questions or concerns.

## 2022-12-15 NOTE — ED Notes (Signed)
Patient observed/assessed in bed/chair resting quietly appearing in no distress and verbalizing no complaints at this time. Will continue to monitor.  

## 2022-12-15 NOTE — Progress Notes (Signed)
CSW updated that pt transferred to [facility]. Accepting Provider: Dr. Caswell Corwin. CSW updated [facility] that pt no longer needs bed due to pt transferring to [facility].  Transfer was already completed to [facility] and nursing facilitated.   Benjaman Kindler, MSW, [facility] 12/15/2022 6:13 PM

## 2022-12-15 NOTE — Progress Notes (Signed)
Report was given to nurse Clarise Cruz at [facility].

## 2022-12-15 NOTE — Progress Notes (Signed)
D- Patient alert and oriented.  Denies SI, HI, AVH, and pain. Patient endorses racing thoughts that are causing her to become "overwhelmed." Patient states, "I'm not depressed, I'm just worried and fearful." Patient rates anxiety as 6/10. Patient states that she is wanting to take a med that she described as, "It's a brown pill with a 25 written on it. It worked the best." Patient complaining of irritation from eczema on arms and neck.  A- PRN hydroxyzine 25mg  administered to patient for anxiety, per MAR. Provider notified about eczema irritation. Support and encouragement provided.  Routine safety checks conducted every 15 minutes.  Patient informed to notify staff with problems or concerns.  R- No adverse drug reactions noted. Patient contracts for safety at this time. Patient compliant with medications and treatment plan. Patient receptive, calm, and cooperative. Patient interacts well with others on the unit.  Patient remains safe at this time.

## 2022-12-15 NOTE — Progress Notes (Signed)
Received Brandy Hogan this AM asleep in her chair bed, she was awaken for breakfast and medications. She  denied all of the psychiatric symptoms this AM including feeling suicidal.

## 2022-12-15 NOTE — Progress Notes (Signed)
Brandy Hogan was transferred via [facility] to [facility] with her personal belongings.

## 2022-12-15 NOTE — Progress Notes (Signed)
Adult Psychoeducational Group Note  Date:  12/15/2022 Time:  8:45 PM  Group Topic/Focus:  Wrap-Up Group:   The focus of this group is to help patients review their daily goal of treatment and discuss progress on daily workbooks.  Participation Level:  Minimal  Participation Quality:  Appropriate  Affect:  Appropriate  Cognitive:  Appropriate  Insight: Appropriate  Engagement in Group:  Engaged  Modes of Intervention:  Education and Exploration  Additional Comments:  Patient attended and participated in group tonight. She reports that she was glad to be here. Glad she wasn't sent somewhere else too far away where it would be hard for family to visit with her.  Salley [facility]let [facility] 12/15/2022, 8:45 PM

## 2022-12-15 NOTE — Progress Notes (Signed)
Brandy Hogan continues to rest in her chair bed throughout the morning. She spoke with the providers earlier. No distress noted.

## 2022-12-15 NOTE — Plan of Care (Signed)
  Problem: Education: Goal: Emotional status will improve Outcome: Not Progressing Goal: Mental status will improve Outcome: Not Progressing   Problem: Activity: Goal: Sleeping patterns will improve Outcome: Not Progressing   Problem: Health Behavior/Discharge Planning: Goal: Identification of resources available to assist in meeting health care needs will improve Outcome: Not Progressing

## 2022-12-16 DIAGNOSIS — F2081 Schizophreniform disorder: Secondary | ICD-10-CM | POA: Diagnosis not present

## 2022-12-16 MED ORDER — OLANZAPINE 5 MG PO TBDP: 5.0000 mg | ORAL_TABLET | Freq: Three times a day (TID) | ORAL | Status: DC | PRN

## 2022-12-16 MED ORDER — ZIPRASIDONE MESYLATE 20 MG IM SOLR: 20.0000 mg | INTRAMUSCULAR | Status: DC | PRN

## 2022-12-16 MED ORDER — LORATADINE 10 MG PO TABS
10.0000 mg | ORAL_TABLET | Freq: Once | ORAL | Status: DC
  Filled 2022-12-16: qty 1

## 2022-12-16 MED ORDER — QUETIAPINE FUMARATE 50 MG PO TABS
50.0000 mg | ORAL_TABLET | Freq: Every day | ORAL | Status: DC
  Filled 2022-12-16 (×4): qty 1

## 2022-12-16 MED ORDER — LORAZEPAM 1 MG PO TABS: 1.0000 mg | ORAL_TABLET | ORAL | Status: DC | PRN

## 2022-12-16 NOTE — Plan of Care (Signed)
  Problem: Activity: Goal: Interest or engagement in activities will improve Outcome: Progressing   Problem: Coping: Goal: Ability to verbalize frustrations and anger appropriately will improve Outcome: Progressing   Problem: Safety: Goal: Ability to redirect hostility and anger into socially appropriate behaviors will improve Outcome: Progressing   Problem: Safety: Goal: Ability to remain free from injury will improve Outcome: Progressing   Problem: Self-Concept: Goal: Level of anxiety will decrease Outcome: Progressing

## 2022-12-16 NOTE — Progress Notes (Signed)
Recreation Therapy Notes  INPATIENT RECREATION THERAPY ASSESSMENT  Patient Details Name: Sonji Starkes MRN: [id number] DOB: 28-Oct-1966 Today's Date: 12/16/2022       Information Obtained From: Patient  Able to Participate in Assessment/Interview: Yes  Patient Presentation: Alert  Reason for Admission (Per Patient): Other (Comments) ("I was trying to get some meds from [address]")  Patient Stressors: Other (Comment) ("being separated from family.  I haven't seen my grandsons in a while")  Coping Skills:   Isolation, Journal, TV, Music, Deep Breathing, Talk, Prayer, Avoidance, Read  Leisure Interests (2+):  Crafts - Other (Comment), Individual - Other (Comment) (try to learn new skills, upolstry classess)  Frequency of Recreation/Participation: Other (Comment) (Upolstry classess weekly/ended in October; Crafting not lately)  Awareness of Community Resources:  Yes  Community Resources:  [place]  Current Use: Yes  If no, Barriers?:    Expressed Interest in [place]: No  [employer] of Residence:  Investment banker, corporate  Patient Main Form of Transportation: Musician  Patient Strengths:  Eye for detail, don't know how to give up on things, problem solving  Patient Identified Areas of Improvement:  Meeting people where they are, try to understand why people do the things they do and being more supportive of the things my kids want to do,  Patient Goal for Hospitalization:  "work on exit plan and get more sleep"  Current SI (including self-harm):  No  Current HI:  No  Current AVH: No  Staff Intervention Plan: Group Attendance, Collaborate with Interdisciplinary Treatment Team  Consent to Intern Participation: N/A  Dontre Laduca-McCall, LRT,CTRS Maevyn Riordan A Laverda Stribling-McCall 12/16/2022, 1:52 PM
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
## 2022-12-16 NOTE — Progress Notes (Signed)
The focus of this group is to help patients review their daily goal of treatment and discuss progress on daily workbooks.  Pt attended the evening group and responded to all discussion prompts from the [place]. Pt shared that today was an "okay" day on the unit, the highlight of which was going to the gym for physical activity. "I missed more baskets than I made, but it was fun."  Pt told that her current goal was to remember the name of a medication that she previously took to help with her thoughts.  Pt rated her day a 4 out of 10 and her affect was appropriate.
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
## 2022-12-16 NOTE — Group Note (Signed)
Recreation Therapy Group Note   Group Topic:Goal Setting  Group Date: 12/16/2022 Start Time: 1005 End Time: 1035 Facilitators: Danaye Sobh-McCall, LRT,CTRS Location: [address] Dayroom   Goal Area(s) Addresses:  Patient will identify what goals to work towards. Patient will identify ways to reach said goals.  Group Description:  New Year Goals.  LRT and patients discussed the importance of having a plan in the new year.  LRT and patients discussed how the plan can be used as a guide and reminder to the patients whenever they fall off track.  Patients were then given a worksheet.  One the worksheet, patients had to identify their top 5 priorities in the new year.  Patients would then identify 5 goals  and the action steps they need to take in order to accomplish the goals identified.     Affect/Mood: N/A   Participation Level: Did not attend    Clinical Observations/Individualized Feedback:     Plan: Continue to engage patient in RT group sessions 2-3x/week.   Kaja Jackowski-McCall, LRT,CTRS 12/16/2022 1:29 PM

## 2022-12-16 NOTE — Progress Notes (Addendum)
Pt forwards little information this evening , pt requesting Claritin , pt educated on Vistaril , but but wants Claritin. NP-Roy notified, pt encouraged to talk to the doctor.     12/16/22 2000  Psych Admission Type (Psych Patients Only)  Admission Status Involuntary  Psychosocial Assessment  Patient Complaints Anxiety;Suspiciousness  Eye Contact Fair  Facial Expression Anxious;Worried  Affect Anxious  Catering manager Activity Slow  Appearance/Hygiene Unremarkable  Behavior Characteristics Cooperative;Guarded  Mood Anxious  Aggressive Behavior  Effect No apparent injury  Thought Process  Coherency Blocking  Content WDL  Delusions WDL  Perception WDL  Hallucination None reported or observed  Judgment Limited  Confusion None  Danger to Self  Current suicidal ideation? Denies  Danger to Others  Danger to Others None reported or observed

## 2022-12-16 NOTE — Progress Notes (Addendum)
Pt very paranoid about medications to Probation officer. Writer informed pt that she agreed to take the Seroquel to the doctor. Pt stated she never talked to the Doctor about taking  Seroquel . Pt continues to be paranoid and confrontational about the medications with this Probation officer. "Let's Google the medication" , writer explained the class of medication and how it will help her mood and her thoughts, pt continues to be suspicious of Probation officer. Pt encouraged to talk to the doctor.
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
## 2022-12-16 NOTE — Progress Notes (Signed)
   12/16/22 0548  15 Minute Checks  Location Bedroom  Visual Appearance Calm  Behavior Sleeping  Sleep (Behavioral Health Patients Only)  Calculate sleep? (Click Yes once per 24 hr at 0600 safety check) Yes  Documented sleep last 24 hours 6.5
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
## 2022-12-16 NOTE — Progress Notes (Signed)
   12/16/22 1000  Psych Admission Type (Psych Patients Only)  Admission Status Involuntary  Psychosocial Assessment  Patient Complaints Worrying;Depression  Eye Contact Fair  Facial Expression Anxious;Worried  Affect Anxious;Appropriate to circumstance  Speech Logical/coherent  Interaction Assertive  Motor Activity Other (Comment) (WNL)  Appearance/Hygiene Unremarkable  Behavior Characteristics Cooperative;Calm  Mood Depressed;Anxious  Thought Process  Coherency WDL  Content WDL  Delusions None reported or observed  Perception WDL  Hallucination None reported or observed  Judgment Limited  Confusion None  Danger to Self  Current suicidal ideation? Denies  Self-Injurious Behavior No self-injurious ideation or behavior indicators observed or expressed   Agreement Not to Harm Self Yes  Description of Agreement verbally contracts for safety  Danger to Others  Danger to Others None reported or observed

## 2022-12-17 ENCOUNTER — Encounter (HOSPITAL_COMMUNITY): Payer: Self-pay

## 2022-12-17 DIAGNOSIS — F203 Undifferentiated schizophrenia: Secondary | ICD-10-CM | POA: Diagnosis not present

## 2022-12-17 MED ORDER — MIRTAZAPINE 15 MG PO TABS
15.0000 mg | ORAL_TABLET | Freq: Every day | ORAL | Status: DC
  Administered 2022-12-17 – 2022-12-20 (×4): 15 mg via ORAL
  Filled 2022-12-17 (×8): qty 1

## 2022-12-17 MED ORDER — DIPHENHYDRAMINE HCL 25 MG PO CAPS: 25.0000 mg | ORAL_CAPSULE | Freq: Three times a day (TID) | ORAL | Status: DC | PRN

## 2022-12-17 MED ORDER — RISPERIDONE 0.5 MG PO TABS
0.5000 mg | ORAL_TABLET | Freq: Two times a day (BID) | ORAL | Status: DC
  Administered 2022-12-17: 0.5 mg via ORAL
  Filled 2022-12-17 (×5): qty 1

## 2022-12-17 NOTE — Progress Notes (Signed)
[facility] MD Progress Note  12/17/2022 4:45 PM Brandy Hogan  MRN:  [id number]  Reason for admission: 57 yr old female who presented on 12/30 to [facility] for issues with sleep, she was placed under IVC for delusions and paranoia, she was admitted to [facility] on 1/2. PPHx is significant for Psychosis and 1 Previous Psychiatric Hospitalization [facility] 10/2022), and no history of Suicide Attempts or self Injurious Behavior.   Daily notes: Brandy Hogan is seen in her room. Chart reviewed. The chart findings discussed with the treatment team. She presents alert, oriented to self & place. She seem a bit paranoid & aware of it. She is making a fair eye contact & verbally responsive. She reports, "I came to the hospital because I was having difficulty sleeping. This started last September. I was also feeling like I was being followed. I did not have this problem until this past September. It has to do with money/inheritance from being a descendant of [place] or it's root. I'm kind of feeling overwhelmed. I'm trying to be positive or have a positive affirmation. I'm a little depressed, trying to gain control of my negative thoughts. Not being able to control repeating things other people said is challenging for me. I need to work on those. I'm a little shaky today".  During this evaluation, Brandy Hogan presents tangential & disorganized. She continues to present with paranoid ideations & delusional thinking. Besides her mental illness,  she presents with a severe dry/scaly skin all over her arm, neck & facial areas. May have eczema problems. She currently denies any itching, however, requested an order for hydrocortisone cream. She is already on Eucerin cream application. Started on Benadryl 25 mg po tid prn for itching. Discussed this case with Dr. Renard Hamper. We arrived at the decision to discontinued Seroquel, initiated Risperdal 0.5 mg bid for mood control & Mirtazapine 15 mg po Q hs for depression/insomnia/appetite stimulant.  Patient is informed of these new medication orders & instructed to report any side effects promptly. She currently denies any SIHI, AVH. Reviewed current lab results, stable. Vital signs are stable except pulse rate is slightly elevated (104). Patient is currently in no apparent distress. Will continue current plan of care as already in progress.  Principal Problem: Undifferentiated schizophrenia ([place])  Diagnosis: Principal Problem:   Undifferentiated schizophrenia ([place])  Total Time spent with patient:  35 minutes  Past Psychiatric History: See H&P.  Past Medical History:  Past Medical History:  Diagnosis Date   Asthma    History of admission to inpatient psychiatry department 12/12/2022   [facility] 10/2022   History reviewed. No pertinent surgical history.  Family History:  Family History  Family history unknown: Yes   Family Psychiatric  History: See H&P  Social History:  Social History   Substance and Sexual Activity  Alcohol Use Never     Social History   Substance and Sexual Activity  Drug Use Never    Social History   Socioeconomic History   Marital status: Married    Spouse name: Not on file   Number of children: Not on file   Years of education: Not on file   Highest education level: Not on file  Occupational History   Not on file  Tobacco Use   Smoking status: Unknown   Smokeless tobacco: Not on file  Substance and Sexual Activity   Alcohol use: Never   Drug use: Never   Sexual activity: Not on file  Other Topics Concern   Not on  file  Social History Narrative   Not on file   Social Determinants of Health   Financial Resource Strain: Not on file  Food Insecurity: No Food Insecurity (12/15/2022)   Hunger Vital Sign    Worried About Running Out of Food in the Last Year: Never true    Ran Out of Food in the Last Year: Never true  Transportation Needs: No Transportation Needs (12/15/2022)   PRAPARE - Administrator, Civil Service  (Medical): No    Lack of Transportation (Non-Medical): No  Physical Activity: Not on file  Stress: Not on file  Social Connections: Not on file   Additional Social History:   Sleep: Fair  Appetite:  Fair  Current Medications: Current Facility-Administered Medications  Medication Dose Route Frequency Provider Last Rate Last Admin   acetaminophen (TYLENOL) tablet 650 mg  650 mg Oral Q6H PRN Princess Bruins, DO       albuterol (VENTOLIN HFA) 108 (90 Base) MCG/ACT inhaler 1-2 puff  1-2 puff Inhalation Q6H PRN Princess Bruins, DO       alum & mag hydroxide-simeth (MAALOX/MYLANTA) 200-200-20 MG/5ML suspension 30 mL  30 mL Oral Q4H PRN Princess Bruins, DO       EPINEPHrine (EPI-PEN) injection 0.3 mg  0.3 mg Intramuscular PRN Princess Bruins, DO       hydrocerin (EUCERIN) cream   Topical BID Adin Hector, NP   1 Application at 12/17/22 0109   hydrOXYzine (ATARAX) tablet 25 mg  25 mg Oral TID PRN Princess Bruins, DO   25 mg at 12/16/22 2055   influenza vaccine (FLUCELVAX - egg free) injection 0.5 mL  0.5 mL Intramuscular Tomorrow-1000 [place], Harrold Donath, MD       loratadine (CLARITIN) tablet 10 mg  10 mg Oral Once Sindy Guadeloupe, NP       OLANZapine zydis (ZYPREXA) disintegrating tablet 5 mg  5 mg Oral Q8H PRN Lauro Franklin, MD       And   LORazepam (ATIVAN) tablet 1 mg  1 mg Oral PRN Lauro Franklin, MD       And   ziprasidone (GEODON) injection 20 mg  20 mg Intramuscular PRN Lauro Franklin, MD       magnesium hydroxide (MILK OF MAGNESIA) suspension 30 mL  30 mL Oral Daily PRN Princess Bruins, DO   30 mL at 12/16/22 3235   QUEtiapine (SEROQUEL) tablet 50 mg  50 mg Oral QHS Lauro Franklin, MD       Lab Results: No results found for this or any previous visit (from the past 48 hour(s)).  Blood Alcohol level:  Lab Results  Component Value Date   ETH <10 11/07/2022   ETH <10 10/28/2022   Metabolic Disorder Labs: Lab Results  Component Value Date   HGBA1C 5.8 (H)  11/07/2022   MPG 120 11/07/2022   Lab Results  Component Value Date   PROLACTIN 12.7 11/07/2022   Lab Results  Component Value Date   CHOL 240 (H) 11/07/2022   TRIG 36 11/07/2022   HDL 105 11/07/2022   CHOLHDL 2.3 11/07/2022   VLDL 7 11/07/2022   LDLCALC 128 (H) 11/07/2022   Physical Findings: AIMS:  , ,  ,  ,    CIWA:    COWS:     Musculoskeletal: Strength & Muscle Tone: within normal limits Gait & Station: normal Patient leans: N/A  Psychiatric Specialty Exam:  Presentation  General Appearance:  Appropriate for Environment; Meticulous  Eye Contact: Poor  Speech: Clear and Coherent; Normal Rate  Speech Volume: Normal  Handedness: Right  Mood and Affect  Mood: Dysphoric  Affect: Flat (guarded)  Thought Process  Thought Processes: Goal Directed  Descriptions of Associations:Circumstantial  Orientation:Full (Time, Place and Person)  Thought Content:Delusions  History of Schizophrenia/Schizoaffective disorder:No  Duration of Psychotic Symptoms:N/A  Hallucinations:Hallucinations: None  Ideas of Reference:Paranoia; Delusions  Suicidal Thoughts:Suicidal Thoughts: No  Homicidal Thoughts:Homicidal Thoughts: No  Sensorium  Memory: Immediate Fair; Recent Fair  Judgment: Intact  Insight: Shallow  Executive Functions  Concentration: Fair  Attention Span: Fair  Recall: Fair  Fund of Knowledge: Fair  Language: Fair  Psychomotor Activity  Psychomotor Activity: Psychomotor Activity: Normal  Assets  Assets: Communication Skills; Desire for Improvement; Financial Resources/Insurance; Housing; Leisure Time; Physical Health; Resilience  Sleep  Sleep: Sleep: Fair  Physical Exam: Physical Exam Vitals and nursing note reviewed.  HENT:     Nose: Nose normal.     Mouth/Throat:     Pharynx: Oropharynx is clear.  Eyes:     Pupils: Pupils are equal, round, and reactive to light.  Cardiovascular:     Rate and Rhythm: Normal  rate.     Pulses: Normal pulses.  Pulmonary:     Effort: Pulmonary effort is normal.  Genitourinary:    Comments: Deferred Musculoskeletal:        General: Normal range of motion.     Cervical back: Normal range of motion.  Skin:    General: Skin is warm and dry.  Neurological:     General: No focal deficit present.     Mental Status: She is alert and oriented to person, place, and time.    Review of Systems  Constitutional:  Negative for chills, fever and malaise/fatigue.  HENT:  Negative for congestion and sore throat.   Eyes:  Negative for blurred vision.  Respiratory:  Negative for cough, shortness of breath and wheezing.   Cardiovascular:  Negative for chest pain and palpitations.  Gastrointestinal:  Negative for abdominal pain, constipation, diarrhea, heartburn, nausea and vomiting.  Genitourinary:  Negative for dysuria.  Musculoskeletal:  Negative for myalgias.  Skin:  Negative for itching and rash.  Endo/Heme/Allergies:        See allergy lists.   Blood pressure 107/68, pulse (!) 104, temperature 98 F (36.7 C), temperature source Oral, resp. rate 16, height 5\' 1"  (1.549 m), weight 49.4 kg, last menstrual period 09/08/2012, SpO2 99 %. Body mass index is 20.6 kg/m.  Treatment Plan Summary: Daily contact with patient to assess and evaluate symptoms and progress in treatment and Medication management.   Continue inpatient hospitalization.  Will continue today 12/17/2022 plan as below except where it is noted.   Principal/active diagnoses.  Undifferentiated schizophrenia (HCC)  Plan:  Discontinued Seroquel 50 mg.  -Initiated Risperdal 0.5 mg po bid for mood control.  -Initiated Mirtazapine 15 mg po Q hs for insomnia/appetite stimulant.  -Continue Hydroxyzine 25 mg po tid prn for anxiety.   Agitation/psychosis:  -Continue Olanzapine-ODT 5 mg po tid prn. & -Ativan 1 mg po daily prn x 1 dose. & Geodon 20 mg IM prn x 1 dose.  Other medical issues. -Continue  albuterol inhaler 1-2 puff Q 6 hrs prn for SOB.  -Epi-pen injection 0.3 mg IM prn for anaphylaxis.  -Eucerin cream, apply topically to dry skin areas tid prn.  Other PRNS -Continue Tylenol 650 mg every 6 hours PRN for mild pain -Continue Maalox 30 ml Q 4 hrs PRN for indigestion -Continue MOM 30  ml po Q 6 hrs for constipation  Safety and Monitoring: Voluntary admission to inpatient psychiatric unit for safety, stabilization and treatment Daily contact with patient to assess and evaluate symptoms and progress in treatment Patient's case to be discussed in multi-disciplinary team meeting Observation Level : q15 minute checks Vital signs: q12 hours Precautions: Safety  Discharge Planning: Social work and case management to assist with discharge planning and identification of hospital follow-up needs prior to discharge Estimated LOS: 5-7 days Discharge Concerns: Need to establish a safety plan; Medication compliance and effectiveness Discharge Goals: Return home with outpatient referrals for mental health follow-up including medication management/psychotherapy  Lindell Spar, NP, pmhnp, fnp-bc. 12/17/2022, 4:45 PM

## 2022-12-17 NOTE — Progress Notes (Signed)
   12/17/22 0600  15 Minute Checks  Location Bedroom  Visual Appearance Calm  Behavior Composed  Sleep (Behavioral Health Patients Only)  Calculate sleep? (Click Yes once per 24 hr at 0600 safety check) Yes  Documented sleep last 24 hours 3.75

## 2022-12-17 NOTE — Group Note (Signed)
Recreation Therapy Group Note   Group Topic:Other  Group Date: 12/17/2022 Start Time: 1400 End Time: 1440 Facilitators: Othon Guardia-McCall, LRT,CTRS Location: [address] Dayroom   Goal Area(s) Addresses:  Patient will engage in pro-social way in music group.  Patient will follow directions of drum leader on the first prompt. Patient will demonstrate no behavioral issues during group.  Patient will identify if a reduction in stress level occurs as a result of participation in therapeutic drum circle.  Activity Description/Intervention: Therapeutic Drumming. Patients with peers and staff were given the opportunity to engage in a leader facilitated [place] with staff from the [employer], in partnership with [employer]. Nurse, adult and trained [employer], Devin Going leading with LRT observing and documenting intervention and pt response. This evidenced-based practice targets 7 areas of health and wellbeing in the human experience including: stress-reduction, exercise, self-expression, camaraderie/support, nurturing, spirituality, and music-making (leisure).     Affect/Mood: Appropriate   Participation Level: Engaged   Participation Quality: Independent   Behavior: Appropriate   Speech/Thought Process: Focused    Clinical Observations/Individualized Feedback: Patient actively engaged in therapeutic drumming exercise and discussions. Pt was appropriate with peers, staff, and musical equipment for duration of programming.     Plan: Continue to engage patient in RT group sessions 2-3x/week.   Christon Gallaway-McCall, LRT,CTRS 12/17/2022 4:03 PM

## 2022-12-17 NOTE — Progress Notes (Signed)
   12/17/22 0910  Psych Admission Type (Psych Patients Only)  Admission Status Involuntary  Psychosocial Assessment  Patient Complaints Suspiciousness;Isolation;Worrying  Eye Contact Fair  Facial Expression Anxious;Worried  Affect Appropriate to circumstance  Catering manager Activity Slow  Appearance/Hygiene Unremarkable  Behavior Characteristics Cooperative  Mood Anxious  Aggressive Behavior  Effect No apparent injury  Thought Process  Coherency Concrete thinking  Content WDL  Delusions None reported or observed  Perception WDL  Hallucination None reported or observed  Judgment Limited  Confusion None  Danger to Self  Current suicidal ideation? Denies  Self-Injurious Behavior No self-injurious ideation or behavior indicators observed or expressed   Agreement Not to Harm Self Yes  Description of Agreement Verbal contract  Danger to Others  Danger to Others None reported or observed

## 2022-12-17 NOTE — Group Note (Signed)
Recreation Therapy Group Note   Group Topic:Self-Esteem  Group Date: 12/17/2022 Start Time: 1000 End Time: 9211 Facilitators: Octavia Mottola-McCall, LRT,CTRS Location: 500 Hall Dayroom   Goal Area(s) Addresses:  Patient will appropriately identify what self esteem is.  Patient will create a shield of armor describing themselves.  Patient will successfully identify positive attributes about themselves.  Patient will acknowledge benefit of improved self-esteem.    Group Description: Self-Esteem Shield. Patient attended a recreation therapy group session focused on self esteem. Patient identified what self esteem is, and why it is important to have high self esteem during group discussion. LRT wrote on the white board, drawing the outline of the shield and labeling the quadrants.  Patient was asked to create their own shield to show off their unique attributes, four quadrants reflected the following:  The Upper Left quadrant- reasons they are unique/special. The Upper Right quadrant- things that they love to do. The Lower Left quadrant- goals for their future. The Lower Right quadrant- character words that describe them.    Patients were provided sheets with the shield printed on them and colored pencils, markers and crayons to complete the activity.  Patients and writer had group related discussions while individually working on their activity.  Patients were debriefed on the importance of healthy self esteem and offered a handout for ways to increase self esteem.   Affect/Mood: Flat   Participation Level: Active   Participation Quality: Independent   Behavior: Appropriate   Speech/Thought Process: Focused   Insight: Good   Judgement: Good   Modes of Intervention: Art and Music   Patient Response to Interventions:  Engaged   Education Outcome:  Acknowledges education and In group clarification offered    Clinical Observations/Individualized Feedback: Pt was quiet and  focused on completing the assignment.  Pt expressed being unique due to making a difference and getting back up when she falls.  Pt identified the things she does as living in the moment and being calm/relaxed.  Pt goal for the future is to stand up for what she believes in and find her inner beauty and strength.  Pt expressed her character words as "my happiness is up to me and I'm becoming the best version of myself".     Plan: Continue to engage patient in RT group sessions 2-3x/week.   Rhian Funari-McCall, LRT,CTRS 12/17/2022 11:53 AM

## 2022-12-17 NOTE — BH IP Treatment Plan (Signed)
Interdisciplinary Treatment and Diagnostic Plan Update  12/17/2022 Time of Session: 9:40am  Brandy Hogan MRN: [id number]  Principal Diagnosis: Schizophrenia [facility])  Secondary Diagnoses: Principal Problem:   Schizophrenia ([place])   Current Medications:  Current Facility-Administered Medications  Medication Dose Route Frequency Provider Last Rate Last Admin   acetaminophen (TYLENOL) tablet 650 mg  650 mg Oral Q6H PRN Merrily Brittle, DO       albuterol (VENTOLIN HFA) 108 (90 Base) MCG/ACT inhaler 1-2 puff  1-2 puff Inhalation Q6H PRN Merrily Brittle, DO       alum & mag hydroxide-simeth (MAALOX/MYLANTA) 200-200-20 MG/5ML suspension 30 mL  30 mL Oral Q4H PRN Merrily Brittle, DO       EPINEPHrine (EPI-PEN) injection 0.3 mg  0.3 mg Intramuscular PRN Merrily Brittle, DO       hydrocerin (EUCERIN) cream   Topical BID Earney Mallet, NP   1 Application at 40/98/11 9147   hydrOXYzine (ATARAX) tablet 25 mg  25 mg Oral TID PRN Merrily Brittle, DO   25 mg at 12/16/22 2055   influenza vaccine (FLUCELVAX - egg free) injection 0.5 mL  0.5 mL Intramuscular Tomorrow-1000 [place], Ovid Curd, MD       loratadine (CLARITIN) tablet 10 mg  10 mg Oral Once Evette Georges, NP       OLANZapine zydis (ZYPREXA) disintegrating tablet 5 mg  5 mg Oral Q8H PRN Briant Cedar, MD       And   LORazepam (ATIVAN) tablet 1 mg  1 mg Oral PRN Briant Cedar, MD       And   ziprasidone (GEODON) injection 20 mg  20 mg Intramuscular PRN Briant Cedar, MD       magnesium hydroxide (MILK OF MAGNESIA) suspension 30 mL  30 mL Oral Daily PRN Merrily Brittle, DO   30 mL at 12/16/22 8295   QUEtiapine (SEROQUEL) tablet 50 mg  50 mg Oral QHS Briant Cedar, MD       PTA Medications: Medications Prior to Admission  Medication Sig Dispense Refill Last Dose   albuterol (VENTOLIN HFA) 108 (90 Base) MCG/ACT inhaler Inhale into the lungs every 6 (six) hours as needed for wheezing or shortness of breath.       calcium carbonate (OSCAL) 1500 (600 Ca) MG TABS tablet Take 600 mg of elemental calcium by mouth daily.      cyanocobalamin 1000 MCG tablet Take 1,000 mcg by mouth daily.      EPINEPHrine 0.3 mg/0.3 mL IJ SOAJ injection Inject 0.3 mg into the muscle as needed for anaphylaxis.      Multiple Vitamin (MULTIVITAMIN WITH MINERALS) TABS tablet Take 1 tablet by mouth daily.       Patient Stressors:    Patient Strengths:    Treatment Modalities: Medication Management, Group therapy, Case management,  1 to 1 session with clinician, Psychoeducation, Recreational therapy.   Physician Treatment Plan for Primary Diagnosis: Schizophrenia ([place]) Long Term Goal(s): Improvement in symptoms so as ready for discharge   Short Term Goals: Ability to verbalize feelings will improve Ability to identify and develop effective coping behaviors will improve Ability to maintain clinical measurements within normal limits will improve Compliance with prescribed medications will improve  Medication Management: Evaluate patient's response, side effects, and tolerance of medication regimen.  Therapeutic Interventions: 1 to 1 sessions, Unit Group sessions and Medication administration.  Evaluation of Outcomes: Not Met  Physician Treatment Plan for Secondary Diagnosis: Principal Problem:   Schizophrenia ([place])  Long Term Goal(s):  Improvement in symptoms so as ready for discharge   Short Term Goals: Ability to verbalize feelings will improve Ability to identify and develop effective coping behaviors will improve Ability to maintain clinical measurements within normal limits will improve Compliance with prescribed medications will improve     Medication Management: Evaluate patient's response, side effects, and tolerance of medication regimen.  Therapeutic Interventions: 1 to 1 sessions, Unit Group sessions and Medication administration.  Evaluation of Outcomes: Not Met   RN Treatment Plan for Primary  Diagnosis: Schizophrenia ([place]) Long Term Goal(s): Knowledge of disease and therapeutic regimen to maintain health will improve  Short Term Goals: Ability to remain free from injury will improve, Ability to participate in decision making will improve, Ability to verbalize feelings will improve, Ability to disclose and discuss suicidal ideas, and Ability to identify and develop effective coping behaviors will improve  Medication Management: RN will administer medications as ordered by provider, will assess and evaluate patient's response and provide education to patient for prescribed medication. RN will report any adverse and/or side effects to prescribing provider.  Therapeutic Interventions: 1 on 1 counseling sessions, Psychoeducation, Medication administration, Evaluate responses to treatment, Monitor vital signs and CBGs as ordered, Perform/monitor CIWA, COWS, AIMS and Fall Risk screenings as ordered, Perform wound care treatments as ordered.  Evaluation of Outcomes: Not Met   LCSW Treatment Plan for Primary Diagnosis: Schizophrenia ([place]) Long Term Goal(s): Safe transition to appropriate next level of care at discharge, Engage patient in therapeutic group addressing interpersonal concerns.  Short Term Goals: Engage patient in aftercare planning with referrals and resources, Increase social support, Increase emotional regulation, Facilitate acceptance of mental health diagnosis and concerns, Identify triggers associated with mental health/substance abuse issues, and Increase skills for wellness and recovery  Therapeutic Interventions: Assess for all discharge needs, 1 to 1 time with Social worker, Explore available resources and support systems, Assess for adequacy in community support network, Educate family and significant other(s) on suicide prevention, Complete Psychosocial Assessment, Interpersonal group therapy.  Evaluation of Outcomes: Not Met   Progress in Treatment: Attending  groups: Yes. Participating in groups: Yes. Taking medication as prescribed: Yes. Toleration medication: Yes. Family/Significant other contact made: Yes, individual(s) contacted:  Husband and Brother  Patient understands diagnosis: No. Discussing patient identified problems/goals with staff: Yes. Medical problems stabilized or resolved: Yes. Denies suicidal/homicidal ideation: Yes. Issues/concerns per patient self-inventory: No.   New problem(s) identified: No, Describe:  None reported   New Short Term/Long Term Goal(s):  medication stabilization, elimination of SI thoughts, development of comprehensive mental wellness plan.   Patient Goals: "To get more sleep"   Discharge Plan or Barriers: Patient recently admitted. CSW will continue to follow and assess for appropriate referrals and possible discharge planning.   Reason for Continuation of Hospitalization: Anxiety Depression Medication stabilization  Estimated Length of Stay: 3 to 7 days   Last 3 [place] Suicide Severity Risk Score: [place] Admission (Current) from 12/15/2022 in [place] [facility] ED from 12/12/2022 in [facility] ED from 11/07/2022 in [place] No Risk No Risk No Risk       Last [facility] 2/9 Scores:     No data to display          Scribe for Treatment Team: Carney Harder 12/17/2022 11:37 AM

## 2022-12-17 NOTE — Group Note (Signed)
LCSW Group Therapy 06/23/20 1:40pm   Type of Therapy and Topic:  Group Therapy:  Setting Goals   Participation Level:  Active   Description of Group: In this process group, patients discussed using strengths to work toward goals and address challenges.  Patients identified two positive things about themselves and one goal they were working on.  Patients were given the opportunity to share openly and support each other's plan for self-empowerment.  The group discussed the value of gratitude and were encouraged to have a daily reflection of positive characteristics or circumstances.  Patients were encouraged to identify a plan to utilize their strengths to work on current challenges and goals.   Therapeutic Goals Patient will verbalize personal strengths/positive qualities and relate how these can assist with achieving desired personal goals Patients will verbalize affirmation of peers plans for personal change and goal setting Patients will explore the value of gratitude and positive focus as related to successful achievement of goals Patients will verbalize a plan for regular reinforcement of personal positive qualities and circumstances.   Summary of Patient Progress: Patient identified the definition of goals. Patients was given the opportunity to share openly and support other group members' plan for self-empowerment. Patient verbalized personal strength and how they relate to achieving the desired goal. Patient was able to identify positive goals to work towards when she returns home.        Therapeutic Modalities Cognitive Behavioral Therapy Motivational Interviewing       Real Cona, LCSW, [place] Social Worker  [facility]

## 2022-12-17 NOTE — Progress Notes (Signed)
Pt continues to be suspicious and paranoid on the unit, but took her medications with minimal resistance    12/17/22 2045  Psych Admission Type (Psych Patients Only)  Admission Status Involuntary  Psychosocial Assessment  Patient Complaints Suspiciousness  Eye Contact Fair  Facial Expression Anxious;Worried  Affect Anxious  Catering manager Activity Slow  Appearance/Hygiene Unremarkable  Behavior Characteristics Guarded;Anxious  Mood Preoccupied;Suspicious  Aggressive Behavior  Effect No apparent injury  Thought Process  Coherency Blocking  Content WDL  Delusions WDL  Perception WDL  Hallucination None reported or observed  Judgment Limited  Confusion None  Danger to Self  Current suicidal ideation? Denies  Danger to Others  Danger to Others None reported or observed

## 2022-12-17 NOTE — BHH Group Notes (Signed)
Pt did not attend wrap-up group   

## 2022-12-18 DIAGNOSIS — F203 Undifferentiated schizophrenia: Secondary | ICD-10-CM | POA: Diagnosis not present

## 2022-12-18 MED ORDER — RISPERIDONE 1 MG PO TABS
1.0000 mg | ORAL_TABLET | Freq: Every day | ORAL | Status: DC
  Filled 2022-12-18 (×4): qty 1

## 2022-12-18 MED ORDER — HYDROCORTISONE 0.5 % EX CREA
TOPICAL_CREAM | Freq: Two times a day (BID) | CUTANEOUS | Status: DC
  Administered 2022-12-18 – 2022-12-21 (×5): 1 via TOPICAL
  Filled 2022-12-18: qty 28.35

## 2022-12-18 MED ORDER — NONFORMULARY OR COMPOUNDED ITEM: 1.0000 | Freq: Two times a day (BID) | Status: DC | PRN

## 2022-12-18 NOTE — Progress Notes (Signed)
[facility] MD Progress Note  12/18/2022 3:57 PM Verenis Nicosia  MRN:  [id number]  Reason for admission: 57 yr old female who presented on 12/30 to [facility] for issues with sleep, she was placed under IVC for delusions and paranoia, she was admitted to [facility] on 1/2. PPHx is significant for Psychosis and 1 Previous Psychiatric Hospitalization [facility] 10/2022), and no history of Suicide Attempts or self Injurious Behavior.   Daily notes: Coral is seen in her room. Chart reviewed. The chart findings discussed with the treatment team. She presents alert, oriented & aware of situation. She is barely making eye contact. Will look up when prompted to do so. She is verbally responsive. Seem a lot clearer today & less tangential in her speech. She is also less delusional as well. She reports, "I'm itching a lot today, especially when I'm stressed. The medications help a little bit on the negative thoughts. I talked to the doctor earlier today that I want to start working on my discharge plan. It is safer for me at home. I sleep better at home without anyone checking & opening the door every 15 minutes. I'm just ready to go home. Being here makes feel sad. I want to go back to my family. There is nothing to do here. I do read some, but can't watch want I want on TV because everyone wants to watch something else. I refused the Risperdal because I don't want to take it twice daily. I would want the doses combined to nightly, then I will try to taking it. I do not want this medicine in the morning. I'm attending group sessions. I have learned to work on positive affirmation". Patient currently denies any SIHI, AVH, delusional thoughts or paranoia. She does not appear to be responding to any internal stimuli. The Risperdal 0.5 mg doses has been combined to 1 mg po Q hs. Patient's brother Janine Limbo, MD requested & received a call from this provider in the morning/this evening. He enquired about the medicines the sister was  being given & the provisional diagnosis. Requested & update on how she is doing & responding to treatments. This information was provided for him. He also gave a brief hx of the problem with eczema that the sister has been dealing with over the years. He did mentioned that the sister uses Hydrocortisone cream at home for the itching associated with her eczema. He instructed that it will be a good idea to start her on this cream although there was a caution that patient may be allergic to it since she had an anaphylactic reaction to Methylprednisolone in the past. He also instructed to resume patient on her anti-itch cream that her family brought from her home. These instructions from patient's brother has been honored. Patient consented to this phone call between this provider & her brother.   Principal Problem: Undifferentiated schizophrenia ([place])  Diagnosis: Principal Problem:   Undifferentiated schizophrenia ([place])  Total Time spent with patient:  35 minutes  Past Psychiatric History: See H&P.  Past Medical History:  Past Medical History:  Diagnosis Date   Asthma    History of admission to inpatient psychiatry department 12/12/2022   [facility] 10/2022   History reviewed. No pertinent surgical history.  Family History:  Family History  Family history unknown: Yes   Family Psychiatric  History: See H&P  Social History:  Social History   Substance and Sexual Activity  Alcohol Use Never     Social History  Substance and Sexual Activity  Drug Use Never    Social History   Socioeconomic History   Marital status: Married    Spouse name: Not on file   Number of children: Not on file   Years of education: Not on file   Highest education level: Not on file  Occupational History   Not on file  Tobacco Use   Smoking status: Unknown   Smokeless tobacco: Not on file  Substance and Sexual Activity   Alcohol use: Never   Drug use: Never   Sexual activity: Not on file  Other  Topics Concern   Not on file  Social History Narrative   Not on file   Social Determinants of Health   Financial Resource Strain: Not on file  Food Insecurity: No Food Insecurity (12/15/2022)   Hunger Vital Sign    Worried About Running Out of Food in the Last Year: Never true    Ran Out of Food in the Last Year: Never true  Transportation Needs: No Transportation Needs (12/15/2022)   PRAPARE - Hydrologist (Medical): No    Lack of Transportation (Non-Medical): No  Physical Activity: Not on file  Stress: Not on file  Social Connections: Not on file   Additional Social History:   Sleep: Fair  Appetite:  Fair  Current Medications: Current Facility-Administered Medications  Medication Dose Route Frequency Provider Last Rate Last Admin   acetaminophen (TYLENOL) tablet 650 mg  650 mg Oral Q6H PRN Merrily Brittle, DO       albuterol (VENTOLIN HFA) 108 (90 Base) MCG/ACT inhaler 1-2 puff  1-2 puff Inhalation Q6H PRN Merrily Brittle, DO       alum & mag hydroxide-simeth (MAALOX/MYLANTA) 200-200-20 MG/5ML suspension 30 mL  30 mL Oral Q4H PRN Merrily Brittle, DO       diphenhydrAMINE (BENADRYL) capsule 25 mg  25 mg Oral Q8H PRN Lindell Spar I, NP       EPINEPHrine (EPI-PEN) injection 0.3 mg  0.3 mg Intramuscular PRN Merrily Brittle, DO       hydrocerin (EUCERIN) cream   Topical BID Earney Mallet, NP   1 Application at [id number]   hydrocortisone cream 0.5 %   Topical BID Lindell Spar I, NP       hydrOXYzine (ATARAX) tablet 25 mg  25 mg Oral TID PRN Merrily Brittle, DO   25 mg at 12/16/22 2055   influenza vaccine (FLUCELVAX - egg free) injection 0.5 mL  0.5 mL Intramuscular Tomorrow-1000 [place], Ovid Curd, MD       loratadine (CLARITIN) tablet 10 mg  10 mg Oral Once Evette Georges, NP       OLANZapine zydis (ZYPREXA) disintegrating tablet 5 mg  5 mg Oral Q8H PRN Briant Cedar, MD       And   LORazepam (ATIVAN) tablet 1 mg  1 mg Oral PRN Briant Cedar, MD       And   ziprasidone (GEODON) injection 20 mg  20 mg Intramuscular PRN Briant Cedar, MD       magnesium hydroxide (MILK OF MAGNESIA) suspension 30 mL  30 mL Oral Daily PRN Merrily Brittle, DO   30 mL at 12/16/22 0639   mirtazapine (REMERON) tablet 15 mg  15 mg Oral QHS Lindell Spar I, NP   15 mg at 12/17/22 2021   NONFORMULARY OR COMPOUNDED ITEM 1 Application  1 Application Topical BID PRN Ranae Palms, MD       [  START ON 12/19/2022] risperiDONE (RISPERDAL) tablet 1 mg  1 mg Oral QHS Letesha Klecker I, NP       Lab Results: No results found for this or any previous visit (from the past 48 hour(s)).  Blood Alcohol level:  Lab Results  Component Value Date   ETH <10 11/07/2022   ETH <10 10/28/2022   Metabolic Disorder Labs: Lab Results  Component Value Date   HGBA1C 5.8 (H) 11/07/2022   MPG 120 11/07/2022   Lab Results  Component Value Date   PROLACTIN 12.7 11/07/2022   Lab Results  Component Value Date   CHOL 240 (H) 11/07/2022   TRIG 36 11/07/2022   HDL 105 11/07/2022   CHOLHDL 2.3 11/07/2022   VLDL 7 11/07/2022   LDLCALC 128 (H) 11/07/2022   Physical Findings: AIMS: Facial and Oral Movements Muscles of Facial Expression: None, normal Lips and Perioral Area: None, normal Jaw: None, normal Tongue: None, normal,Extremity Movements Upper (arms, wrists, hands, fingers): None, normal Lower (legs, knees, ankles, toes): None, normal, Trunk Movements Neck, shoulders, hips: None, normal, Overall Severity Severity of abnormal movements (highest score from questions above): None, normal Incapacitation due to abnormal movements: None, normal Patient's awareness of abnormal movements (rate only patient's report): No Awareness, Dental Status Current problems with teeth and/or dentures?: No Does patient usually wear dentures?: No  CIWA:    COWS:     Musculoskeletal: Strength & Muscle Tone: within normal limits Gait & Station: normal Patient leans:  N/A  Psychiatric Specialty Exam:  Presentation  General Appearance:  Casual; Fairly Groomed  Eye Contact: Minimal  Speech: Clear and Coherent; Normal Rate  Speech Volume: Normal  Handedness: Right  Mood and Affect  Mood: -- (Some improvement noted.)  Affect: Flat  Thought Process  Thought Processes: Coherent; Goal Directed  Descriptions of Associations:Intact  Orientation:Full (Time, Place and Person)  Thought Content:Perseveration; Rumination  History of Schizophrenia/Schizoaffective disorder:Yes  Duration of Psychotic Symptoms:Greater than six months  Hallucinations:Hallucinations: None   Ideas of Reference:None  Suicidal Thoughts:Suicidal Thoughts: No  Homicidal Thoughts:Homicidal Thoughts: No  Sensorium  Memory: Immediate Fair; Recent Fair; Remote Fair  Judgment: Fair  Insight: Good  Executive Functions  Concentration: Fair  Attention Span: Fair  Recall: Fair  Fund of Knowledge: Fair  Language: Good  Psychomotor Activity  Psychomotor Activity: Psychomotor Activity: Normal  Assets  Assets: Communication Skills; Desire for Improvement; Financial Resources/Insurance; Housing; Resilience; Social Support  Sleep  Sleep: Sleep: Good Number of Hours of Sleep: 7.5  Physical Exam: Physical Exam Vitals and nursing note reviewed.  HENT:     Nose: Nose normal.     Mouth/Throat:     Pharynx: Oropharynx is clear.  Eyes:     Pupils: Pupils are equal, round, and reactive to light.  Cardiovascular:     Rate and Rhythm: Normal rate.     Pulses: Normal pulses.  Pulmonary:     Effort: Pulmonary effort is normal.  Genitourinary:    Comments: Deferred Musculoskeletal:        General: Normal range of motion.     Cervical back: Normal range of motion.  Skin:    General: Skin is warm and dry.  Neurological:     General: No focal deficit present.     Mental Status: She is alert and oriented to person, place, and time.     Review of Systems  Constitutional:  Negative for chills, fever and malaise/fatigue.  HENT:  Negative for congestion and sore throat.   Eyes:  Negative for blurred vision.  Respiratory:  Negative for cough, shortness of breath and wheezing.   Cardiovascular:  Negative for chest pain and palpitations.  Gastrointestinal:  Negative for abdominal pain, constipation, diarrhea, heartburn, nausea and vomiting.  Genitourinary:  Negative for dysuria.  Musculoskeletal:  Negative for myalgias.  Skin:  Negative for itching and rash.  Endo/Heme/Allergies:        See allergy lists.   Blood pressure 120/80, pulse (!) 105, temperature 97.9 F (36.6 C), temperature source Oral, resp. rate 16, height 5\' 1"  (1.549 m), weight 49.4 kg, last menstrual period 09/08/2012, SpO2 99 %. Body mass index is 20.6 kg/m.  Treatment Plan Summary: Daily contact with patient to assess and evaluate symptoms and progress in treatment and Medication management.   Continue inpatient hospitalization.  Will continue today 12/18/2022 plan as below except where it is noted.   Principal/active diagnoses.  Undifferentiated schizophrenia ([place])  Plan:  Discontinued Seroquel 50 mg.  -Combined Risperdal 0.5 mg po bid to Risperdal 1 mg po Q hs for mood control.  -Continue Mirtazapine 15 mg po Q hs for insomnia/appetite stimulant.  -Continue Hydroxyzine 25 mg po tid prn for anxiety.   Agitation/psychosis:  -Continue Olanzapine-ODT 5 mg po tid prn. & -Ativan 1 mg po daily prn x 1 dose. & Geodon 20 mg IM prn x 1 dose.  Other medical issues. -Continue albuterol inhaler 1-2 puff Q 6 hrs prn for SOB.  -Epi-pen injection 0.3 mg IM prn for anaphylaxis.  -Eucerin cream, apply topically to dry skin areas tid prn.   Other PRNS -Continue Tylenol 650 mg every 6 hours PRN for mild pain -Continue Maalox 30 ml Q 4 hrs PRN for indigestion -Continue MOM 30 ml po Q 6 hrs for constipation.  -Continue Hydrocortisone 0.5 %, apply to  affected areas topically for itching. -Continue nonformulary cream, bid prn for eczema.  Safety and Monitoring: Voluntary admission to inpatient psychiatric unit for safety, stabilization and treatment Daily contact with patient to assess and evaluate symptoms and progress in treatment Patient's case to be discussed in multi-disciplinary team meeting Observation Level : q15 minute checks Vital signs: q12 hours Precautions: Safety  Discharge Planning: Social work and case management to assist with discharge planning and identification of hospital follow-up needs prior to discharge Estimated LOS: 5-7 days Discharge Concerns: Need to establish a safety plan; Medication compliance and effectiveness Discharge Goals: Return home with outpatient referrals for mental health follow-up including medication management/psychotherapy  Lindell Spar, NP, pmhnp, fnp-bc. 12/18/2022, 3:57 PMPatient ID: Weldon Inches, female   DOB: 04/11/1966, 57 y.o.   MRN: [id number]

## 2022-12-18 NOTE — Group Note (Signed)
Recreation Therapy Group Note   Group Topic:Health and Wellness  Group Date: 12/18/2022 Start Time: 1000 End Time: 1040 Facilitators: Paytan Recine-McCall, LRT,CTRS Location: [address] Dayroom   Goal Area(s) Addresses:  Patient will define components of whole wellness. Patient will verbalize benefit of whole wellness.   Group Description:  Wellness.  LRT and patients discussed the importance of wellness and its affect on the body.  LRT then explained to patients, they would engage in movement (dancing, exercises, etc).  LRT also let the patients know they would be leading the group.  Whatever, one patient chose to do, everyone else would follow along.  This gave everyone a chance to engage in the movement of their choice.   Affect/Mood: Flat   Participation Level: Active   Participation Quality: Independent   Behavior: Appropriate   Speech/Thought Process: Focused   Insight: Good   Judgement: Good   Modes of Intervention: Music   Patient Response to Interventions:  Engaged   Education Outcome:  Acknowledges education and In group clarification offered    Clinical Observations/Individualized Feedback: Pt was flat at the beginning of group and was hesitant to participate.  Pt eventually got involved with the activity with some encouragement from staff and peers.  Pt began to smile more and loosen up as group went on.     Plan: Continue to engage patient in RT group sessions 2-3x/week.   Luis Sami-McCall, LRT,CTRS 12/18/2022 11:58 AM

## 2022-12-18 NOTE — BHH Group Notes (Signed)
Pt attended wrap-up group. Pt was able to work on identifying and regulate emotions.

## 2022-12-18 NOTE — Progress Notes (Signed)
Adult Psychoeducational Group Note  Date:  12/18/2022 Time:  11:01 AM  Group Topic/Focus:  Goals Group:   The focus of this group is to help patients establish daily goals to achieve during treatment and discuss how the patient can incorporate goal setting into their daily lives to aide in recovery.  Participation Level:  Active  Participation Quality:  Appropriate  Affect:  Appropriate  Cognitive:  Appropriate  Insight: Appropriate  Engagement in Group:  Engaged  Modes of Intervention:  Discussion  Additional Comments:  Patient attended morning orientation group and participated.   Manette Doto W Lillard Bailon 03/17/3535, 11:01 AM

## 2022-12-18 NOTE — Progress Notes (Signed)
   12/18/22 0916  Psych Admission Type (Psych Patients Only)  Admission Status Involuntary  Psychosocial Assessment  Patient Complaints Anxiety  Eye Contact Fair  Facial Expression Worried;Flat  Affect Appropriate to circumstance  Speech Logical/coherent  Interaction Assertive  Motor Activity Slow  Appearance/Hygiene Unremarkable  Behavior Characteristics Cooperative;Guarded  Mood Suspicious  Aggressive Behavior  Effect No apparent injury  Thought Process  Coherency WDL  Content WDL  Delusions None reported or observed  Perception WDL  Hallucination None reported or observed  Judgment Limited  Confusion None  Danger to Self  Current suicidal ideation? Denies  Self-Injurious Behavior No self-injurious ideation or behavior indicators observed or expressed   Agreement Not to Harm Self Yes  Description of Agreement Verbal contract  Danger to Others  Danger to Others None reported or observed

## 2022-12-18 NOTE — Progress Notes (Signed)
   12/18/22 0604  15 Minute Checks  Location Bedroom  Visual Appearance Calm  Behavior Sleeping  Sleep (Behavioral Health Patients Only)  Calculate sleep? (Click Yes once per 24 hr at 0600 safety check) Yes  Documented sleep last 24 hours 7.5

## 2022-12-19 DIAGNOSIS — F203 Undifferentiated schizophrenia: Secondary | ICD-10-CM | POA: Diagnosis not present

## 2022-12-19 NOTE — Progress Notes (Signed)
Adult Psychoeducational Group Note  Date:  12/19/2022 Time:  6:55 PM  Group Topic/Focus:  Goals Group:   The focus of this group is to help patients establish daily goals to achieve during treatment and discuss how the patient can incorporate goal setting into their daily lives to aide in recovery.  Participation Level:  Did Not Attend  Participation Quality:   n/a  Affect:   n/a  Cognitive:   n/a  Insight: None  Engagement in Group:   n/a  Modes of Intervention:   n/a  Additional Comments:   Pt did not attend the morning goals group.  Wetzel Bjornstad Camani Sesay 12/19/2022, 6:55 PM

## 2022-12-19 NOTE — BHH Group Notes (Signed)
Spirituality group facilitated by Kathrynn Humble, [place].  Group Description: Group focused on topic of hope. Patients participated in facilitated discussion around topic, connecting with one another around experiences and definitions for hope. Group members engaged with visual explorer photos, reflecting on what hope looks like for them today. Group engaged in discussion around how their definitions of hope are present today in hospital.  Modalities: Psycho-social ed, Adlerian, Narrative, MI  Patient Progress: Jaquaya attended group and actively participated in group conversation and activities.  Comments were appropriate and on topic and showed fair insight into the topic.  Chaplain Janne Napoleon, Stacy PAger, [phone]

## 2022-12-19 NOTE — Progress Notes (Signed)
   12/19/22 0559  15 Minute Checks  Location Bedroom  Visual Appearance Calm  Behavior Sleeping  Sleep (Behavioral Health Patients Only)  Calculate sleep? (Click Yes once per 24 hr at 0600 safety check) Yes  Documented sleep last 24 hours 8.75

## 2022-12-19 NOTE — Group Note (Signed)
Recreation Therapy Group Note   Group Topic:Team Building  Group Date: 12/19/2022 Start Time: 1000 End Time: 1057 Facilitators: Jacarius Handel-McCall, LRT,CTRS Location: [address] Dayroom   Goal Area(s) Addresses:  Patient will effectively work with peer towards shared goal.  Patient will identify skills used to make activity successful.  Patient will identify how skills used during activity can be used to reach post d/c goals.   Group Description: Straw Bridge. In teams of 3-5, patients were given 15 plastic drinking straws and an equal length of masking tape. Using the materials provided, patients were instructed to build a free standing bridge-like structure to suspend an everyday item (ex: puzzle box) off of the floor or table surface. All materials were required to be used by the team in their design. LRT facilitated post-activity discussion reviewing team process. Patients were encouraged to reflect how the skills used in this activity can be generalized to daily life post discharge.    Affect/Mood: N/A   Participation Level: Did not attend    Clinical Observations/Individualized Feedback:     Plan: Continue to engage patient in RT group sessions 2-3x/week.   Nell Schrack-McCall, LRT,CTRS 12/19/2022 12:09 PM

## 2022-12-19 NOTE — Progress Notes (Signed)
The focus of this group is to help patients review their daily goal of treatment and discuss progress on daily workbooks. Pt did not attend the evening group. 

## 2022-12-19 NOTE — Progress Notes (Signed)
[facility] MD Progress Note  12/19/2022 9:06 AM Brandy Hogan  MRN:  [id number]  Reason for admission: 57 yr old female who presented on 12/30 to [facility] for issues with sleep, she was placed under IVC for delusions and paranoia, she was admitted to [facility] on 1/2. PPHx is significant for Psychosis and 1 Previous Psychiatric Hospitalization [facility] 10/2022), and no history of Suicide Attempts or self Injurious Behavior.   Daily notes: Brandy Hogan is seen in her room. She presents alert, oriented & aware of situation. She is verbally responsive, her speech is coherent & appropriate to situation. However, she is making a fair eye contact, usually will make an eye contact when prompted to do so. She reports, "My mood is not great. I'm just frustrated because I want to go home & you guys would not let me. I feel more frustrated that depressed. I slept well last night. I took the medicine, no side effects. All I need is an exit plan so I can go home". Patient is instructed & encouraged that she will eventually get discharged to her home with family, however, we are working on stabilizing her symptoms so she can go home. Patient's brother Brandy Hogan came by this evening with a hand written note for his sister. He asked to speak with this provider. As this provider met with Dr. Jodell Hogan, he stated that his sister called her husband & expressed that she is under IVC. Dr. Jodell Hogan expressed that none of his family member IVC'ed his sister to the hospital. He expressed that his sister has signed 72 hrs discharge notice & should be discharged once this notice is due. However, has checked patient's chart, there were no 72 hrs discharge notice signed. And because patient is under IVC, she may not sign a 72 hour discharge notice.  This provider explained to patient;s brother that discharging a patient on a week is possible when there is already outpatient follow-up care appointment already made. Patient remains active in the group sessions.  There are no behavioral issues noted by staff. We will continue current plan of care as already in progress. No changes made.  Principal Problem: Undifferentiated schizophrenia ([place])  Diagnosis: Principal Problem:   Undifferentiated schizophrenia ([place])  Total Time spent with patient:  35 minutes  Past Psychiatric History: See H&P.  Past Medical History:  Past Medical History:  Diagnosis Date   Asthma    History of admission to inpatient psychiatry department 12/12/2022   [facility] 10/2022   History reviewed. No pertinent surgical history.  Family History:  Family History  Family history unknown: Yes   Family Psychiatric  History: See H&P  Social History:  Social History   Substance and Sexual Activity  Alcohol Use Never     Social History   Substance and Sexual Activity  Drug Use Never    Social History   Socioeconomic History   Marital status: Married    Spouse name: Not on file   Number of children: Not on file   Years of education: Not on file   Highest education level: Not on file  Occupational History   Not on file  Tobacco Use   Smoking status: Unknown   Smokeless tobacco: Not on file  Substance and Sexual Activity   Alcohol use: Never   Drug use: Never   Sexual activity: Not on file  Other Topics Concern   Not on file  Social History Narrative   Not on file   Social Determinants of Health  Financial Resource Strain: Not on file  Food Insecurity: No Food Insecurity (12/15/2022)   Hunger Vital Sign    Worried About Running Out of Food in the Last Year: Never true    Ran Out of Food in the Last Year: Never true  Transportation Needs: No Transportation Needs (12/15/2022)   PRAPARE - Hydrologist (Medical): No    Lack of Transportation (Non-Medical): No  Physical Activity: Not on file  Stress: Not on file  Social Connections: Not on file   Additional Social History:   Sleep: Fair  Appetite:  Fair  Current  Medications: Current Facility-Administered Medications  Medication Dose Route Frequency Provider Last Rate Last Admin   acetaminophen (TYLENOL) tablet 650 mg  650 mg Oral Q6H PRN Merrily Brittle, DO       albuterol (VENTOLIN HFA) 108 (90 Base) MCG/ACT inhaler 1-2 puff  1-2 puff Inhalation Q6H PRN Merrily Brittle, DO       alum & mag hydroxide-simeth (MAALOX/MYLANTA) 200-200-20 MG/5ML suspension 30 mL  30 mL Oral Q4H PRN Merrily Brittle, DO       diphenhydrAMINE (BENADRYL) capsule 25 mg  25 mg Oral Q8H PRN Lindell Spar I, NP       EPINEPHrine (EPI-PEN) injection 0.3 mg  0.3 mg Intramuscular PRN Merrily Brittle, DO       hydrocerin (EUCERIN) cream   Topical BID Earney Mallet, NP   1 Application at 67/89/38 1017   hydrocortisone cream 0.5 %   Topical BID Lindell Spar I, NP   1 Application at 51/02/58 5277   hydrOXYzine (ATARAX) tablet 25 mg  25 mg Oral TID PRN Merrily Brittle, DO   25 mg at 12/16/22 2055   influenza vaccine (FLUCELVAX - egg free) injection 0.5 mL  0.5 mL Intramuscular Tomorrow-1000 [place], Ovid Curd, MD       loratadine (CLARITIN) tablet 10 mg  10 mg Oral Once Evette Georges, NP       OLANZapine zydis (ZYPREXA) disintegrating tablet 5 mg  5 mg Oral Q8H PRN Briant Cedar, MD       And   LORazepam (ATIVAN) tablet 1 mg  1 mg Oral PRN Briant Cedar, MD       And   ziprasidone (GEODON) injection 20 mg  20 mg Intramuscular PRN Briant Cedar, MD       magnesium hydroxide (MILK OF MAGNESIA) suspension 30 mL  30 mL Oral Daily PRN Merrily Brittle, DO   30 mL at 12/16/22 8242   mirtazapine (REMERON) tablet 15 mg  15 mg Oral QHS Lindell Spar I, NP   15 mg at 12/18/22 2046   NONFORMULARY OR COMPOUNDED ITEM 1 Application  1 Application Topical BID PRN Ranae Palms, MD       risperiDONE (RISPERDAL) tablet 1 mg  1 mg Oral QHS Jordynne Mccown I, NP       Lab Results: No results found for this or any previous visit (from the past 48 hour(s)).  Blood Alcohol level:  Lab Results   Component Value Date   [facility] <10 11/07/2022   ETH <10 35/36/1443   Metabolic Disorder Labs: Lab Results  Component Value Date   HGBA1C 5.8 (H) 11/07/2022   MPG 120 11/07/2022   Lab Results  Component Value Date   PROLACTIN 12.7 11/07/2022   Lab Results  Component Value Date   CHOL 240 (H) 11/07/2022   TRIG 36 11/07/2022   HDL 105 11/07/2022   CHOLHDL 2.3 11/07/2022  VLDL 7 11/07/2022   LDLCALC 128 (H) 11/07/2022   Physical Findings: AIMS: Facial and Oral Movements Muscles of Facial Expression: None, normal Lips and Perioral Area: None, normal Jaw: None, normal Tongue: None, normal,Extremity Movements Upper (arms, wrists, hands, fingers): None, normal Lower (legs, knees, ankles, toes): None, normal, Trunk Movements Neck, shoulders, hips: None, normal, Overall Severity Severity of abnormal movements (highest score from questions above): None, normal Incapacitation due to abnormal movements: None, normal Patient's awareness of abnormal movements (rate only patient's report): No Awareness, Dental Status Current problems with teeth and/or dentures?: No Does patient usually wear dentures?: No  CIWA:    COWS:     Musculoskeletal: Strength & Muscle Tone: within normal limits Gait & Station: normal Patient leans: N/A  Psychiatric Specialty Exam:  Presentation  General Appearance:  Casual; Fairly Groomed  Eye Contact: Minimal  Speech: Clear and Coherent; Normal Rate  Speech Volume: Normal  Handedness: Right  Mood and Affect  Mood: -- (Some improvement noted.)  Affect: Flat  Thought Process  Thought Processes: Coherent; Goal Directed  Descriptions of Associations:Intact  Orientation:Full (Time, Place and Person)  Thought Content:Perseveration; Rumination  History of Schizophrenia/Schizoaffective disorder:Yes  Duration of Psychotic Symptoms:Greater than six months  Hallucinations:Hallucinations: None   Ideas of Reference:None  Suicidal  Thoughts:Suicidal Thoughts: No  Homicidal Thoughts:Homicidal Thoughts: No  Sensorium  Memory: Immediate Fair; Recent Fair; Remote Fair  Judgment: Fair  Insight: Good  Executive Functions  Concentration: Fair  Attention Span: Fair  Recall: [place] of Knowledge: Fair  Language: Good  Psychomotor Activity  Psychomotor Activity: Psychomotor Activity: Normal  Assets  Assets: Communication Skills; Desire for Improvement; Financial Resources/Insurance; Housing; Resilience; Social Support  Sleep  Sleep: Sleep: Good Number of Hours of Sleep: 7.5  Physical Exam: Physical Exam Vitals and nursing note reviewed.  HENT:     Nose: Nose normal.     Mouth/Throat:     Pharynx: Oropharynx is clear.  Eyes:     Pupils: Pupils are equal, round, and reactive to light.  Cardiovascular:     Rate and Rhythm: Normal rate.     Pulses: Normal pulses.  Pulmonary:     Effort: Pulmonary effort is normal.  Genitourinary:    Comments: Deferred Musculoskeletal:        General: Normal range of motion.     Cervical back: Normal range of motion.  Skin:    General: Skin is warm and dry.  Neurological:     General: No focal deficit present.     Mental Status: She is alert and oriented to person, place, and time.   Review of Systems  Constitutional:  Negative for chills, fever and malaise/fatigue.  HENT:  Negative for congestion and sore throat.   Eyes:  Negative for blurred vision.  Respiratory:  Negative for cough, shortness of breath and wheezing.   Cardiovascular:  Negative for chest pain and palpitations.  Gastrointestinal:  Negative for abdominal pain, constipation, diarrhea, heartburn, nausea and vomiting.  Genitourinary:  Negative for dysuria.  Musculoskeletal:  Negative for myalgias.  Skin:  Negative for itching and rash.  Endo/Heme/Allergies:        See allergy lists.   Blood pressure 108/78, pulse 91, temperature 98 F (36.7 C), temperature source Oral, resp.  rate 16, height 5\' 1"  (1.549 m), weight 49.4 kg, last menstrual period 09/08/2012, SpO2 100 %. Body mass index is 20.6 kg/m.  Treatment Plan Summary: Daily contact with patient to assess and evaluate symptoms and progress in treatment and  Medication management.   Continue inpatient hospitalization.  Will continue today 12/19/2022 plan as below except where it is noted.   Principal/active diagnoses.  Undifferentiated schizophrenia ([place])  Plan:  Discontinued Seroquel 50 mg.  -Continue Risperdal 1 mg po Q hs for mood control.  -Continue Mirtazapine 15 mg po Q hs for insomnia/appetite stimulant.  -Continue Hydroxyzine 25 mg po tid prn for anxiety.   Agitation/psychosis:  -Continue Olanzapine-ODT 5 mg po tid prn. & -Ativan 1 mg po daily prn x 1 dose. & Geodon 20 mg IM prn x 1 dose.  Other medical issues. -Continue albuterol inhaler 1-2 puff Q 6 hrs prn for SOB.  -Epi-pen injection 0.3 mg IM prn for anaphylaxis.  -Eucerin cream, apply topically to dry skin areas tid prn.   Other PRNS -Continue Tylenol 650 mg every 6 hours PRN for mild pain -Continue Maalox 30 ml Q 4 hrs PRN for indigestion -Continue MOM 30 ml po Q 6 hrs for constipation.  -Continue Hydrocortisone 0.5 %, apply to affected areas topically for itching. -Continue nonformulary cream, bid prn for eczema.  Safety and Monitoring: Voluntary admission to inpatient psychiatric unit for safety, stabilization and treatment Daily contact with patient to assess and evaluate symptoms and progress in treatment Patient's case to be discussed in multi-disciplinary team meeting Observation Level : q15 minute checks Vital signs: q12 hours Precautions: Safety  Discharge Planning: Social work and case management to assist with discharge planning and identification of hospital follow-up needs prior to discharge Estimated LOS: 5-7 days Discharge Concerns: Need to establish a safety plan; Medication compliance and  effectiveness Discharge Goals: Return home with outpatient referrals for mental health follow-up including medication management/psychotherapy  Lindell Spar, NP, pmhnp, fnp-bc. 12/19/2022, 9:06 AMPatient ID: Weldon Inches, female   DOB: Aug 15, 1966, 57 y.o.   MRN: [id number] Patient ID: Antonela Piland, female   DOB: 1966-02-26, 57 y.o.   MRN: [id number]

## 2022-12-19 NOTE — Group Note (Signed)
LCSW Group Therapy Note   Group Date: 12/19/2022 Start Time: 1100 End Time: 1200   Type of Therapy and Topic:  Group Therapy:  Safe Spaces    Participation Level: Did Not Attend     Description of Group:   In this group patients will be encouraged to explore there safe spaces whether if it is a person, place, or activity. They will be guided to discuss their thoughts, feelings, and behaviors while being in their safe space. The group will process together ways to identify, explore, and analyze positive aspects within their safe space. Each patient will be challenged to identify what is consider a safe space to them. This group will be process-oriented, with patients participating in exploration of their own safe spaces as well as giving and receiving support and challenge from other group members.   Therapeutic Goals: 1.         Patient will identify person, place, and activity for safe space. 2.         Patient will identify what they would bring to there safe space.  3.         Patient will identify feelings, thought process and emotions while being in there safe space. 4.         Patient will identify what there safe place is , if safe space no longer exist.      Summary of Patient Progress Did not attend       Therapeutic Modalities:   Solution Focused Therapy Motivational Interviewing Relapse Therapy    Sherre Lain, LCSWA 12/19/2022  1:35 PM

## 2022-12-19 NOTE — Progress Notes (Signed)
   12/19/22 2115  Psych Admission Type (Psych Patients Only)  Admission Status Involuntary  Psychosocial Assessment  Patient Complaints Sleep disturbance;Suspiciousness  Eye Contact Fair  Facial Expression Flat  Affect Appropriate to circumstance;Preoccupied  Speech Logical/coherent  Interaction Assertive  Motor Activity Slow  Appearance/Hygiene Unremarkable  Behavior Characteristics Cooperative;Appropriate to situation  Mood Preoccupied;Pleasant;Suspicious  Thought Process  Coherency WDL  Content WDL  Delusions None reported or observed  Perception WDL  Hallucination None reported or observed  Judgment Poor  Confusion None  Danger to Self  Current suicidal ideation? Denies

## 2022-12-20 MED ORDER — PNEUMOCOCCAL 20-VAL CONJ VACC 0.5 ML IM SUSY
0.5000 mL | PREFILLED_SYRINGE | INTRAMUSCULAR | Status: DC
  Filled 2022-12-20: qty 0.5

## 2022-12-20 NOTE — Progress Notes (Signed)
   12/20/22 0519  15 Minute Checks  Location Bedroom  Visual Appearance Calm  Behavior Sleeping  Sleep (Behavioral Health Patients Only)  Calculate sleep? (Click Yes once per 24 hr at 0600 safety check) Yes  Documented sleep last 24 hours 8.75

## 2022-12-20 NOTE — Group Note (Signed)
LCSW Group Therapy Note  10/08/2019   10:00-11:00am   Type of Therapy and Topic:  Group Therapy: Anger Analysis Using CBT  Participation Level:  Did Not Attend   Description of Group:   In this group, patients learned how to recognize the physical, cognitive, emotional, and behavioral responses they have to anger-provoking situations.  They identified a recent time they became angry and how they reacted.  They identified the thoughts they had at the time they last were angry, and how this influenced their subsequent actions.  The group then explored how Cognitive Behavioral analysis can help change outcomes.  Therapeutic Goals: Patients will remember his/her last incident of anger and the surrounding circumstances Patients will identify how they felt emotionally and physically, what their thoughts were at the time, and what actions they took Patients will explore possible changes in their feelings and actions if their thoughts had been analyzed and actually found to be inaccurate or unhelpful.  Summary of Patient Progress:  N/A  Therapeutic Modalities:   Cognitive Behavioral Therapy  Cheyene Hamric J Grossman-Orr, MSW, LCSW [phone]      

## 2022-12-20 NOTE — Progress Notes (Signed)
Adult Psychoeducational Group Note  Date:  12/20/2022 Time:  8:35 PM  Group Topic/Focus:  Wrap-Up Group:   The focus of this group is to help patients review their daily goal of treatment and discuss progress on daily workbooks.  Participation Level:  Active  Participation Quality:  Appropriate  Affect:  Appropriate  Cognitive:  Appropriate  Insight: Appropriate  Engagement in Group:  Engaged  Modes of Intervention:  Education and Exploration P Additional Comments:  Patient attended and participated in group tonight. She reports that she learn to have love for yourself. To put yourself first before taking care of others            t  Debe Coder 12/20/2022, 8:35 PM

## 2022-12-20 NOTE — Progress Notes (Signed)
[facility] MD Progress Note  12/20/2022 11:21 AM Brandy Hogan  MRN:  [id number]  Reason for admission: 57 yr old female who presented on 12/30 to [facility] for issues with sleep, she was placed under IVC for delusions and paranoia, she was admitted to [facility] on 1/2. PPHx is significant for Psychosis and 1 Previous Psychiatric Hospitalization [facility] 10/2022), and no history of Suicide Attempts or self Injurious Behavior.   Daily notes:  The patient was seen today in her room along with the medical student.  She was sitting in bed and appeared to be reading and had multiple books spread around her.  She presents as being alert oriented and aware of her situation and also she was responsive to questions she was blunted in her affect and maintained poor eye contact.  She is focused primarily on her sleep and she has in fact slept better although she continues to endorse the fact that she is here mainly for sleep medications.  She has been extremely ambivalent about starting any psychotropic medications and has refused the risperidone again.  She is focused on discharge and wants an exit plan as soon as possible.  She is on an IVC and cannot sign a 72-hour request for discharge.  Family apparently wants her home.  If this is safety plan and she is willing to see an outpatient psychiatrist, the discharge plan could be made long as the family is willing to monitor her very closely.  The patient continues to refuse additional medications and she denies any acute suicidal or homicidal ideations, she may not need this level of care.  Will encourage active participation in groups, encourage compliance.  Will try to liaison with the family and plan on discharge by Monday or earlier.  Principal Problem: Undifferentiated schizophrenia (HCC)  Diagnosis: Principal Problem:   Undifferentiated schizophrenia (HCC)  Total Time spent with patient:  35 minutes  Past Psychiatric History: See H&P.  Past Medical History:   Past Medical History:  Diagnosis Date   Asthma    History of admission to inpatient psychiatry department 12/12/2022   [facility] 10/2022   History reviewed. No pertinent surgical history.  Family History:  Family History  Family history unknown: Yes   Family Psychiatric  History: See H&P  Social History:  Social History   Substance and Sexual Activity  Alcohol Use Never     Social History   Substance and Sexual Activity  Drug Use Never    Social History   Socioeconomic History   Marital status: Married    Spouse name: Not on file   Number of children: Not on file   Years of education: Not on file   Highest education level: Not on file  Occupational History   Not on file  Tobacco Use   Smoking status: Unknown   Smokeless tobacco: Not on file  Substance and Sexual Activity   Alcohol use: Never   Drug use: Never   Sexual activity: Not on file  Other Topics Concern   Not on file  Social History Narrative   Not on file   Social Determinants of Health   Financial Resource Strain: Not on file  Food Insecurity: No Food Insecurity (12/15/2022)   Hunger Vital Sign    Worried About Running Out of Food in the Last Year: Never true    Ran Out of Food in the Last Year: Never true  Transportation Needs: No Transportation Needs (12/15/2022)   PRAPARE - Transportation  Lack of Transportation (Medical): No    Lack of Transportation (Non-Medical): No  Physical Activity: Not on file  Stress: Not on file  Social Connections: Not on file   Additional Social History:   Sleep: Fair  Appetite:  Fair  Current Medications: Current Facility-Administered Medications  Medication Dose Route Frequency Provider Last Rate Last Admin   acetaminophen (TYLENOL) tablet 650 mg  650 mg Oral Q6H PRN Princess Bruins, DO       albuterol (VENTOLIN HFA) 108 (90 Base) MCG/ACT inhaler 1-2 puff  1-2 puff Inhalation Q6H PRN Princess Bruins, DO       alum & mag hydroxide-simeth (MAALOX/MYLANTA)  200-200-20 MG/5ML suspension 30 mL  30 mL Oral Q4H PRN Princess Bruins, DO       diphenhydrAMINE (BENADRYL) capsule 25 mg  25 mg Oral Q8H PRN Armandina Stammer I, NP       EPINEPHrine (EPI-PEN) injection 0.3 mg  0.3 mg Intramuscular PRN Princess Bruins, DO       hydrocerin (EUCERIN) cream   Topical BID Adin Hector, NP   1 Application at 12/20/22 0827   hydrocortisone cream 0.5 %   Topical BID Armandina Stammer I, NP   Given at 12/20/22 0827   hydrOXYzine (ATARAX) tablet 25 mg  25 mg Oral TID PRN Princess Bruins, DO   25 mg at 12/16/22 2055   influenza vaccine (FLUCELVAX - egg free) injection 0.5 mL  0.5 mL Intramuscular Tomorrow-1000 [place], Harrold Donath, MD       loratadine (CLARITIN) tablet 10 mg  10 mg Oral Once Sindy Guadeloupe, NP       OLANZapine zydis (ZYPREXA) disintegrating tablet 5 mg  5 mg Oral Q8H PRN Lauro Franklin, MD       And   LORazepam (ATIVAN) tablet 1 mg  1 mg Oral PRN Lauro Franklin, MD       And   ziprasidone (GEODON) injection 20 mg  20 mg Intramuscular PRN Lauro Franklin, MD       magnesium hydroxide (MILK OF MAGNESIA) suspension 30 mL  30 mL Oral Daily PRN Princess Bruins, DO   30 mL at 12/16/22 1610   mirtazapine (REMERON) tablet 15 mg  15 mg Oral QHS Armandina Stammer I, NP   15 mg at 12/19/22 2115   NONFORMULARY OR COMPOUNDED ITEM 1 Application  1 Application Topical BID PRN Rex Kras, MD       [START ON 12/21/2022] pneumococcal 20-valent conjugate vaccine (PREVNAR 20) injection 0.5 mL  0.5 mL Intramuscular Tomorrow-1000 Rex Kras, MD       risperiDONE (RISPERDAL) tablet 1 mg  1 mg Oral QHS Nwoko, Agnes I, NP       Lab Results: No results found for this or any previous visit (from the past 48 hour(s)).  Blood Alcohol level:  Lab Results  Component Value Date   ETH <10 11/07/2022   ETH <10 10/28/2022   Metabolic Disorder Labs: Lab Results  Component Value Date   HGBA1C 5.8 (H) 11/07/2022   MPG 120 11/07/2022   Lab Results  Component Value Date    PROLACTIN 12.7 11/07/2022   Lab Results  Component Value Date   CHOL 240 (H) 11/07/2022   TRIG 36 11/07/2022   HDL 105 11/07/2022   CHOLHDL 2.3 11/07/2022   VLDL 7 11/07/2022   LDLCALC 128 (H) 11/07/2022   Physical Findings: AIMS: Facial and Oral Movements Muscles of Facial Expression: None, normal Lips and Perioral Area: None, normal Jaw: None, normal Tongue:  None, normal,Extremity Movements Upper (arms, wrists, hands, fingers): None, normal Lower (legs, knees, ankles, toes): None, normal, Trunk Movements Neck, shoulders, hips: None, normal, Overall Severity Severity of abnormal movements (highest score from questions above): None, normal Incapacitation due to abnormal movements: None, normal Patient's awareness of abnormal movements (rate only patient's report): No Awareness, Dental Status Current problems with teeth and/or dentures?: No Does patient usually wear dentures?: No  CIWA:    COWS:     Musculoskeletal: Strength & Muscle Tone: within normal limits Gait & Station: normal Patient leans: N/A  Psychiatric Specialty Exam:  Presentation  General Appearance:  Well Groomed; Appropriate for Environment  Eye Contact: Fleeting  Speech: Slow  Speech Volume: Decreased  Handedness: Right  Mood and Affect  Mood: Anxious; Dysphoric  Affect: Blunt; Restricted  Thought Process  Thought Processes: Linear  Descriptions of Associations:Intact  Orientation:Full (Time, Place and Person)  Thought Content:Obsessions; Perseveration; Rumination  History of Schizophrenia/Schizoaffective disorder:Yes  Duration of Psychotic Symptoms:Greater than six months  Hallucinations:Hallucinations: Auditory Description of Auditory Hallucinations: "Bizzare voices "   Ideas of Reference:Paranoia  Suicidal Thoughts:Suicidal Thoughts: No  Homicidal Thoughts:Homicidal Thoughts: No  Sensorium  Memory: Immediate Fair; Recent Fair; Remote  Fair  Judgment: Poor  Insight: Fair  Chartered certified accountant: Fair  Attention Span: Fair  Recall: [employer] of Knowledge: Fair  Language: Fair  Psychomotor Activity  Psychomotor Activity: Psychomotor Activity: Normal  Assets  Assets: Desire for Improvement; Financial Resources/Insurance; Housing; Social Support; Transportation  Sleep  Sleep: Sleep: Good Number of Hours of Sleep: 8.5  Physical Exam: Physical Exam Vitals and nursing note reviewed.  HENT:     Nose: Nose normal.     Mouth/Throat:     Pharynx: Oropharynx is clear.  Eyes:     Pupils: Pupils are equal, round, and reactive to light.  Cardiovascular:     Rate and Rhythm: Normal rate.     Pulses: Normal pulses.  Pulmonary:     Effort: Pulmonary effort is normal.  Genitourinary:    Comments: Deferred Musculoskeletal:        General: Normal range of motion.     Cervical back: Normal range of motion.  Skin:    General: Skin is warm and dry.  Neurological:     General: No focal deficit present.     Mental Status: She is alert and oriented to person, place, and time.    Review of Systems  Constitutional:  Negative for chills, fever and malaise/fatigue.  HENT:  Negative for congestion and sore throat.   Eyes:  Negative for blurred vision.  Respiratory:  Negative for cough, shortness of breath and wheezing.   Cardiovascular:  Negative for chest pain and palpitations.  Gastrointestinal:  Negative for abdominal pain, constipation, diarrhea, heartburn, nausea and vomiting.  Genitourinary:  Negative for dysuria.  Musculoskeletal:  Negative for myalgias.  Skin:  Negative for itching and rash.  Endo/Heme/Allergies:        See allergy lists.   Blood pressure 109/80, pulse 95, temperature 98.2 F (36.8 C), temperature source Oral, resp. rate 16, height 5\' 1"  (1.549 m), weight 49.4 kg, last menstrual period 09/08/2012, SpO2 99 %. Body mass index is 20.6 kg/m.  Treatment Plan  Summary: Daily contact with patient to assess and evaluate symptoms and progress in treatment and Medication management.   Continue inpatient hospitalization.  Will continue today 12/20/2022 plan as below except where it is noted.   Principal/active diagnoses.  Undifferentiated schizophrenia (HCC)  Plan:  Discontinued  Seroquel 50 mg.  -Continue Risperdal 1 mg po Q hs for mood control.  Encourage compliance.  Patient has been refusing to take this. -Continue Mirtazapine 15 mg po Q hs for insomnia/appetite stimulant.  Patient has taken this last night. -Continue Hydroxyzine 25 mg po tid prn for anxiety.   Agitation/psychosis:  -Continue Olanzapine-ODT 5 mg po tid prn. & -Ativan 1 mg po daily prn x 1 dose. & Geodon 20 mg IM prn x 1 dose.  Other medical issues. -Continue albuterol inhaler 1-2 puff Q 6 hrs prn for SOB.  -Epi-pen injection 0.3 mg IM prn for anaphylaxis.  -Eucerin cream, apply topically to dry skin areas tid prn.   Other PRNS -Continue Tylenol 650 mg every 6 hours PRN for mild pain -Continue Maalox 30 ml Q 4 hrs PRN for indigestion -Continue MOM 30 ml po Q 6 hrs for constipation.  -Continue Hydrocortisone 0.5 %, apply to affected areas topically for itching. -Continue nonformulary cream, bid prn for eczema.  Safety and Monitoring: Involuntary admission to inpatient psychiatric unit for safety, stabilization and treatment Daily contact with patient to assess and evaluate symptoms and progress in treatment Patient's case to be discussed in multi-disciplinary team meeting Observation Level : q15 minute checks Vital signs: q12 hours Precautions: Safety  Discharge Planning: Social work and case management to assist with discharge planning and identification of hospital follow-up needs prior to discharge Estimated LOS: Possible discharge in the next couple of days.  Safety plan is made. Discharge Concerns: Need to establish a safety plan; Medication compliance and  effectiveness Discharge Goals: Return home with outpatient referrals for mental health follow-up including medication management/psychotherapy Total Time Spent in Direct Patient Care:  I personally spent 35 minutes on the unit in direct patient care. The direct patient care time included face-to-face time with the patient, reviewing the patient's chart, communicating with other professionals, and coordinating care. Greater than 50% of this time was spent in counseling or coordinating care with the patient regarding goals of hospitalization, psycho-education, and discharge planning needs.   Brandy Hogan [facility] Psychiatrist   Ranae Palms, MD,  12/20/2022, 11:21 AMPatient ID: Weldon Inches, female   DOB: 18-Nov-1966, 57 y.o.   MRN: [id number] Patient ID: Sherissa Tenenbaum, female   DOB: 03-Oct-1966, 57 y.o.   MRN: [id number]

## 2022-12-20 NOTE — Progress Notes (Signed)
   12/20/22 2134  Psych Admission Type (Psych Patients Only)  Admission Status Involuntary  Psychosocial Assessment  Patient Complaints Depression  Eye Contact Fair  Facial Expression Flat  Affect Appropriate to circumstance  Speech Logical/coherent  Interaction Assertive  Motor Activity Other (Comment) (WDL)  Appearance/Hygiene Unremarkable  Behavior Characteristics Appropriate to situation  Mood Pleasant  Thought Process  Coherency WDL  Content WDL  Delusions None reported or observed  Perception WDL  Hallucination None reported or observed  Judgment Poor  Confusion None  Danger to Self  Current suicidal ideation? Denies  Self-Injurious Behavior No self-injurious ideation or behavior indicators observed or expressed   Agreement Not to Harm Self Yes  Description of Agreement verbal  Danger to Others  Danger to Others None reported or observed

## 2022-12-20 NOTE — Progress Notes (Addendum)
D. Pt presents with a blunted affect/anxious mood and calm, cooperative behavior. Pt denied SI/HI and A/VH this am, and does not appear to be responding to internal stimuli. Pt stated, "I've been practicing replacing negative thoughts about myself with positive thoughts." Pt stayed in room for much of the morning, reading, etc, but observed attending group this afternoon.  A. Labs and vitals monitored.  Pt supported emotionally and encouraged to express concerns and ask questions.   R. Pt remains safe with 15 minute checks. Will continue POC.

## 2022-12-20 NOTE — [facility] Group Notes (Signed)
.  Psychoeducational Group Note    Date:  12/20/2022 Time:1300-1400    Purpose of Group: . The group focus' on teaching patients on how to identify their needs and their Life Skills:  A group where two lists are made. What people need and what are things that we do that are unhealthy. The lists are developed by the patients and it is explained that we often do the actions that are not healthy to get our list of needs met.  Goal:: to develop the coping skills needed to get their needs met  Participation Level:  did not attend Additional Comments:    Jordany Russett A  

## 2022-12-20 NOTE — [facility] Group Notes (Signed)
Goals Group 12/20/22   Group Focus: affirmation, clarity of thought, and goals/reality orientation Treatment Modality:  Psychoeducation Interventions utilized were assignment, group exercise, and support Purpose: To be able to understand and verbalize the reason for their admission to the hospital. To understand that the medication helps with their chemical imbalance but they also need to work on their choices in life. To be challenged to develop a list of 30 positives about themselves. Also introduce the concept that "feelings" are not reality.  Participation Level:  Active  Participation Quality:  Appropriate  Affect:  Appropriate  Cognitive:  Appropriate  Insight:  Improving  Engagement in Group:  Engaged  Additional Comments: Rates energy at a 7/10. Participated in the group.  Brandy Hogan A 

## 2022-12-21 MED ORDER — MIRTAZAPINE 15 MG PO TABS: 15.0000 mg | ORAL_TABLET | Freq: Every day | ORAL | 0 refills | Status: DC

## 2022-12-21 NOTE — Discharge Summary (Signed)
Physician Discharge Summary Note Patient:  Brandy Hogan is an 57 y.o., female MRN:  [id number] DOB:  1966-06-18 Patient phone:  [phone] (home)  Patient address:   [address] [place] [address],  Total Time spent with patient: 45 minutes  Date of Admission:  12/15/2022 Date of Discharge: 12/21/2022  Reason for Admission:  bizarre behavior  Principal Problem: Undifferentiated schizophrenia [facility]) Discharge Diagnoses: Principal Problem:   Undifferentiated schizophrenia [facility])   Past Psychiatric History: See h&p  Past Medical History:  Past Medical History:  Diagnosis Date   Asthma    History of admission to inpatient psychiatry department 12/12/2022   [facility] 10/2022   History reviewed. No pertinent surgical history.  Family History:  See h&p  Social History:  See h&p  Hospital Course:   During the patient's hospitalization, patient had extensive initial psychiatric evaluation, and follow-up psychiatric evaluations every day.  Psychiatric diagnoses provided upon initial assessment: Principal Problem:   Undifferentiated schizophrenia (HCC)   The following medications were managed:  acetaminophen, albuterol, alum & mag hydroxide-simeth, diphenhydrAMINE, EPINEPHrine, hydrOXYzine, OLANZapine zydis **AND** LORazepam **AND** ziprasidone, magnesium hydroxide, NONFORMULARY OR COMPOUNDED ITEM 1 Application  hydrocerin   Topical BID   hydrocortisone cream   Topical BID   influenza vaccine  0.5 mL Intramuscular Tomorrow-1000   loratadine  10 mg Oral Once   mirtazapine  15 mg Oral QHS   pneumococcal 20-valent conjugate vaccine  0.5 mL Intramuscular Tomorrow-1000   risperiDONE  1 mg Oral QHS   Patient was denying Risperdal.  During the hospitalization, patient had the following lab / imaging / testing abnormalities which require further evaluation / management / treatment: none  Patient's care was discussed during the interdisciplinary team meeting  every day during the hospitalization.  The patient denies any side effects to prescribed psychiatric medication.  57 yr old female who presented on 12/30 to [facility] for issues with sleep, she was placed under IVC for delusions and paranoia, she was admitted to [facility] on 1/2.   Gradually, patient started adjusting to milieu. The patient was evaluated each day by a clinical provider to ascertain response to treatment. Improvement was noted by the patient's report of decreasing symptoms, improved sleep and appetite, affect, medication tolerance, behavior, and participation in unit programming.  Patient was asked each day to complete a self inventory noting mood, mental status, pain, new symptoms, anxiety and concerns.    Symptoms were reported as significantly decreased or resolved completely by discharge.   On day of discharge, the patient reports that their mood is stable. The patient denied having suicidal thoughts for more than 48 hours prior to discharge.  Patient denies having homicidal thoughts.  Patient denies having auditory hallucinations.  Patient denies any visual hallucinations or other symptoms of psychosis. The patient was motivated to continue taking medication with a goal of continued improvement in mental health.   The patient reports their target psychiatric symptoms of psychosis responded well to the psychiatric medications, and the patient reports overall benefit other psychiatric hospitalization. Supportive psychotherapy was provided to the patient. The patient also participated in regular group therapy while hospitalized. Coping skills, problem solving as well as relaxation therapies were also part of the unit programming.  Labs were reviewed with the patient, and abnormal results were discussed with the patient.  The patient is able to verbalize their individual safety plan to this provider.  Behavioral Events: none  Restraints: none  # It is recommended to the patient to  continue  psychiatric medications as prescribed, after discharge from the hospital.    # It is recommended to the patient to follow up with your outpatient psychiatric provider and PCP.  # It was discussed with the patient, the impact of alcohol, drugs, tobacco have been there overall psychiatric and medical wellbeing, and total abstinence from substance use was recommended to the patient.  # Prescriptions provided or sent directly to preferred pharmacy at discharge. Patient agreeable to plan. Given opportunity to ask questions. Appears to feel comfortable with discharge.    # In the event of worsening symptoms, the patient is instructed to call the crisis hotline, 911 and or go to the nearest ED for appropriate evaluation and treatment of symptoms. To follow-up with primary care provider for other medical issues, concerns and or health care needs  # Patient was discharged home with a plan to follow up as noted below.  Physical Findings: AIMS: Facial and Oral Movements Muscles of Facial Expression: None, normal Lips and Perioral Area: None, normal Jaw: None, normal Tongue: None, normal,Extremity Movements Upper (arms, wrists, hands, fingers): None, normal Lower (legs, knees, ankles, toes): None, normal, Trunk Movements Neck, shoulders, hips: None, normal, Overall Severity Severity of abnormal movements (highest score from questions above): None, normal Incapacitation due to abnormal movements: None, normal Patient's awareness of abnormal movements (rate only patient's report): No Awareness, Dental Status Current problems with teeth and/or dentures?: No Does patient usually wear dentures?: No  CIWA:    COWS:     Mental Status Exam: General Appearance: appears at stated age, casually dressed and groomed   Behavior: pleasant and cooperative   Psychomotor Activity: no psychomotor agitation or retardation noted   Eye Contact: good  Speech: normal amount, tone, volume and fluency     Mood: euthymic  Affect: congruent, pleasant and interactive   Thought Process: linear, goal directed, no circumstantial or tangential thought process noted, no racing thoughts or flight of ideas  Descriptions of Associations: intact  Thought Content: no bizarre content, logical and future-oriented  Hallucinations: denies AH, VH , does not appear responding to stimuli  Delusions: no paranoia, delusions of control, grandeur, ideas of reference, thought broadcasting, and magical thinking  Suicidal Thoughts: denies SI, intention, plan  Homicidal Thoughts: denies HI, intention, plan   Alertness/Orientation: alert and fully oriented   Insight: fair, improved  Judgment: limited  Memory: intact   Executive Functions  Concentration: intact  Attention Span: fair  Recall: intact  Fund of Knowledge: fair   Physical Exam  Constitutional:      Appearance: Normal appearance.  Cardiovascular:     Rate and Rhythm: Normal rate.  Pulmonary:     Effort: Pulmonary effort is normal.  Neurological:     General: No focal deficit present.     Mental Status: Alert and oriented to person, place, and time.    Review of Systems  Constitutional: Negative.  Negative for chills, fever and weight loss.  HENT: Negative.    Eyes: Negative.   Respiratory: Negative.    Cardiovascular: Negative.   Gastrointestinal:  Negative for constipation, diarrhea, nausea and vomiting.  Genitourinary: Negative.   Musculoskeletal: Negative.   Skin: Negative.   Neurological: Negative.  Negative for tingling.     Blood pressure 107/63, pulse 86, temperature 98.5 F (36.9 C), temperature source Oral, resp. rate 18, height 5\' 1"  (1.549 m), weight 49.4 kg, last menstrual period 09/08/2012, SpO2 99 %. Body mass index is 20.6 kg/m.  Assets  Assets:Desire for Improvement; Financial Resources/Insurance;  Housing; Social Support; Transportation   Social History   Tobacco Use  Smoking Status Unknown  Smokeless  Tobacco Not on file   Tobacco Cessation:  N/A, patient does not currently use tobacco products  Blood Alcohol level:  Lab Results  Component Value Date   ETH <10 11/07/2022   ETH <10 78/24/2353    Metabolic Disorder Labs:  Lab Results  Component Value Date   HGBA1C 5.8 (H) 11/07/2022   MPG 120 11/07/2022   Lab Results  Component Value Date   PROLACTIN 12.7 11/07/2022   Lab Results  Component Value Date   CHOL 240 (H) 11/07/2022   TRIG 36 11/07/2022   HDL 105 11/07/2022   CHOLHDL 2.3 11/07/2022   VLDL 7 11/07/2022   LDLCALC 128 (H) 11/07/2022    Discharge destination: home  Is patient on multiple antipsychotic therapies at discharge:  No   Has Patient had three or more failed trials of antipsychotic monotherapy by history:  No  Recommended Plan for Multiple Antipsychotic Therapies: NA  Discharge Instructions     Diet - low sodium heart healthy   Complete by: As directed    Increase activity slowly   Complete by: As directed       Allergies as of 12/21/2022       Reactions   Eggs Or Egg-derived Products Anaphylaxis   Methylprednisolone Acetate Anaphylaxis   Throat Swelling and Airway Obstruction   Banana Swelling   Eye swelling   Dairycare [lactase-lactobacillus]    Shellfish Allergy    Pineapple Itching   Watermelon [citrullus Vulgaris]    Tongue tingling        Medication List     STOP taking these medications    calcium carbonate 1500 (600 Ca) MG Tabs tablet Commonly known as: OSCAL   cyanocobalamin 1000 MCG tablet   multivitamin with minerals Tabs tablet       TAKE these medications      Indication  albuterol 108 (90 Base) MCG/ACT inhaler Commonly known as: VENTOLIN HFA Inhale into the lungs every 6 (six) hours as needed for wheezing or shortness of breath.  Indication: Spasm of Lung Air Passages   EPINEPHrine 0.3 mg/0.3 mL Soaj injection Commonly known as: EPI-PEN Inject 0.3 mg into the muscle as needed for anaphylaxis.   Indication: Life-Threatening Hypersensitivity Reaction   mirtazapine 15 MG tablet Commonly known as: REMERON Take 1 tablet (15 mg total) by mouth at bedtime.  Indication: Major Depressive Disorder, Insomnia        Follow-up Information     [facility] Follow up on 01/13/2023.   Why: You have an appointment for medication management services on  01/13/23 at 2:00 pm.  This will be a Virtual appointment.  * If you do not have a cell phone to use for the appointment, please call to change to in person. Contact information: [place] [address] [phone]         [facility] Follow up on 12/24/2022.   Why: You have an appointment for therapy services on 12/24/22 at 1:00 pm.  Please call to confirm the details of this appointment.  Please bring your discharge summary from this admission with you. Contact information: [address] [place], [place], [place] [address]  Phone: [phone], [phone] Fax:  [phone]                Discharge recommendations:  Reassess to see if patient needs to be on  an antipsychotic medication. Patient was refusing Risperdal during hospital stay and since she came with psychosis, please consider starting an antipsychotic if patient is presenting with psychosis.  Activity: as tolerated  Diet: heart healthy  # It is recommended to the patient to continue psychiatric medications as prescribed, after discharge from the hospital.     # It is recommended to the patient to follow up with your outpatient psychiatric provider -instructions on appointment date, time, and address (location) are provided to you in discharge paperwork  # Follow-up with outpatient primary care doctor and other specialists -for management of chronic medical disease, including: none  # Testing: Follow-up with outpatient provider for abnormal lab results: none   # It was discussed with the patient, the impact of  alcohol, drugs, tobacco have been there overall psychiatric and medical wellbeing, and total abstinence from substance use was recommended to the patient.   # Prescriptions provided or sent directly to preferred pharmacy at discharge. Patient agreeable to plan. Given opportunity to ask questions. Appears to feel comfortable with discharge.    # In the event of worsening symptoms, the patient is instructed to call the crisis hotline, 911, and or go to the nearest ED for appropriate evaluation and treatment of symptoms. To follow-up with primary care provider for other medical issues, concerns and or health care needs  Patient agrees with D/C instructions and plan.   I discussed my assessment, planned testing and intervention for the patient with Dr. Enedina Finner who agrees with my formulated course of action.   Signed: Lance Muss, MD, PGY-1 12/21/2022, 9:56 AM

## 2022-12-21 NOTE — Progress Notes (Signed)
D. Pt continues to present with a flat affect, exhibits calm, cooperative behavior- reported that she slept 'fair' last night, had a good appetite, good concentration, and normal energy level. Per pt's self inventory, pt rated her depression,hopelessness, and anxiety a 2/0/0, respectively. Pt's stated goal today was "getting a nap." Pt observed attending groups this am. Pt currently denies SI/HI and AVH , and does not appear to be responding to internal stimuli.   A. Labs and vitals monitored.  Pt supported emotionally and encouraged to express concerns and ask questions.   R. Pt remains safe with 15 minute checks. Will continue POC.

## 2022-12-21 NOTE — BHH Group Notes (Signed)
Adult Psychoeducational Group Note Date:  12/21/2022 Time:  0900-1000 Group Topic/Focus: PROGRESSIVE RELAXATION. A group where deep breathing is taught and tensing and relaxation muscle groups is used. Imagery is used as well.  Pts are asked to imagine 3 pillars that hold them up when they are not able to hold themselves up and to share that with the group.   Participation Level:  Active  Participation Quality:  Appropriate  Affect:  Appropriate  Cognitive:  Approprate  Insight: Improving  Engagement in Group:  Engaged  Modes of Intervention:  deep breathing, Imagery. Discussion  Additional Comments:  Pt rates her energy at a 5/10. States her animals, ice cream and music hold her up   : Brandy Hogan

## 2022-12-21 NOTE — [facility] Suicide Risk Assessment (Signed)
Suicide Risk Assessment  Discharge Assessment    [facility] Discharge Suicide Risk Assessment   Principal Problem: Undifferentiated schizophrenia [facility]) Discharge Diagnoses: Principal Problem:   Undifferentiated schizophrenia (HCC)   Total Time spent with patient: 99 minutes  57 yr old female who presented on 12/30 to [facility] for issues with sleep, she was placed under IVC for delusions and paranoia, she was admitted to [facility] on 1/2.   During the patient's hospitalization, patient had extensive initial psychiatric evaluation, and follow-up psychiatric evaluations every day.  Psychiatric diagnoses provided upon initial assessment: undifferentiated schizophrenia  Patient's psychiatric medications were adjusted on admission: -Start Seroquel 50 mg QHS for delusions  During the hospitalization, other adjustments were made to the patient's psychiatric medication regimen:  Discontinued Seroquel 50 mg.  -Continue Risperdal 1 mg po Q hs for mood control.  Encourage compliance.  Patient has been refusing to take this. -Continue Mirtazapine 15 mg po Q hs for insomnia/appetite stimulant.  Patient has taken this last night. -Continue Hydroxyzine 25 mg po tid prn for anxiety.   Patient's care was discussed during the interdisciplinary team meeting every day during the hospitalization.  The patient denied having side effects to prescribed psychiatric medication.  Gradually, patient started adjusting to milieu. The patient was evaluated each day by a clinical provider to ascertain response to treatment. Improvement was noted by the patient's report of decreasing symptoms, improved sleep and appetite, affect, medication tolerance, behavior, and participation in unit programming.  Patient was asked each day to complete a self inventory noting mood, mental status, pain, new symptoms, anxiety and concerns.    Symptoms were reported as significantly decreased or resolved completely by discharge.   On day of  discharge, the patient reports that their mood is stable. The patient denied having suicidal thoughts for more than 48 hours prior to discharge.  Patient denies having homicidal thoughts.  Patient denies having auditory hallucinations.  Patient denies any visual hallucinations or other symptoms of psychosis. The patient was motivated to continue taking medication with a goal of continued improvement in mental health.   The patient reports their target psychiatric symptoms of psychosis responded well to the psychiatric medications, and the patient reports overall benefit other psychiatric hospitalization. Supportive psychotherapy was provided to the patient. The patient also participated in regular group therapy while hospitalized. Coping skills, problem solving as well as relaxation therapies were also part of the unit programming.  Labs were reviewed with the patient, and abnormal results were discussed with the patient.  The patient is able to verbalize their individual safety plan to this provider.  # It is recommended to the patient to continue psychiatric medications as prescribed, after discharge from the hospital.    # It is recommended to the patient to follow up with your outpatient psychiatric provider and PCP.  # It was discussed with the patient, the impact of alcohol, drugs, tobacco have been there overall psychiatric and medical wellbeing, and total abstinence from substance use was recommended the patient.ed.  # Prescriptions provided or sent directly to preferred pharmacy at discharge. Patient agreeable to plan. Given opportunity to ask questions. Appears to feel comfortable with discharge.    # In the event of worsening symptoms, the patient is instructed to call the crisis hotline, 911 and or go to the nearest ED for appropriate evaluation and treatment of symptoms. To follow-up with primary care provider for other medical issues, concerns and or health care needs  # Patient was  discharged home with a  plan to follow up as noted below.    Musculoskeletal: Strength & Muscle Tone: within normal limits Gait & Station: normal Patient leans: N/A  Mental Status Exam: General Appearance: appears at stated age, casually dressed and groomed    Behavior: pleasant and cooperative    Psychomotor Activity: no psychomotor agitation or retardation noted    Eye Contact: good  Speech: normal amount, tone, volume and fluency      Mood: euthymic  Affect: congruent, pleasant and interactive    Thought Process: linear, goal directed, no circumstantial or tangential thought process noted, no racing thoughts or flight of ideas  Descriptions of Associations: intact  Thought Content: no bizarre content, logical and future-oriented  Hallucinations: denies AH, VH , does not appear responding to stimuli  Delusions: no paranoia, delusions of control, grandeur, ideas of reference, thought broadcasting, and magical thinking  Suicidal Thoughts: denies SI, intention, plan  Homicidal Thoughts: denies HI, intention, plan    Alertness/Orientation: alert and fully oriented    Insight: fair, improved  Judgment: limited   Memory: intact    Executive Functions  Concentration: intact  Attention Span: fair  Recall: intact  Fund of Knowledge: fair    Physical Exam  Constitutional:      Appearance: Normal appearance.  Cardiovascular:     Rate and Rhythm: Normal rate.  Pulmonary:     Effort: Pulmonary effort is normal.  Neurological:     General: No focal deficit present.     Mental Status: Alert and oriented to person, place, and time.      Review of Systems  Constitutional: Negative.  Negative for chills, fever and weight loss.  HENT: Negative.    Eyes: Negative.   Respiratory: Negative.    Cardiovascular: Negative.   Gastrointestinal:  Negative for constipation, diarrhea, nausea and vomiting.  Genitourinary: Negative.   Musculoskeletal: Negative.   Skin: Negative.    Neurological: Negative.  Negative for tingling.   Blood pressure 107/63, pulse 86, temperature 98.5 F (36.9 C), temperature source Oral, resp. rate 18, height 5\' 1"  (1.549 m), weight 49.4 kg, last menstrual period 09/08/2012, SpO2 99 %. Body mass index is 20.6 kg/m.  Mental Status Per Nursing Assessment::   On Admission:  NA  Demographic Factors:  NA Loss Factors: NA Historical Factors: NA Risk Reduction Factors:   Positive social support   Continued Clinical Symptoms:  Schizophrenia:   Paranoid or undifferentiated type  Cognitive Features That Contribute To Risk:  Closed-mindedness    Suicide Risk:  Mild: There are no identifiable suicide plans, no associated intent, mild dysphoria and related symptoms, good self-control (both objective and subjective assessment), few other risk factors, and identifiable protective factors, including available and accessible social support.    Follow-up Information     [facility] Follow up on 01/13/2023.   Why: You have an appointment for medication management services on  01/13/23 at 2:00 pm.  This will be a Virtual appointment.  * If you do not have a cell phone to use for the appointment, please call to change to in person. Contact information: [address] [place] [phone]         [facility] Follow up on 12/24/2022.   Why: You have an appointment for therapy services on 12/24/22 at 1:00 pm.  Please call to confirm the details of this appointment.  Please bring your discharge summary from this admission with you. Contact information: [address], [place],  [place] [address]  Phone: [phone], [phone] Fax:  [phone]                Plan Of Care/Follow-up recommendations:  Reassess to see if patient needs to be on an antipsychotic medication. Patient was refusing Risperdal during hospital stay.   Activity: as tolerated  Diet: heart  healthy  Other: -Follow-up with your outpatient psychiatric provider -instructions on appointment date, time, and address (location) are provided to you in discharge paperwork.  -Take your psychiatric medications as prescribed at discharge - instructions are provided to you in the discharge paperwork  -Follow-up with outpatient primary care doctor and other specialists -for management of preventative medicine and chronic medical disease, including: none  -Testing: Follow-up with outpatient provider for abnormal lab results: none  -Recommend abstinence from alcohol, tobacco, and other illicit drug use at discharge.   -If your psychiatric symptoms recur, worsen, or if you have side effects to your psychiatric medications, call your outpatient psychiatric provider, 911, [phone] or go to the nearest emergency department.  -If suicidal thoughts recur, call your outpatient psychiatric provider, 911, 988 or go to the nearest emergency department.     Alesia Morin, MD 12/21/2022, 9:23 AM

## 2022-12-21 NOTE — Progress Notes (Signed)
  [facility] Adult Case Management Discharge Plan :  Will you be returning to the same living situation after discharge:  Yes,  with husband At discharge, do you have transportation home?: Yes,  husband Do you have the ability to pay for your medications: Yes,  has insurance  Release of information consent forms completed and in the chart;  Patient's signature needed at discharge.  Patient to Follow up at:  Follow-up Information     [facility] Follow up on 01/13/2023.   Why: You have an appointment for medication management services on  01/13/23 at 2:00 pm.  This will be a Virtual appointment.  * If you do not have a cell phone to use for the appointment, please call to change to in person. Contact information: [place] [address] [phone]         [facility] Follow up on 12/24/2022.   Why: You have an appointment for therapy services on 12/24/22 at 1:00 pm.  Please call to confirm the details of this appointment.  Please bring your discharge summary from this admission with you. Contact information: [address] [place], [place], [place] [address]  Phone: [phone], [phone] Fax:  [phone]                Next level of care provider has access to [place] and Suicide Prevention discussed: Yes,  [place]     Has patient been referred to the Quitline?: Patient refused referral  Patient has been referred for addiction treatment: Pt. refused referral  Lubertha South, LCSW 12/21/2022, 10:36 AM

## 2022-12-21 NOTE — BHH Group Notes (Signed)
[place] Group Notes:  (Nursing/MHT/Case Management/Adjunct)  Date:  12/21/2022  Time:  8:56 AM  Group Topic/Focus:  Goals Group:   The focus of this group is to help patients establish daily goals to achieve during treatment and discuss how the patient can incorporate goal setting into their daily lives to aide in recovery.    Participation Level:  Active  Participation Quality:  Appropriate  Affect:  Appropriate  Cognitive:  Appropriate  Insight:  Appropriate  Engagement in Group:  Engaged  Modes of Intervention:  Discussion  Summary of Progress/Problems: Patient goal of the day is to get a nap.  Brandy Hogan 12/21/2022, 8:56 AM

## 2022-12-21 NOTE — Progress Notes (Signed)
Pt discharged to lobby where husband is waiting. Pt was stable and appreciative at that time. All papers and prescriptions were given and valuables returned. Verbal understanding expressed. Denies SI/HI and A/VH. Pt given opportunity to express concerns and ask questions.

## 2023-03-25 ENCOUNTER — Other Ambulatory Visit: Payer: Self-pay | Admitting: Internal Medicine

## 2023-03-25 DIAGNOSIS — Z1231 Encounter for screening mammogram for malignant neoplasm of breast: Secondary | ICD-10-CM

## 2023-03-30 ENCOUNTER — Encounter: Payer: Self-pay | Admitting: Diagnostic Neuroimaging

## 2023-03-30 ENCOUNTER — Ambulatory Visit (INDEPENDENT_AMBULATORY_CARE_PROVIDER_SITE_OTHER): Payer: No Typology Code available for payment source | Admitting: Diagnostic Neuroimaging

## 2023-03-30 VITALS — BP 111/69 | HR 59 | Ht 59.0 in | Wt 121.0 lb

## 2023-03-30 DIAGNOSIS — R410 Disorientation, unspecified: Secondary | ICD-10-CM | POA: Diagnosis not present

## 2023-03-30 NOTE — Patient Instructions (Signed)
  TRANSIENT CONFUSION, ENCEPHALOPATHY, PARANOIA (Nov 2023 - Jan 2024; now greatly improved; essentially resolved) - likely related to prior insomnia, anxiety, stress reaction - now resolved; no need for additional testing at this time
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
## 2023-05-04 ENCOUNTER — Ambulatory Visit
Admission: RE | Admit: 2023-05-04 | Discharge: 2023-05-04 | Disposition: A | Discharge status: Home / Self Care | Payer: No Typology Code available for payment source | Source: Ambulatory Visit | Attending: Internal Medicine | Admitting: Internal Medicine

## 2023-05-04 DIAGNOSIS — Z1231 Encounter for screening mammogram for malignant neoplasm of breast: Secondary | ICD-10-CM

## 2023-10-28 ENCOUNTER — Ambulatory Visit (HOSPITAL_COMMUNITY)
Admission: EM | Admit: 2023-10-28 | Discharge: 2023-10-28 | Disposition: A | Discharge status: Home / Self Care | Payer: No Typology Code available for payment source | Attending: Registered Nurse | Admitting: Registered Nurse

## 2023-10-28 ENCOUNTER — Encounter (HOSPITAL_COMMUNITY): Payer: Self-pay | Admitting: Registered Nurse

## 2023-10-28 DIAGNOSIS — F203 Undifferentiated schizophrenia: Secondary | ICD-10-CM

## 2023-10-28 MED ORDER — QUETIAPINE FUMARATE 50 MG PO TABS: 50.0000 mg | ORAL_TABLET | Freq: Every day | ORAL | 0 refills | Status: DC

## 2023-10-28 NOTE — Progress Notes (Signed)
   10/28/23 1052  [facility] Triage Screening (Walk-ins at [facility] only)  How Did You Hear About [place]? Family/Friend  What Is the Reason for Your Visit/Call Today? Sallie Rossiter is a 57 year old female presenting to [facility] accompanied by her husband. Pt reports she is having intrusive thoughts, inconsistent sleep patterns, and depressed mood. Pt reports she has had thoughts of "wanting to die" but would never act upon them. Pt is currently not taking any medication for anxiety and depression. Pt reports she is currently seeing a therapist. However, she has only had one session. Pt is interested in trying some medication fo her ongoing intrusive thoughts and inconsistent sleep patterns. Pt denies substance use, SI, HI and AVH currently.  How Long Has This Been Causing You Problems? 1-6 months  Have You Recently Had Any Thoughts About Hurting Yourself? Yes  How long ago did you have thoughts about hurting yourself? last night  Are You Planning to Commit Suicide/Harm Yourself At This time? No  Have you Recently Had Thoughts About Hurting Someone [name]? No  Are You Planning To Harm Someone At This Time? No  Are you currently experiencing any auditory, visual or other hallucinations? No  Have You Used Any Alcohol or Drugs in the Past 24 Hours? No  Do you have any current medical co-morbidities that require immediate attention? No  Clinician description of patient physical appearance/behavior: calm, cooperative  What Do You Feel Would Help You the Most Today? Medication(s);Treatment for Depression or other mood problem  If access to [facility] was not available, would you have sought care in the Emergency Department? No  Determination of Need Routine (7 days)  Options For Referral Intensive Outpatient Therapy

## 2023-10-28 NOTE — ED Notes (Signed)
Patient dc by provider

## 2023-10-28 NOTE — Discharge Instructions (Addendum)
You should be expecting a call to establish an appointment for Saturday during their open access for medication management.  If you do not receive a call by the end of the day please call to get appointment.            Services Children & Teens Adults Peer Support Outreach & Education [facility]  [address] [place], [place] [address] Phone: [phone]  [facility] [address] [place], [place] [address] Phone: [phone]      Same-day appointments Operating hours and clinician availability may delay appointments until the next business day. [facility] and Current Patients  Psychiatry hours Monday-Friday, 8am-4:30pm (Eastern Time) If you are experiencing problems accessing [facility] send email to: [email or web address] or call [phone], Monday-Friday, 8:00am-4:30pm If you are having a psychiatric or medical emergency, please call 911 or go to the nearest emergency department. To reach the Suicide and Crisis Lifeline, please call or text [phone].  What is [facility]?  [facility] is an online service that provides same-day access to psychiatry to meet urgent mental health needs. The goal is to provide patients with timely intervention and keep them in an outpatient setting.  How long will I wait to see a clinician?  Our goal is to provide same-day care. However, operating hours and clinician availability may delay appointments until the next business day.  What if I can't get an appointment?  Please call [facility] at [phone] 8am to 5pm [place] Time or email [place]:  [email or web address]  to request the next available time. If you are having a psychiatric or medical emergency, please call 911 or go to the nearest emergency department. To reach the Suicide and Crisis Lifeline, please call or text [phone]. Do you accept insurance?  Yes. We accept most commercial insurance plans. What  information do you need from me? New patients should be ready to provide:  Photo ID  Insurance card  Payment information  For current patients, our specialists will confirm your documentation is on file.   Can I continue with regular care after my [facility] session?  Yes. Our goal is to make sure you have the follow-up care you need. Our specialist can help you schedule an appointment with an ongoing provider in addition to your On Demand session.  Can my current clinician provide treatment on [facility]?  No. Our [facility] clinicians are trained to assist with more immediate needs and provide support between regular appointments with your clinician.  What device can I use to connect with [facility]?  You can use any Wi-Fi-enabled device with a camera and a microphone, such as a smartphone, tablet, or computer.  Do I have to be at home to connect with [facility]?  No. As long as you are located within [place] or [place] you can connect with a provider. We do request that you connect from a safe and private location. Your clinician is required to document your location for emergency purposes.  Insurance:  Ulla Gallo, [place], [employer], [place], [place], [place], [place], [employer], [employer]   [employer] [phone] [address], [address] [place], [place], [address] [phone] phone Completely online treatment platform Contact: [place] - [employer] Outreach Specialist [phone] phone [phone] fax

## 2023-10-28 NOTE — ED Provider Notes (Signed)
Behavioral Health Urgent Care Medical Screening Exam  Patient Name: Brandy Hogan MRN: [id number] Date of Evaluation: 10/28/23 Chief Complaint:   Diagnosis:  Final diagnoses:  Undifferentiated schizophrenia (HCC)    History of Present illness: Brandy Hogan is a 57 y.o. female patient presented to [facility] as a walk in accompanied by her husband with complaints of intrusive thoughts, depression, and inconsistent sleep  Brandy Hogan, 57 y.o., female patient seen face to face by this provider, chart reviewed, and consulted with Dr. Nelly Rout on 10/28/23.  On evaluation Brandy Hogan reports she is wanting to get set up with outpatient psychiatric services for medication management.  She states worsening depression, intrusive thoughts "I just have constant intrusive thoughts when I'm trying to sleep.  Bad thoughts about everything and everybody."  She states she is also having suicidal thoughts but "I don't want to kill myself.  I wouldn't do anything like that but sometimes I wish I could just go to sleep and not wake up."  Patient denies auditory/visual hallucinations.  She states she has been to see a neurologist but referred and never follow up with another neurologist.  Patient states "When I was here the last time I was given a little brown pill that helped be sleep and I haven't been able to find it since.  Informed that the pill was either Remeron or Trazodone.  She states it wasn't Trazodone.  Informed that the Remeron my make intrusive thoughts worse and suggested Seroquel at bed time that would help with depression, sleep, and intrusive thoughts.   During evaluation Brandy Hogan is seated in exam room accompanied by her husband.  She is dressed appropriate for weather.  There is no noted distress.  Patient is alert/oriented x 4, calm, cooperative, attentive, and responses were relevant and appropriate to assessment questions.  She spoke in a clear tone at  decreased volume, and normal pace, with minimal eye contact.   She denies active suicidal and is able to contract for safety.  Husband doesn't feel that patient is a danger to herself.  Patient also denies homicidal ideation, psychosis, and paranoia.  Objectively there is no evidence of psychosis/mania or delusional thinking.  She conversed coherently, with goal directed thoughts, no distractibility, or pre-occupation.  At this time Brandy Hogan is educated and verbalizes understanding of mental health resources and other crisis services in the community. She is instructed to call 911 and present to the nearest emergency room should she experience any suicidal/homicidal ideation, auditory/visual/hallucinations, or detrimental worsening of her mental health condition.  She was a also advised by Clinical research associate that she could call the toll-free phone on back of  insurance card to assist with identifying counselors and agencies in network.   Flowsheet Row ED from 10/28/2023 in [facility] (Discharged[facility] from 12/12/2022 in [facility]  C-SSRS RISK CATEGORY No Risk No Risk No Risk       Psychiatric Specialty Exam  Presentation  General Appearance:Appropriate for Environment  Eye Contact:Minimal  Speech:Clear and Coherent; Normal Rate  Speech Volume:Normal  Handedness:Right   Mood and Affect  Mood: Anxious; Dysphoric  Affect: Flat   Thought Process  Thought Processes: Coherent; Goal Directed  Descriptions of Associations:Intact  Orientation:Full (Time, Place and Person)  Thought Content:Logical  Diagnosis of Schizophrenia or Schizoaffective disorder in past: Yes  Duration of Psychotic Symptoms: Greater than six months  Hallucinations:None "Bizzare voices "  Ideas of Reference:None  Suicidal Thoughts:Yes, Passive Without Intent; Without  Plan  Homicidal Thoughts:No   Sensorium  Memory: Immediate Good; Recent Good  Judgment: Intact  Insight: Fair   Chartered certified accountant: Fair  Attention Span: Fair  Recall: Brandy Hogan of Knowledge: Good  Language: Good   Psychomotor Activity  Psychomotor Activity: Normal   Assets  Assets: Communication Skills; Desire for Improvement; Financial Resources/Insurance; Housing; Leisure Time; Social Support   Sleep  Sleep: Fair  Number of hours:  8.5   Physical Exam: Physical Exam Vitals and nursing note reviewed. Exam conducted with a chaperone present (Husband present).  Constitutional:      General: She is not in acute distress.    Appearance: Normal appearance. She is not ill-appearing.  HENT:     Head: Normocephalic.  Cardiovascular:     Rate and Rhythm: Normal rate.  Pulmonary:     Effort: Pulmonary effort is normal. No respiratory distress.  Musculoskeletal:        General: Normal range of motion.     Cervical back: Normal range of motion.  Skin:    General: Skin is warm and dry.  Neurological:     Mental Status: She is alert and oriented to person, place, and time.  Psychiatric:        Attention and Perception: Attention and perception normal. She does not perceive auditory or visual hallucinations.        Mood and Affect: Mood is anxious and depressed.        Speech: Speech normal.        Behavior: Behavior normal. Behavior is cooperative.        Thought Content: Thought content is not paranoid or delusional. Thought content does not include homicidal ideation. Suicidal: Passive thoughts with no plan or intent.       Judgment: Judgment normal.     Comments: Reporting intrusive thoughts   Review of Systems  Constitutional:        No other complaints voiced  Psychiatric/Behavioral:  Positive for depression and hallucinations. Suicidal ideas: Passive, no intent or plan.       Patient states she isn't sleeping well related  to intrusive thoughts.  Patient states she has had some passive suicidal thoughts like not wanting to wake but doesn't want to die, no plan or intent and able to contract for safety   All other systems reviewed and are negative.  Blood pressure 119/81, pulse 78, temperature 97.9 F (36.6 C), temperature source Oral, resp. rate 19, last menstrual period 09/08/2012, SpO2 100%. There is no height or weight on file to calculate BMI.  Musculoskeletal: Strength & Muscle Tone: within normal limits Gait & Station: normal Patient leans: N/A   [facility] MSE Discharge Disposition for Follow up and Recommendations: Based on my evaluation the patient does not appear to have an emergency medical condition and can be discharged with resources and follow up care in outpatient services for Medication Management, Partial Hospitalization Program, and Individual Therapy   Treana Lacour, NP 10/28/2023, 12:38 PM
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
## 2023-11-07 ENCOUNTER — Telehealth (HOSPITAL_BASED_OUTPATIENT_CLINIC_OR_DEPARTMENT_OTHER): Payer: No Typology Code available for payment source | Admitting: Psychiatry

## 2023-11-07 ENCOUNTER — Telehealth (HOSPITAL_COMMUNITY): Payer: Self-pay | Admitting: Psychiatry

## 2023-11-07 ENCOUNTER — Encounter (HOSPITAL_COMMUNITY): Payer: Self-pay | Admitting: Psychiatry

## 2023-11-07 DIAGNOSIS — F419 Anxiety disorder, unspecified: Secondary | ICD-10-CM | POA: Diagnosis not present

## 2023-11-07 DIAGNOSIS — F322 Major depressive disorder, single episode, severe without psychotic features: Secondary | ICD-10-CM | POA: Diagnosis not present

## 2023-11-07 DIAGNOSIS — F29 Unspecified psychosis not due to a substance or known physiological condition: Secondary | ICD-10-CM | POA: Diagnosis not present

## 2023-11-07 DIAGNOSIS — F203 Undifferentiated schizophrenia: Secondary | ICD-10-CM

## 2023-11-07 MED ORDER — QUETIAPINE FUMARATE 100 MG PO TABS: 100.0000 mg | ORAL_TABLET | Freq: Every day | ORAL | 1 refills | Status: DC

## 2023-11-07 NOTE — Telephone Encounter (Signed)
Called to schedule follow up from today's Saturday clinic visit. No answer and voicemail box is full. Sending MyChart message.

## 2023-11-07 NOTE — Patient Instructions (Signed)
Thank you for attending your appointment today.  -- INCREASE Seroquel to 100 mg nightly  -- If concerned about increasing too quickly, you may increase to 75 mg for the first week and then 100 mg thereafter -- Continue other medications as prescribed.  Please do not make any changes to medications without first discussing with your provider. If you are experiencing a psychiatric emergency, please call 911 or present to your nearest emergency department. Additional crisis, medication management, and therapy resources are included below.  [facility]  [address] [place], [place] [address] [phone] WALK-IN URGENT CARE 24/7 FOR ANYONE [address] [place], [place]  [phone] Fax: [phone] [email or web address] *Interpreters available *Accepts all insurance and uninsured for Urgent Care needs *Accepts Medicaid and uninsured for outpatient treatment (below)      ONLY FOR [facility]  Below:    Outpatient New Patient Assessment/Therapy Walk-ins:        Monday, Wednesday, and Thursday 8am until slots are full (first come, first served)                   New Patient Psychiatry/Medication Management        Monday-Friday 8am-11am (first come, first served)               For all walk-ins we ask that you arrive by 7:15am, because patients will be seen in the order of arrival.

## 2023-11-09 ENCOUNTER — Telehealth (HOSPITAL_COMMUNITY): Payer: Self-pay

## 2023-11-09 NOTE — Telephone Encounter (Signed)
Patient increased her Seroquel after her visit on Saturday. She states that it gave her racing thoughts and she had bad dream. She has gone back to the original dose 50mg . I will have patient make a follow up appointment before she leaves here today. Please review and advise, thank you

## 2023-11-13 ENCOUNTER — Telehealth (HOSPITAL_BASED_OUTPATIENT_CLINIC_OR_DEPARTMENT_OTHER): Payer: No Typology Code available for payment source | Admitting: Family

## 2023-11-13 DIAGNOSIS — F322 Major depressive disorder, single episode, severe without psychotic features: Secondary | ICD-10-CM

## 2023-11-13 DIAGNOSIS — F203 Undifferentiated schizophrenia: Secondary | ICD-10-CM | POA: Diagnosis not present

## 2023-11-13 MED ORDER — OLANZAPINE 2.5 MG PO TABS: 2.5000 mg | ORAL_TABLET | Freq: Every day | ORAL | 2 refills | Status: DC

## 2023-11-13 NOTE — Progress Notes (Unsigned)
Virtual Visit via Video Note  I connected with Brandy Hogan on 11/14/23 at 11:00 AM EST by a video enabled telemedicine application and verified that I am speaking with the correct person using two identifiers.  Location: Patient: Home Provider: Home Office   I discussed the limitations of evaluation and management by telemedicine and the availability of in person appointments. The patient expressed understanding and agreed to proceed.   I discussed the assessment and treatment plan with the patient. The patient was provided an opportunity to ask questions and all were answered. The patient agreed with the plan and demonstrated an understanding of the instructions.   The patient was advised to call back or seek an in-person evaluation if the symptoms worsen or if the condition fails to improve as anticipated.  I provided 00 minutes of non-face-to-face time during this encounter.   Brandy Rack, NP   [facility] MD/PA/NP OP Progress Note  11/14/2023 8:16 AM Brandy Hogan  MRN:  [id number]  Chief Complaint: Brandy Hogan stated " I tried to take Seroquel 100 mg, but I think it made things worse."   Brandy Hogan 57 year old African-American female presents for medication management follow-up appointment.  Patient was initially seen and evaluated Saturday clinic by Dr.Barini.  Patient has a charted diagnosis with undifferentiated schizophrenia, major depressive disorder and generalized anxiety disorder.  She was initiated on Seroquel 50 mg-100mg  nightly.  Patient reports she has not taken medications as directed.  States her symptoms appear to be worsening.  She continues to endorse negative thoughts. "  Feeling bad about myself negative self talk."  Patient alludes to auditory hallucinations however states " the thoughts are loud and worse with taking Seroquel."  Stated that she has taken Remeron in the past to help with sleep however states she does not feel that medication is  helping.  Denied paranoia ideations.  She does admit to mild depression at this time.  Patient is reluctant to initiating any antidepressants.  Discussed changing Seroquel from 100 mg to Zyprexa 2.5 mg nightly with consideration for titration.  Brandy Hogan does admit traumatic event that happened in early part of last year however, states she did not want to relive or disclose any symptoms related to her traumatic event.  Presents flat,guarded minimal reactivity with questions. (Minimal facial expression/ reactions noted mask like)   She reports she has a initial MRI appointment February 2025.  Reports a poor appetite however states she is not interested in taking any medications to help.  Stated "I eat here and there" states she was able to get 6 hours of sleep on last night.  Undifferentiated schizophrenia Major depressive disorder Generalized anxiety disorder  Patient self discontinued Seroquel 100 mg nightly Will initiate Zyprexa 2.5 mg nightly as needed patient to follow-up 1 week for medication adherence/tolerability.  Consideration low-dose SSRI.  During evaluation Brandy Hogan is sitting at dinner room table,  she is alert/oriented x 3; calm/cooperative; and mood congruent with affect.  Patient is speaking in a clear tone at moderate volume, and normal pace; with fair to minimal eye contact. Her thought process is coherent and relevant; There is no indication that he is currently responding to internal/external stimuli or experiencing delusional thought content.  Patient denies suicidal/self-harm/homicidal ideation, psychosis. Patient is guarded when asked about paranoia. " What do you mean, sometimes, maybe I don't understand what your asking."   Patient has remained calm throughout assessment and has answered questions appropriately.    Visit Diagnosis:  ICD-10-CM   1. MDD (major depressive disorder), severe (HCC)  F32.2     2. Undifferentiated schizophrenia (HCC)  F20.3        Past Psychiatric History:   Past Medical History:  Past Medical History:  Diagnosis Date   Asthma    History of admission to inpatient psychiatry department 12/12/2022   [facility] 10/2022   Psychosis [facility])    No past surgical history on file.  Family Psychiatric History:   Family History:  Family History  Problem Relation Age of Onset   Schizophrenia Mother    Bipolar disorder Sister     Social History:  Social History   Socioeconomic History   Marital status: Married    Spouse name: Not on file   Number of children: Not on file   Years of education: Not on file   Highest education level: Not on file  Occupational History   Not on file  Tobacco Use   Smoking status: Never   Smokeless tobacco: Never  Substance and Sexual Activity   Alcohol use: Never   Drug use: Never   Sexual activity: Not on file  Other Topics Concern   Not on file  Social History Narrative   Not on file   Social Determinants of Health   Financial Resource Strain: Not on file  Food Insecurity: No Food Insecurity (12/15/2022)   Hunger Vital Sign    Worried About Running Out of Food in the Last Year: Never true    Ran Out of Food in the Last Year: Never true  Transportation Needs: No Transportation Needs (12/15/2022)   PRAPARE - Administrator, Civil Service (Medical): No    Lack of Transportation (Non-Medical): No  Physical Activity: Not on file  Stress: Not on file  Social Connections: Not on file    Allergies:  Allergies  Allergen Reactions   Egg-Derived Products Anaphylaxis   Methylprednisolone Acetate Anaphylaxis    Throat Swelling and Airway Obstruction   Banana Swelling    Eye swelling   Dairycare [Bacid]    Shellfish Allergy    Pineapple Itching   Watermelon [Citrullus Vulgaris]     Tongue tingling    Metabolic Disorder Labs: Lab Results  Component Value Date   HGBA1C 5.8 (H) 11/07/2022   MPG 120 11/07/2022   Lab Results  Component Value Date    PROLACTIN 12.7 11/07/2022   Lab Results  Component Value Date   CHOL 240 (H) 11/07/2022   TRIG 36 11/07/2022   HDL 105 11/07/2022   CHOLHDL 2.3 11/07/2022   VLDL 7 11/07/2022   LDLCALC 128 (H) 11/07/2022   Lab Results  Component Value Date   TSH 3.326 12/12/2022   TSH 4.152 11/07/2022    Therapeutic Level Labs: No results found for: "LITHIUM" No results found for: "VALPROATE" No results found for: "CBMZ"  Current Medications: Current Outpatient Medications  Medication Sig Dispense Refill   OLANZapine (ZYPREXA) 2.5 MG tablet Take 1 tablet (2.5 mg total) by mouth at bedtime. 30 tablet 2   albuterol (VENTOLIN HFA) 108 (90 Base) MCG/ACT inhaler Inhale into the lungs every 6 (six) hours as needed for wheezing or shortness of breath.     calcium carbonate (SUPER CALCIUM) 1500 (600 Ca) MG TABS tablet Take 600 mg of elemental calcium by mouth daily with breakfast.     Cholecalciferol 50 MCG (2000 UT) TABS 500 Units by Orogastric route daily. (Patient not taking: Reported on 11/07/2023)     cyanocobalamin (VITAMIN  B12) 1000 MCG tablet Take 1,000 mcg by mouth 2 (two) times daily.     EPINEPHrine 0.3 mg/0.3 mL IJ SOAJ injection Inject 0.3 mg into the muscle as needed for anaphylaxis.     No current facility-administered medications for this visit.     Musculoskeletal: Tele assessment   Psychiatric Specialty Exam: Review of Systems  Psychiatric/Behavioral:  Positive for decreased concentration and sleep disturbance.   All other systems reviewed and are negative.   Last menstrual period 09/08/2012.There is no height or weight on file to calculate BMI.  General Appearance: Casual, flat, mask-like  Eye Contact:  Fair, looking away and downward  Speech:  Clear and Coherent  Volume:  Normal  Mood:  Anxious and Depressed  Affect:  Blunt and Flat  Thought Process:  Coherent  Orientation:  Full (Time, Place, and Person)  Thought Content: Logical   Suicidal Thoughts:  No  Homicidal  Thoughts:  No  Memory:  Immediate;   Fair  Judgement:  Fair  Insight:  Lacking  Psychomotor Activity:  NA  Concentration:  Concentration: Fair  Recall:  Good  Fund of Knowledge: Good  Language: Good  Akathisia:  No  Handed:  Right  AIMS (if indicated): not done  Assets:  Communication Skills Desire for Improvement  ADL's:  Intact  Cognition: reported concerns related to memory   Sleep:  Fair   Screenings: AIMS    Flowsheet Row [facility]  AIMS Total Score 0      PHQ2-9    Flowsheet Row [facility]  PHQ-2 Total Score 5  PHQ-9 Total Score 20      Flowsheet Row Video Visit from 11/07/2023 [facility] from 10/28/2023 in [facility] Admission (Discharged) [facility]  C-SSRS RISK CATEGORY Low Risk No Risk No Risk        Assessment and Plan: Brandy Hogan 57 year old African-American female presents for medication management appointment.  Per initial assessment patient was prescribed Seroquel 100 mg nightly however patient states she has not been taking medications as indicated.  States she is unable to tolerate medication at higher dose.  States she is interested in changing medications altogether.  She reports Seroquel made her negative thoughts worse.  Denied paranoia, suicidal or homicidal ideations.  Discussed initiating Zyprexa 2.5 mg nightly with plans to titrate to 5 mg at subsequent visits.  Discussed initiating SSRI however patient declined at this time stating " I do not want to take too many medications."  Patient reports follow-up with Neurology February/ 2025 -Discussed partial hospitalization programming to assist with therapy and negative self thoughts patient declined  Collaboration of Care:  Collaboration of Care: Medication Management AEB Discontinued Seroquel, patient to start Zyprexa 2.5 mg nightly    Patient/Guardian was advised Release of Information must be obtained prior to any record release in order to collaborate their care with an outside provider. Patient/Guardian was advised if they have not already done so to contact the registration department to sign all necessary forms in order for [place] to release information regarding their care.   Consent: Patient/Guardian gives verbal consent for treatment and assignment of benefits for services provided during this visit. Patient/Guardian expressed understanding and agreed to proceed.    Brandy Rack, NP 11/14/2023, 8:16 AM

## 2023-11-14 ENCOUNTER — Encounter (HOSPITAL_COMMUNITY): Payer: Self-pay | Admitting: Family

## 2023-11-17 ENCOUNTER — Encounter (HOSPITAL_COMMUNITY): Payer: Self-pay | Admitting: Family

## 2023-11-17 ENCOUNTER — Ambulatory Visit (HOSPITAL_BASED_OUTPATIENT_CLINIC_OR_DEPARTMENT_OTHER): Payer: No Typology Code available for payment source | Admitting: Family

## 2023-11-17 ENCOUNTER — Other Ambulatory Visit: Payer: Self-pay

## 2023-11-17 VITALS — Ht 59.75 in | Wt 108.0 lb

## 2023-11-17 DIAGNOSIS — F322 Major depressive disorder, single episode, severe without psychotic features: Secondary | ICD-10-CM

## 2023-11-17 DIAGNOSIS — F203 Undifferentiated schizophrenia: Secondary | ICD-10-CM | POA: Diagnosis not present

## 2023-11-17 NOTE — Progress Notes (Signed)
[facility] MD/PA/NP OP Progress Note  11/17/2023 2:15 PM Brandy Hogan  MRN:  [id number]  Chief Complaint: Brandy Hogan stated " I am still not sleeping."  HPI: Brandy Hogan seen and evaluated face-to-face.  Patient is accompanied by her spouse.  She provided verbal authorization for patient to stay during this assessment.  She continues to endorse sleep disturbance.  She reports following a over-the-counter herbal supplement to assist with her reported sleep issues.  States " this medication is for  more serotonin".  Education provided with FDA approved medication versus prescription medications.  She continues to have apprehension with taking medications at this time.  She was initiated on Seroquel 50 mg nightly which she states she has been unable to tolerate.  She reports that Seroquel makes her negative thoughts worse.  As she continues to have issues with sleep.  At subsequent follow-up visit this provider initiated Zyprexa 2.5 mg nightly, however patient reports the medication is not helping.  States she would like to be put on a medication when she was initially admitted prior to going to [facility].  Chart review patient was prescribed Invega and trazodone.  This provider was receptive to initiating both medications however patient declined at this time.  Plan is for patient to take Zyprexa 5 mg nightly.  Will make hydroxyzine 10 mg p.o. 3 times daily as needed available as needed.  States apprehension with continue to follow-up with psychiatry services due to financial issues and medications for wrong diagnoses.  States unsure that the schizophrenia or paranoia diagnosis is wrong.   Undifferentiated schizophrenia: Sleep disturbance:  Patient is amendable to trying Zyprexa  2.5 mg - 5 mg nightly  Patient observed mumbling to herself.  She presents flat, irritable and guarded.  Minimal eye contact slightly more reactive than on initial assessment.  Discussed following up with psychiatry services if  medication is not beneficial.  Patient declined.  Provided with [facility] urgent care contact information and/or Saturday morning clinic if space available.  Schedule a follow-up appointment 3 months.  As patient reports she would like to follow-up with neurology prior to next office visit.  Support, encouragement and reassurance was provided.    Visit Diagnosis:    ICD-10-CM   1. 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 MDD (major depressive disorder), severe (HCC)  F32.2       Past Psychiatric History:   Past Medical History:  Past Medical History:  Diagnosis Date   Asthma    History of admission to inpatient psychiatry department 12/12/2022   [facility] 10/2022   Psychosis [facility])    History reviewed. No pertinent surgical history.  Family Psychiatric History:   Family History:  Family History  Problem Relation Age of Onset   Schizophrenia Mother    Bipolar disorder Sister     Social History:  Social History   Socioeconomic History   Marital status: Married    Spouse name: Not on file   Number of children: Not on file   Years of education: Not on file   Highest education level: Not on file  Occupational History   Not on file  Tobacco Use   Smoking status: Never   Smokeless tobacco: Never  Substance and Sexual Activity   Alcohol use: Never   Drug use: Never   Sexual activity: Not on file  Other Topics Concern   Not on file  Social History Narrative   Not on file   Social Determinants of Health  Financial Resource Strain: Not on file  Food Insecurity: No Food Insecurity (12/15/2022)   Hunger Vital Sign    Worried About Running Out of Food in the Last Year: Never true    Ran Out of Food in the Last Year: Never true  Transportation Needs: No Transportation Needs (12/15/2022)   PRAPARE - Administrator, Civil Service (Medical): No    Lack of Transportation (Non-Medical): No  Physical Activity: Not on file  Stress: Not on file   Social Connections: Not on file    Allergies:  Allergies  Allergen Reactions   Egg-Derived Products Anaphylaxis   Methylprednisolone Acetate Anaphylaxis    Throat Swelling and Airway Obstruction   Banana Swelling    Eye swelling   Dairycare [Bacid]    Shellfish Allergy    Pineapple Itching   Watermelon [Citrullus Vulgaris]     Tongue tingling    Metabolic Disorder Labs: Lab Results  Component Value Date   HGBA1C 5.8 (H) 11/07/2022   MPG 120 11/07/2022   Lab Results  Component Value Date   PROLACTIN 12.7 11/07/2022   Lab Results  Component Value Date   CHOL 240 (H) 11/07/2022   TRIG 36 11/07/2022   HDL 105 11/07/2022   CHOLHDL 2.3 11/07/2022   VLDL 7 11/07/2022   LDLCALC 128 (H) 11/07/2022   Lab Results  Component Value Date   TSH 3.326 12/12/2022   TSH 4.152 11/07/2022    Therapeutic Level Labs: No results found for: "LITHIUM" No results found for: "VALPROATE" No results found for: "CBMZ"  Current Medications: Current Outpatient Medications  Medication Sig Dispense Refill   5-Hydroxytryptophan (5-HTP) 100 MG CAPS Take 1 capsule by mouth daily. To help you sleep     albuterol (VENTOLIN HFA) 108 (90 Base) MCG/ACT inhaler Inhale into the lungs every 6 (six) hours as needed for wheezing or shortness of breath.     calcium carbonate (SUPER CALCIUM) 1500 (600 Ca) MG TABS tablet Take 600 mg of elemental calcium by mouth daily with breakfast.     Cholecalciferol 50 MCG (2000 UT) TABS 500 Units by Orogastric route daily.     cyanocobalamin (VITAMIN B12) 1000 MCG tablet Take 1,000 mcg by mouth 2 (two) times daily.     EPINEPHrine 0.3 mg/0.3 mL IJ SOAJ injection Inject 0.3 mg into the muscle as needed for anaphylaxis.     OLANZapine (ZYPREXA) 2.5 MG tablet Take 1 tablet (2.5 mg total) by mouth at bedtime. 30 tablet 2   No current facility-administered medications for this visit.     Musculoskeletal: Strength & Muscle Tone: within normal limits Gait & Station:  normal Patient leans: N/A  Psychiatric Specialty Exam: Review of Systems  Psychiatric/Behavioral:  Positive for agitation and sleep disturbance. Negative for suicidal ideas. The patient is nervous/anxious.   All other systems reviewed and are negative.   Height 4' 11.75" (1.518 m), weight 108 lb (49 kg), last menstrual period 09/08/2012.Body mass index is 21.27 kg/m.  General Appearance: Casual  Eye Contact:  Minimal  Speech:  Clear and Coherent  Volume:  Normal  Mood:  Anxious and Depressed  Affect:  Depressed and Flat  Thought Process:  Coherent and Linear mild thought blocking or slow to process/ responded question at time  Orientation:  Full (Time, Place, and Person)  Thought Content: Paranoid Ideation   Suicidal Thoughts:  No  Homicidal Thoughts:  No  Memory:  Immediate;   Fair Recent;   Fair  Judgement:  Fair  Insight:  Fair  Psychomotor Activity:  Normal  Concentration:  Concentration: Fair  Recall:  [employer] of Knowledge: Fair  Language: Fair  Akathisia:  No  Handed:  Right  AIMS (if indicated): not done  Assets:  Communication Skills Desire for Improvement  ADL's:  Intact  Cognition: WNL  Sleep:  Poor   Screenings: AIMS    Flowsheet Row [facility]  AIMS Total Score 0      PHQ2-9    Flowsheet Row Video Visit from [facility]07/2023 [facility]  PHQ-2 Total Score 5  PHQ-9 Total Score 20      Flowsheet Row Video Visit from 11/07/2023 [facility] from 10/28/2023 in [facility] Admission (Discharged) [facility]  C-SSRS RISK CATEGORY Low Risk No Risk No Risk        Assessment and Plan: Deboran Mckinney is a 57 year old African-American female presents for medication management appointment.  Patient was recently  discontinued from Seroquel 50 mg due to reported adverse reactions.  She reports ongoing negative thoughts and sleep disturbance.  Discussed starting Seroquel 2.5 mg nightly patient appeared amendable to medication adjustment however during this visit she states medications is not helping.  Patient has taken the medication 1 night.  Discussed increasing Seroquel 2.5 mg to 5 mg nightly.  Continues to present with hesitancy.  Patient reports buying over-the-counter herbal supplement to help with memory, sleep and depression.  Education was provided with FDA approved medications.  Patient was provided with telephonic follow-up and or Saturday clinic she declined citing financial concerns regarding multiple follow-up appointments.    She reports follow-up appointment with neurology February/2025.  Discussed following up after neurology appointment.  She was receptive to plan.  Support, encouragement and reassurance was provided.  Collaboration of Care: Collaboration of Care: Other keep outpatient follow-up appointment with neurology  Patient/Guardian was advised Release of Information must be obtained prior to any record release in order to collaborate their care with an outside provider. Patient/Guardian was advised if they have not already done so to contact the registration department to sign all necessary forms in order for [place] to release information regarding their care.   Consent: Patient/Guardian gives verbal consent for treatment and assignment of benefits for services provided during this visit. Patient/Guardian expressed understanding and agreed to proceed.    Oneta Rack, NP 11/17/2023, 2:15 PM

## 2023-11-18 ENCOUNTER — Other Ambulatory Visit (HOSPITAL_COMMUNITY): Payer: Self-pay

## 2023-11-18 MED ORDER — HYDROXYZINE PAMOATE 25 MG PO CAPS: 25.0000 mg | ORAL_CAPSULE | Freq: Every day | ORAL | 0 refills | Status: DC

## 2023-12-14 ENCOUNTER — Ambulatory Visit (HOSPITAL_COMMUNITY)
Admission: EM | Admit: 2023-12-14 | Discharge: 2023-12-14 | Disposition: A | Discharge status: Home / Self Care | Payer: No Typology Code available for payment source | Attending: Psychiatry | Admitting: Psychiatry

## 2023-12-14 DIAGNOSIS — F203 Undifferentiated schizophrenia: Secondary | ICD-10-CM | POA: Diagnosis not present

## 2023-12-14 MED ORDER — OLANZAPINE 2.5 MG PO TABS: 5.0000 mg | ORAL_TABLET | Freq: Every day | ORAL | Status: DC

## 2023-12-14 NOTE — Progress Notes (Signed)
   12/14/23 1549  [facility] Triage Screening (Walk-ins at [facility] only)  How Did You Hear About [place]? Family/Friend  What Is the Reason for Your Visit/Call Today? Brandy Hogan is a 57 year old female presenting to [facility] accompanied by her husband. Pts husband reports that she is not sleeping well. Pt mentions that Dr. Melvyn Neth prescribed her medication to help her sleep. However, pts husband reports that it is not stopping her intrusive thoughts. Pts husband is looking for his wife to be put on a new medication. Pts husband reports that this has been going on for 1.5 months. Pt was non verbal today, up until triage. Pt denies substance use, Si, Hi and Avh.  How Long Has This Been Causing You Problems? 1 wk - 1 month  Have You Recently Had Any Thoughts About Hurting Yourself? No  Are You Planning to Commit Suicide/Harm Yourself At This time? No  Have you Recently Had Thoughts About Hurting Someone [name]? No  Are You Planning To Harm Someone At This Time? No  Physical Abuse Denies  Verbal Abuse Denies  Sexual Abuse Denies  Exploitation of patient/patient's resources Denies  Self-Neglect Denies  Possible abuse reported to: Other (Comment)  Are you currently experiencing any auditory, visual or other hallucinations? No  Have You Used Any Alcohol or Drugs in the Past 24 Hours? No  Do you have any current medical co-morbidities that require immediate attention? No  Clinician description of patient physical appearance/behavior: non verbal (off and on), calm  What Do You Feel Would Help You the Most Today? Medication(s)  If access to [facility] was not available, would you have sought care in the Emergency Department? No  Determination of Need Routine (7 days)  Options For Referral Intensive Outpatient Therapy;Medication Management

## 2023-12-14 NOTE — Discharge Instructions (Addendum)
Discharge recommendations:   Medications: Patient is to take medications as prescribed. The patient or patient's guardian is to contact a medical professional and/or outpatient provider to address any new side effects that develop. The patient or the patient's guardian should update outpatient providers of any new medications and/or medication changes.    Outpatient Follow up: Please review list of outpatient resources for psychiatry and counseling. Please follow up with your primary care provider for all medical related needs.    Therapy: We recommend that patient participate in individual therapy to address mental health concerns.   Atypical antipsychotics: If you are prescribed an atypical antipsychotic, it is recommended that your height, weight, BMI, blood pressure, fasting lipid panel, and fasting blood sugar be monitored by your outpatient providers.  Safety:   The following safety precautions should be taken:   No sharp objects. This includes scissors, razors, scrapers, and putty knives.   Chemicals should be removed and locked up.   Medications should be removed and locked up.   Weapons should be removed and locked up. This includes firearms, knives and instruments that can be used to cause injury.   The patient should abstain from use of illicit substances/drugs and abuse of any medications.  If symptoms worsen or do not continue to improve or if the patient becomes actively suicidal or homicidal then it is recommended that the patient return to the closest hospital emergency department, the [facility], or call 911 for further evaluation and treatment. National Suicide Prevention Lifeline 1-800-SUICIDE or (864)860-0916.  About 988 988 offers 24/7 access to trained crisis counselors who can help people experiencing mental health-related distress. People can call or text 988 or chat 988lifeline.org for themselves or if they are worried about a  loved one who may need crisis support.   [facility] [address] [place], [place], [address] [phone] phone (Medicare, Private insurance except T[employer]are, [employer] [place], and [facility])

## 2023-12-15 ENCOUNTER — Other Ambulatory Visit: Payer: Self-pay

## 2023-12-15 ENCOUNTER — Ambulatory Visit (HOSPITAL_COMMUNITY)
Admission: EM | Admit: 2023-12-15 | Discharge: 2023-12-15 | Disposition: A | Discharge status: Other Acute Inpatient Hospital | Payer: No Typology Code available for payment source | Attending: Psychiatry | Admitting: Psychiatry

## 2023-12-15 DIAGNOSIS — F23 Brief psychotic disorder: Secondary | ICD-10-CM | POA: Diagnosis not present

## 2023-12-15 DIAGNOSIS — F203 Undifferentiated schizophrenia: Secondary | ICD-10-CM | POA: Insufficient documentation

## 2023-12-15 DIAGNOSIS — R4589 Other symptoms and signs involving emotional state: Secondary | ICD-10-CM | POA: Insufficient documentation

## 2023-12-15 DIAGNOSIS — Z79899 Other long term (current) drug therapy: Secondary | ICD-10-CM | POA: Diagnosis not present

## 2023-12-15 DIAGNOSIS — Z91148 Patient's other noncompliance with medication regimen for other reason: Secondary | ICD-10-CM | POA: Insufficient documentation

## 2023-12-15 LAB — CBC WITH DIFFERENTIAL/PLATELET
Abs Immature Granulocytes: 0.01 10*3/uL (ref 0.00–0.07)
Basophils Absolute: 0 10*3/uL (ref 0.0–0.1)
Basophils Relative: 1 %
Eosinophils Absolute: 0 10*3/uL (ref 0.0–0.5)
Eosinophils Relative: 0 %
HCT: 45.4 % (ref 36.0–46.0)
Hemoglobin: 15 g/dL (ref 12.0–15.0)
Immature Granulocytes: 0 %
Lymphocytes Relative: 22 %
Lymphs Abs: 1.1 10*3/uL (ref 0.7–4.0)
MCH: 30 pg (ref 26.0–34.0)
MCHC: 33 g/dL (ref 30.0–36.0)
MCV: 90.8 fL (ref 80.0–100.0)
Monocytes Absolute: 0.3 10*3/uL (ref 0.1–1.0)
Monocytes Relative: 7 %
Neutro Abs: 3.5 10*3/uL (ref 1.7–7.7)
Neutrophils Relative %: 70 %
Platelets: 361 10*3/uL (ref 150–400)
RBC: 5 MIL/uL (ref 3.87–5.11)
RDW: 16.9 % — ABNORMAL HIGH (ref 11.5–15.5)
WBC: 4.9 10*3/uL (ref 4.0–10.5)
nRBC: 0 % (ref 0.0–0.2)

## 2023-12-15 LAB — URINALYSIS, COMPLETE (UACMP) WITH MICROSCOPIC
Bilirubin Urine: NEGATIVE
Glucose, UA: NEGATIVE mg/dL
Hgb urine dipstick: NEGATIVE
Ketones, ur: 80 mg/dL — AB
Nitrite: NEGATIVE
Protein, ur: 30 mg/dL — AB
Specific Gravity, Urine: 1.027 (ref 1.005–1.030)
pH: 5 (ref 5.0–8.0)

## 2023-12-15 LAB — POCT URINE DRUG SCREEN - MANUAL ENTRY (I-SCREEN)
POC Amphetamine UR: NOT DETECTED
POC Buprenorphine (BUP): NOT DETECTED
POC Cocaine UR: NOT DETECTED
POC Marijuana UR: NOT DETECTED
POC Methadone UR: NOT DETECTED
POC Methamphetamine UR: NOT DETECTED
POC Morphine: NOT DETECTED
POC Oxazepam (BZO): NOT DETECTED
POC Oxycodone UR: NOT DETECTED
POC Secobarbital (BAR): NOT DETECTED

## 2023-12-15 LAB — ETHANOL: Alcohol, Ethyl (B): <10 mg/dL (ref <10)

## 2023-12-15 LAB — HEMOGLOBIN A1C
Hgb A1c MFr Bld: 5.9 % — ABNORMAL HIGH (ref 4.8–5.6)
Mean Plasma Glucose: 123 mg/dL

## 2023-12-15 LAB — COMPREHENSIVE METABOLIC PANEL
ALT: 24 U/L (ref 0–44)
AST: 24 U/L (ref 15–41)
Albumin: 4.7 g/dL (ref 3.5–5.0)
Alkaline Phosphatase: 53 U/L (ref 38–126)
Anion gap: 12 (ref 5–15)
BUN: 18 mg/dL (ref 6–20)
CO2: 26 mmol/L (ref 22–32)
Calcium: 10.5 mg/dL — ABNORMAL HIGH (ref 8.9–10.3)
Chloride: 100 mmol/L (ref 98–111)
Creatinine, Ser: 1.12 mg/dL — ABNORMAL HIGH (ref 0.44–1.00)
GFR, Estimated: 58 mL/min — ABNORMAL LOW (ref >60)
Glucose, Bld: 119 mg/dL — ABNORMAL HIGH (ref 70–99)
Potassium: 4.2 mmol/L (ref 3.5–5.1)
Sodium: 138 mmol/L (ref 135–145)
Total Bilirubin: 0.9 mg/dL (ref 0.0–1.2)
Total Protein: 8.6 g/dL — ABNORMAL HIGH (ref 6.5–8.1)

## 2023-12-15 LAB — MAGNESIUM: Magnesium: 2.2 mg/dL (ref 1.7–2.4)

## 2023-12-15 LAB — LIPID PANEL
Cholesterol: 296 mg/dL — ABNORMAL HIGH (ref 0–200)
HDL: 113 mg/dL (ref >40)
LDL Cholesterol: 172 mg/dL — ABNORMAL HIGH (ref 0–99)
Total CHOL/HDL Ratio: 2.6 {ratio}
Triglycerides: 54 mg/dL (ref <150)
VLDL: 11 mg/dL (ref 0–40)

## 2023-12-15 LAB — POC URINE PREG, ED: Preg Test, Ur: NEGATIVE

## 2023-12-15 LAB — POCT PREGNANCY, URINE: Preg Test, Ur: NEGATIVE

## 2023-12-15 LAB — TSH: TSH: 1.151 u[IU]/mL (ref 0.350–4.500)

## 2023-12-15 MED ORDER — MAGNESIUM HYDROXIDE 400 MG/5ML PO SUSP: 30.0000 mL | Freq: Every day | ORAL | Status: DC | PRN

## 2023-12-15 MED ORDER — ALUM & MAG HYDROXIDE-SIMETH 200-200-20 MG/5ML PO SUSP: 30.0000 mL | ORAL | Status: DC | PRN

## 2023-12-15 MED ORDER — TRAZODONE HCL 50 MG PO TABS: 50.0000 mg | ORAL_TABLET | Freq: Every evening | ORAL | Status: DC | PRN

## 2023-12-15 MED ORDER — ACETAMINOPHEN 325 MG PO TABS: 650.0000 mg | ORAL_TABLET | Freq: Four times a day (QID) | ORAL | Status: DC | PRN

## 2023-12-15 MED ORDER — HYDROXYZINE HCL 25 MG PO TABS: 25.0000 mg | ORAL_TABLET | Freq: Three times a day (TID) | ORAL | Status: DC | PRN

## 2023-12-15 NOTE — ED Notes (Signed)
Report has been called to [employer], Charity fundraiser at [facility].

## 2023-12-15 NOTE — ED Provider Notes (Signed)
 Behavioral Health Urgent Care Medical Screening Exam  Patient Name: Brandy Hogan MRN: [id number] Date of Evaluation: 12/15/23 Chief Complaint:  Patient unable to sleep Diagnosis:  Final diagnoses:  Undifferentiated schizophrenia (HCC)    History of Present illness: Brandy Hogan is a 57 y.o. female patient presented to [facility] as a walk in  accompanied by her spouse Brandy Hogan with complaints of unable to sleep  Brandy Hogan, 57 y.o., female patient seen face to face by this provider, consulted with Dr. Larina; and chart reviewed on 12/15/23.  Per chart review patient has a past psychiatric history of undifferentiated schizophrenia and MDD she currently has services in place with  health outpatient, Staci Kerns, NP.  Patient is currently prescribed Zyprexa  5 mg nightly but is not compliant with medication.  Of note patient presented to [facility] on 12/14/2023 with similar complaint.  Patient was offered inpatient psychiatric admission at that time but declined and was discharged home with her spouse.  Patient presents today to [facility] with similar complaints of being unable to sleep.  Spouse states patient did take 5 mg of Zyprexa  last night and a 25 mg hydroxyzine .  She was only able to sleep 1-2 hours.  She woke up in the middle of the night fearful thinking that something bad was going to happen to her spouse states that she appears paranoid. States patient is unable to form sentences and has declined since presenting to [facility] on 12/14/2023.  During evaluation Brandy Hogan is observed sitting in the assessment room in no acute distress.  She is alert/oriented x 4 and cooperative.  She is easily distracted and inattentive.  Her speech is mumbled and difficult to understand at time, with a decreased tone. she makes no eye contact.  She is withdrawn and guarded.  She has difficulty answering many of the assessment questions.  She is disorganized and has  thought blocking.  She was only able to sleep 1-2 hours last night.  Spouse states patient has eaten very little and has lost weight.  States patient is not at her baseline.  She has a flat affect.  She denies SI/HI/AVH.  She is having intrusive thoughts of feeling that something very bad is going to happen.  She appears to be responding to internal/external stimuli at times throughout the assessment.  Discussed inpatient psychiatric admission and patient is agreeable.    Flowsheet Row ED from 12/15/2023 in [facility] ED from 12/14/2023 in [facility] Video Visit from [facility]07[facility]  C-SSRS RISK CATEGORY No Risk No Risk Low Risk       Psychiatric Specialty Exam  Presentation  General Appearance:Bizarre  Eye Contact:Minimal  Speech:Clear and Coherent; Slow  Speech Volume:Decreased  Handedness:Right   Mood and Affect  Mood: Dysphoric  Affect: Flat   Thought Process  Thought Processes: Disorganized  Descriptions of Associations:Intact  Orientation:Full (Time, Place and Person)  Thought Content:Paranoid Ideation  Diagnosis of Schizophrenia or Schizoaffective disorder in past: Yes  Duration of Psychotic Symptoms: Greater than six months  Hallucinations:None Bizzare voices   Ideas of Reference:None  Suicidal Thoughts:No Without Intent; Without Plan  Homicidal Thoughts:No   Sensorium  Memory: Recent Poor; Immediate Poor; Remote Poor  Judgment: Poor  Insight: Poor   Executive Functions  Concentration: Poor  Attention Span: Poor  Recall: Poor  Fund of Knowledge: Poor  Language: Fair   Psychomotor Activity  Psychomotor Activity: Normal   Assets  Assets: Intimacy; Leisure Time; Physical Health; Resilience   Sleep  Sleep: Poor  Number of hours:  2   Physical Exam: Physical Exam Vitals and nursing note reviewed.   Constitutional:      General: She is not in acute distress.    Appearance: Normal appearance. She is not ill-appearing.  Eyes:     General:        Right eye: No discharge.        Left eye: No discharge.  Cardiovascular:     Rate and Rhythm: Normal rate.  Pulmonary:     Effort: Pulmonary effort is normal. No respiratory distress.  Musculoskeletal:        General: Normal range of motion.     Cervical back: Normal range of motion.  Skin:    Coloration: Skin is not jaundiced or pale.  Neurological:     Mental Status: She is alert and oriented to person, place, and time.  Psychiatric:        Attention and Perception: She is inattentive.        Mood and Affect: Affect is flat.        Speech: She is noncommunicative (at times).        Behavior: Behavior is withdrawn.        Thought Content: Thought content is paranoid.        Cognition and Memory: Cognition normal.        Judgment: Judgment normal.    Review of Systems  Constitutional: Negative.  Negative for chills and fever.  HENT: Negative.  Negative for hearing loss.   Eyes: Negative.   Respiratory: Negative.  Negative for shortness of breath.   Cardiovascular: Negative.  Negative for chest pain.  Musculoskeletal: Negative.   Skin: Negative.   Neurological: Negative.   Psychiatric/Behavioral:  The patient has insomnia.    Blood pressure 121/73, pulse (!) 102, temperature 97.8 F (36.6 C), temperature source Oral, resp. rate 17, last menstrual period 09/08/2012, SpO2 100%. There is no height or weight on file to calculate BMI.  Musculoskeletal: Strength & Muscle Tone: within normal limits Gait & Station: normal Patient leans: N/A   [facility] MSE Discharge Disposition for Follow up and Recommendations: Based on my evaluation I certify that psychiatric inpatient services furnished can reasonably be expected to improve the patient's condition which I recommend transfer to an appropriate accepting facility.   Patient meets  criteria for inpatient psychiatric admission.  [facility] notified and there is no bed availability.  Patient has been accepted to [facility] and can arrive today  Lab Orders         CBC with Differential/Platelet         Comprehensive metabolic panel         Hemoglobin A1c         Magnesium          Ethanol         Lipid panel         TSH         Urinalysis, Complete w Microscopic -Urine, Clean Catch         POC urine preg, ED         POCT Urine Drug Screen - (I-Screen)         Pregnancy, urine POC      EKG   Elveria VEAR Batter, NP 12/15/2023, 11:22 AM

## 2023-12-15 NOTE — Progress Notes (Signed)
 LCSW Progress Note  [id number]   Evelene Roussin [facility]  12/15/2023  10:31 AM  Description:   Inpatient Psychiatric Referral  Patient was recommended inpatient per Elveria Batter NP. There are no available beds at [facility], per [facility] Cherylynn Ernst RN. Patient was referred to the following out of network facilities:    [facility] Provider Address Phone Fax  [facility] [address] [place] [address] [phone]  [facility] [address] [place] [address] [phone]  [facility] [address], [place] [address] [phone]  [facility] [address] [place] [address] [phone]  [facility] [address] [place], [place] [address] [phone]  [facility] [address] [place] [address] [phone]  [facility] [address] [place] [address] [phone]  [facility] [address], [place] [address] [phone]  [facility] [address] [place] [address] [phone]  [facility] [address] [place] [address] [phone]  [facility] [address] [place] [address] [phone]  [facility] EFAX [address] [place], [place] [phone]  [facility] [address] [place] [address] [phone]  [facility] [address], [place] [address] [phone]  [facility] [address] [place] [address] [phone]  [facility] [address], [place] [address] [phone]  [facility] [address] [place] [address] [phone]      Situation ongoing, CSW to continue following and update chart as more information becomes available.     Tunisia Belkys Henault, MSW, LCSW  12/15/2023 10:31 AM

## 2023-12-15 NOTE — ED Notes (Signed)
Patient d/c to [facility] with all belongings via Safe Transport in stable condition. Patient denies SI, HI, AVH.

## 2023-12-15 NOTE — ED Notes (Signed)
Old Onnie Graham is reviewing chart

## 2023-12-15 NOTE — Discharge Instructions (Signed)
 Pt has been accepted to [facility] on 12/15/2023 Bed assignment: [facility]   Pt meets inpatient criteria per: Pierrette NP  Attending Physician will be: Dr, Althia Marker MD   Report can be called to: [phone]  Pt can arrive anytime today
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
## 2023-12-15 NOTE — Progress Notes (Signed)
 Pt has been accepted to [facility] on 12/15/2023 Bed assignment: [facility]   Pt meets inpatient criteria per: Nash Batter NP  Attending Physician will be: Dr, Althia Marker MD   Report can be called to: [phone]  Pt can arrive anytime today  Care Team Notified: Elveria Batter NP, April Batter [facility] LPN    T[place] Arrietty Dercole LCSW-A   12/15/2023 10:48 AM

## 2023-12-15 NOTE — Progress Notes (Signed)
   12/15/23 0744  [facility] Triage Screening (Walk-ins at [facility] only)  How Did You Hear About Us ? Family/Friend  What Is the Reason for Your Visit/Call Today? Pt presents to [facility] accompanied by her husband. Pt was just seen here on yesterday. Per chart review, pt's husband reported that the pt is not sleeping well. Pt states that she took the presecribed medication but only got a couple hours of sleep. Pt denies SI, HI, AVH and alcohol/drug use at this present time.  How Long Has This Been Causing You Problems? <Week  Have You Recently Had Any Thoughts About Hurting Yourself? No  Are You Planning to Commit Suicide/Harm Yourself At This time? No  Have you Recently Had Thoughts About Hurting Someone [name]? No  Are You Planning To Harm Someone At This Time? No  Physical Abuse Denies  Verbal Abuse Denies  Sexual Abuse Denies  Exploitation of patient/patient's resources Denies  Self-Neglect Denies  Are you currently experiencing any auditory, visual or other hallucinations? No  Have You Used Any Alcohol or Drugs in the Past 24 Hours? No  Do you have any current medical co-morbidities that require immediate attention? No  Clinician description of patient physical appearance/behavior: soft spoken, cakm, cooperative  What Do You Feel Would Help You the Most Today? Social Support;Treatment for Depression or other mood problem;Medication(s)  If access to [facility] was not available, would you have sought care in the Emergency Department? No  Determination of Need Routine (7 days)  Options For Referral Medication Management;Inpatient Hospitalization;Outpatient Therapy

## 2023-12-15 NOTE — ED Notes (Addendum)
 Patient is A&O x 1 calm, cooperative with brief eye contact, a flat pensive facial expression and flat, preoccupied, depressed affect. Patients speech is blocked, pressured and soft. Patient has difficulty expressing self and finding words to explain her thoughts. Patients presents with anhedonia, preoccupation, depressed and labile mood. Patient offered and accepted breakfast.

## 2023-12-15 NOTE — ED Notes (Signed)
 Received a call from [place] at [facility] to inquires about patients behaviors for possible admission. Patient's demographics verified. Information provided. Patient has been accepted to [facility].

## 2023-12-29 ENCOUNTER — Telehealth (HOSPITAL_COMMUNITY): Payer: Self-pay | Admitting: Professional

## 2023-12-29 ENCOUNTER — Other Ambulatory Visit (HOSPITAL_COMMUNITY): Payer: No Typology Code available for payment source | Attending: Psychiatry | Admitting: Professional

## 2023-12-29 DIAGNOSIS — F203 Undifferentiated schizophrenia: Secondary | ICD-10-CM

## 2023-12-29 NOTE — Psych (Signed)
 Virtual Visit via Telephone Note  I connected with Brandy Hogan on 12/29/23 at 10:00 AM EST by telephone and verified that I am speaking with the correct person using two identifiers.  Location: Patient: home Provider: Clinical Home Provider   I discussed the limitations, risks, security and privacy concerns of performing an evaluation and management service by telephone and the availability of in person appointments. I also discussed with the patient that there may be a patient responsible charge related to this service. The patient expressed understanding and agreed to proceed.   Follow Up Instructions:    I discussed the assessment and treatment plan with the patient. The patient was provided an opportunity to ask questions and all were answered. The patient agreed with the plan and demonstrated an understanding of the instructions.   The patient was advised to call back or seek an in-person evaluation if the symptoms worsen or if the condition fails to improve as anticipated.  I provided 10 minutes of non-face-to-face time during this encounter.   Brandy Hogan, LCMHC  Cln called Brandy Hogan to f/u due to Brandy Hogan not being in virtual apt for [facility] for PHP. Cln orients and Brandy Hogan reports she did not know about the apt. She asks about future apts and cln provides information for Neurology apt coming up on 01/25/24 and med man apt with Staci Kerns on 02/08/24. Brandy Hogan reports understanding. She reports she wants an individual provider and reached out to one she knows of but they do not accept her insurance and she is unable to pay out of pocket. Cln offers to email referrals for [place] to contact for individual providers and Brandy Hogan agrees. Cln also explains Brandy Hogan can contact her insurance company for more referrals. She reports understanding. Brandy Hogan asks this cln about some bills she has received and cln explains she would need to call [facility] or her insurance company for information on  billing. Hali reports understanding. She denies SI/HI. Cln encourages Brandy Hogan to reach out to [facility] in the future if she changes her mind.

## 2024-02-03 ENCOUNTER — Telehealth (HOSPITAL_COMMUNITY): Payer: Self-pay | Admitting: Family

## 2024-02-03 ENCOUNTER — Telehealth (HOSPITAL_COMMUNITY): Payer: No Typology Code available for payment source | Admitting: Family

## 2024-02-03 DIAGNOSIS — F203 Undifferentiated schizophrenia: Secondary | ICD-10-CM

## 2024-02-03 DIAGNOSIS — F322 Major depressive disorder, single episode, severe without psychotic features: Secondary | ICD-10-CM

## 2024-02-03 MED ORDER — OLANZAPINE 5 MG PO TABS: 5.0000 mg | ORAL_TABLET | Freq: Every day | ORAL | 2 refills | Status: DC

## 2024-02-03 MED ORDER — PRAZOSIN HCL 1 MG PO CAPS
1.0000 mg | ORAL_CAPSULE | Freq: Every day | ORAL | 1 refills | Status: DC
Start: 2024-02-03 — End: 2024-07-22

## 2024-02-03 NOTE — Progress Notes (Unsigned)
Virtual Visit via Video Note  I connected with Brandy Hogan on 02/04/24 at  7:30 AM EST by a video enabled telemedicine application and verified that I am speaking with the correct person using two identifiers.  Location: Patient: Home  Provider:  Home Office    I discussed the limitations of evaluation and management by telemedicine and the availability of in person appointments. The patient expressed understanding and agreed to proceed.   I discussed the assessment and treatment plan with the patient. The patient was provided an opportunity to ask questions and all were answered. The patient agreed with the plan and demonstrated an understanding of the instructions.   The patient was advised to call back or seek an in-person evaluation if the symptoms worsen or if the condition fails to improve as anticipated.  I provided 15 minutes of non-face-to-face time during this encounter.   Brandy Rack, NP   [facility] MD/PA/NP OP Progress Note  02/04/2024 9:48 AM Brandy Hogan  MRN:  [id number]  Chief Complaint: Medication management  [id number]  Browser 58 year old presents for medication management follow-up appointment.  Patient was recently discharged from [facility] for mood stabilization.  She carries a diagnosis with unspecified schizophrenia.  On previous assessments patient has been resistant to documented diagnoses.  Thus causing her to be noncompliant with medication. Brandy Hogan  was seen and evaluated via virtual assessment.  She presents with a brighter affect.  She reports she was prescribed 2.5 mg of olanzapine however feels that she could benefit from 5 mg as she has been taking 5 mg since she was discharged.  Reports she was also prescribed prazosin 1 mg and hydroxyzine.  Brandy Hogan reports she does have a follow-up with neurology on the 20th.  States they continue to run test and she is looking for clarity related to her autoimmune disorder which could explain most  of her symptoms.  She continues to endorse negative thoughts " negative self talk."  Denies auditory or visual hallucinations.  She reports a good appetite.  States she is resting well however has had 1 night where she had to take melatonin and hydroxyzine in order to get some rest.  Education provided with following up immediately if unable to rest more than 2 consecutive nights.  she was receptive to plan.   Visit Diagnosis:    ICD-10-CM   1. 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 MDD (major depressive disorder), severe (HCC)  F32.2       Past Psychiatric History:   Past Medical History:  Past Medical History:  Diagnosis Date   Asthma    History of admission to inpatient psychiatry department 12/12/2022   [facility] 10/2022   Psychosis [facility])    No past surgical history on file.  Family Psychiatric History:   Family History:  Family History  Problem Relation Age of Onset   Schizophrenia Mother    Bipolar disorder Sister     Social History:  Social History   Socioeconomic History   Marital status: Married    Spouse name: Not on file   Number of children: Not on file   Years of education: Not on file   Highest education level: Not on file  Occupational History   Not on file  Tobacco Use   Smoking status: Never   Smokeless tobacco: Never  Substance and Sexual Activity   Alcohol use: Never   Drug use: Never   Sexual activity: Not on file  Other  Topics Concern   Not on file  Social History Narrative   Not on file   Social Drivers of Health   Financial Resource Strain: Not on file  Food Insecurity: No Food Insecurity (12/15/2023)   Hunger Vital Sign    Worried About Running Out of Food in the Last Year: Never true    Ran Out of Food in the Last Year: Never true  Transportation Needs: Patient Unable To Answer (12/15/2023)   PRAPARE - Transportation    Lack of Transportation (Medical): Patient unable to answer    Lack of Transportation  (Non-Medical): Patient unable to answer  Physical Activity: Not on file  Stress: Not on file  Social Connections: Not on file    Allergies:  Allergies  Allergen Reactions   Dairycare [Bacid] Anaphylaxis   Egg-Derived Products Anaphylaxis   Methylprednisolone Acetate Anaphylaxis and Other (See Comments)    Throat Swelling and Airway Obstruction   Banana Swelling and Other (See Comments)    Eye swelling   Shellfish Allergy Other (See Comments)    "I was told to stay away from it"   Pineapple Itching   Watermelon [Citrullus Vulgaris] Other (See Comments)    Tongue tingling    Metabolic Disorder Labs: Lab Results  Component Value Date   HGBA1C 5.9 (H) 12/15/2023   MPG 123 12/15/2023   MPG 120 11/07/2022   Lab Results  Component Value Date   PROLACTIN 12.7 11/07/2022   Lab Results  Component Value Date   CHOL 296 (H) 12/15/2023   TRIG 54 12/15/2023   HDL 113 12/15/2023   CHOLHDL 2.6 12/15/2023   VLDL 11 12/15/2023   LDLCALC 172 (H) 12/15/2023   LDLCALC 128 (H) 11/07/2022   Lab Results  Component Value Date   TSH 1.151 12/15/2023   TSH 3.326 12/12/2022    Therapeutic Level Labs: No results found for: "LITHIUM" No results found for: "VALPROATE" No results found for: "CBMZ"  Current Medications: Current Outpatient Medications  Medication Sig Dispense Refill   OLANZapine (ZYPREXA) 5 MG tablet Take 1 tablet (5 mg total) by mouth at bedtime. 30 tablet 2   prazosin (MINIPRESS) 1 MG capsule Take 1 capsule (1 mg total) by mouth at bedtime. 30 capsule 1   albuterol (VENTOLIN HFA) 108 (90 Base) MCG/ACT inhaler Inhale into the lungs every 6 (six) hours as needed for wheezing or shortness of breath. (Patient not taking: Reported on 12/15/2023)     calcium carbonate (SUPER CALCIUM) 1500 (600 Ca) MG TABS tablet Take 600 mg of elemental calcium by mouth daily with breakfast.     cyanocobalamin (VITAMIN B12) 1000 MCG tablet Take 1,000 mcg by mouth 2 (two) times daily.      desonide (DESOWEN) 0.05 % ointment Apply 1 Application topically 2 (two) times daily.     EPINEPHrine 0.3 mg/0.3 mL IJ SOAJ injection Inject 0.3 mg into the muscle as needed for anaphylaxis. (Patient not taking: Reported on 12/15/2023)     hydrOXYzine (VISTARIL) 25 MG capsule Take 1 capsule (25 mg total) by mouth at bedtime. May take one extra as needed. 60 capsule 0   Multiple Vitamins-Minerals (ADULT GUMMY PO) Take 2 tablets by mouth daily.     No current facility-administered medications for this visit.     Musculoskeletal: Virtual assessment   Psychiatric Specialty Exam: Review of Systems  Psychiatric/Behavioral:  Positive for sleep disturbance. The patient is not nervous/anxious (mild).   All other systems reviewed and are negative.   Last menstrual period 09/08/2012.There  is no height or weight on file to calculate BMI.  General Appearance: Casual  Eye Contact:   Reactive and engaged.  Does look away when talking about auditory visual hallucinations and negative self talk.  Speech:  Clear and Coherent  Volume:  Normal  Mood:  Euthymic  Affect:  Congruent  Thought Process:  Coherent  Orientation:  Full (Time, Place, and Person)  Thought Content: Logical   Suicidal Thoughts:  No  Homicidal Thoughts:  No  Memory:  Immediate;   Good  Judgement:  Good  Insight:  Fair  Psychomotor Activity:  Normal  Concentration:  Concentration: Good  Recall:  Fair  Fund of Knowledge: Good  Language: Good  Akathisia:  No  Handed:  Right  AIMS (if indicated): not done  Assets:  Communication Skills Desire for Improvement  ADL's:  Intact  Cognition: WNL  Sleep:  Fair   Screenings: AIMS    Flowsheet Row [facility]  AIMS Total Score 0      PHQ2-9    Flowsheet Row [facility]  PHQ-2 Total Score 5  PHQ-9 Total Score 20      Flowsheet  Row ED from [facility]15/2023 [facility] ED from 12/14/2023 in [facility] Video Visit from 11/07/2023 [facility]  C-SSRS RISK CATEGORY No Risk No Risk Low Risk       Assessment and Plan:  Brandy Hogan 58 year old African-American female presents for medication management appointment.  Recently discharged from  [facility]. Brandy Hogan reported she has been taking olanzapine 5 mg nightly.  Reported she has a follow-up with neurology for additional testing.  States she was diagnosed with autoimmune disorder which may be attributing to her symptoms.  No other documented concerns at this time.  Appeared more receptive and was reactive throughout this assessment.  Patient to continue Zyprexa 5 mg nightly as directed.  Support, encouragement  and reassurance was provided  Collaboration of Care: Collaboration of Care: Medication Management AEB refill Zyprexa 5 mg nightly reports she is taking Minipress 1 mg nightly as well  Patient/Guardian was advised Release of Information must be obtained prior to any record release in order to collaborate their care with an outside provider. Patient/Guardian was advised if they have not already done so to contact the registration department to sign all necessary forms in order for [place] to release information regarding their care.   Consent: Patient/Guardian gives verbal consent for treatment and assignment of benefits for services provided during this visit. Patient/Guardian expressed understanding and agreed to proceed.    Brandy Rack, NP 02/04/2024, 9:48 AM

## 2024-02-08 ENCOUNTER — Telehealth (HOSPITAL_COMMUNITY): Payer: No Typology Code available for payment source | Admitting: Family

## 2024-02-08 ENCOUNTER — Telehealth (HOSPITAL_COMMUNITY): Payer: Self-pay | Admitting: Professional

## 2024-02-20 ENCOUNTER — Other Ambulatory Visit (HOSPITAL_COMMUNITY): Payer: Self-pay | Admitting: Family

## 2024-02-26 NOTE — Telephone Encounter (Signed)
 Spoke w/ pt and confirmed PCP is Dr. Lequita Flor.  Phone # [phone]  Office closed today so I could not get fax #   Need to fax lab results

## 2024-02-29 NOTE — Telephone Encounter (Signed)
 Fax # [phone]

## 2024-02-29 NOTE — Telephone Encounter (Signed)
 Faxed as requested

## 2024-03-21 ENCOUNTER — Other Ambulatory Visit: Payer: Self-pay | Admitting: Internal Medicine

## 2024-03-21 DIAGNOSIS — Z1231 Encounter for screening mammogram for malignant neoplasm of breast: Secondary | ICD-10-CM

## 2024-05-04 ENCOUNTER — Ambulatory Visit
Admission: RE | Admit: 2024-05-04 | Discharge: 2024-05-04 | Disposition: A | Discharge status: Home / Self Care | Payer: Self-pay | Source: Ambulatory Visit | Attending: Internal Medicine | Admitting: Internal Medicine

## 2024-05-04 DIAGNOSIS — Z1231 Encounter for screening mammogram for malignant neoplasm of breast: Secondary | ICD-10-CM

## 2024-07-21 ENCOUNTER — Ambulatory Visit (INDEPENDENT_AMBULATORY_CARE_PROVIDER_SITE_OTHER)
Admission: EM | Admit: 2024-07-21 | Discharge: 2024-07-22 | Disposition: A | Discharge status: Other Acute Inpatient Hospital | Source: Home / Self Care | Admitting: Nurse Practitioner

## 2024-07-21 DIAGNOSIS — F332 Major depressive disorder, recurrent severe without psychotic features: Secondary | ICD-10-CM | POA: Insufficient documentation

## 2024-07-21 DIAGNOSIS — F419 Anxiety disorder, unspecified: Secondary | ICD-10-CM | POA: Insufficient documentation

## 2024-07-21 DIAGNOSIS — F429 Obsessive-compulsive disorder, unspecified: Secondary | ICD-10-CM | POA: Insufficient documentation

## 2024-07-21 DIAGNOSIS — G4700 Insomnia, unspecified: Secondary | ICD-10-CM | POA: Insufficient documentation

## 2024-07-21 DIAGNOSIS — Z79899 Other long term (current) drug therapy: Secondary | ICD-10-CM | POA: Insufficient documentation

## 2024-07-21 LAB — LIPID PANEL
Cholesterol: 254 mg/dL — ABNORMAL HIGH (ref 0–200)
HDL: 109 mg/dL (ref >40)
LDL Cholesterol: 134 mg/dL — ABNORMAL HIGH (ref 0–99)
Total CHOL/HDL Ratio: 2.3 ratio
Triglycerides: 54 mg/dL (ref <150)
VLDL: 11 mg/dL (ref 0–40)

## 2024-07-21 LAB — CBC WITH DIFFERENTIAL/PLATELET
Abs Granulocyte: 3.3 K/uL (ref 1.5–6.5)
Abs Immature Granulocytes: 0.01 K/uL (ref 0.00–0.07)
Basophils Absolute: 0 K/uL (ref 0.0–0.1)
Basophils Relative: 0 %
Eosinophils Absolute: 0.1 K/uL (ref 0.0–0.5)
Eosinophils Relative: 2 %
HCT: 40.8 % (ref 36.0–46.0)
Hemoglobin: 13.1 g/dL (ref 12.0–15.0)
Immature Granulocytes: 0 %
Lymphocytes Relative: 30 %
Lymphs Abs: 1.7 K/uL (ref 0.7–4.0)
MCH: 28.4 pg (ref 26.0–34.0)
MCHC: 32.1 g/dL (ref 30.0–36.0)
MCV: 88.5 fL (ref 80.0–100.0)
Monocytes Absolute: 0.4 K/uL (ref 0.1–1.0)
Monocytes Relative: 8 %
Neutro Abs: 3.3 K/uL (ref 1.7–7.7)
Neutrophils Relative %: 60 %
Platelets: 309 K/uL (ref 150–400)
RBC: 4.61 MIL/uL (ref 3.87–5.11)
RDW: 14.7 % (ref 11.5–15.5)
WBC: 5.6 K/uL (ref 4.0–10.5)
nRBC: 0 % (ref 0.0–0.2)

## 2024-07-21 LAB — URINALYSIS, COMPLETE (UACMP) WITH MICROSCOPIC
Bilirubin Urine: NEGATIVE
Glucose, UA: NEGATIVE mg/dL
Hgb urine dipstick: NEGATIVE
Ketones, ur: NEGATIVE mg/dL
Nitrite: NEGATIVE
Protein, ur: NEGATIVE mg/dL
Specific Gravity, Urine: 1.018 (ref 1.005–1.030)
WBC, UA: >50 WBC/hpf (ref 0–5)
pH: 6 (ref 5.0–8.0)

## 2024-07-21 LAB — COMPREHENSIVE METABOLIC PANEL WITH GFR
ALT: 18 U/L (ref 0–44)
AST: 23 U/L (ref 15–41)
Albumin: 4.5 g/dL (ref 3.5–5.0)
Alkaline Phosphatase: 49 U/L (ref 38–126)
Anion gap: 6 (ref 5–15)
BUN: 13 mg/dL (ref 6–20)
CO2: 25 mmol/L (ref 22–32)
Calcium: 10 mg/dL (ref 8.9–10.3)
Chloride: 107 mmol/L (ref 98–111)
Creatinine, Ser: 0.92 mg/dL (ref 0.44–1.00)
GFR, Estimated: >60 mL/min (ref >60)
Glucose, Bld: 96 mg/dL (ref 70–99)
Potassium: 4.2 mmol/L (ref 3.5–5.1)
Sodium: 138 mmol/L (ref 135–145)
Total Bilirubin: 0.5 mg/dL (ref 0.0–1.2)
Total Protein: 7.6 g/dL (ref 6.5–8.1)

## 2024-07-21 LAB — POCT URINE DRUG SCREEN - MANUAL ENTRY (I-SCREEN)
POC Amphetamine UR: NOT DETECTED
POC Buprenorphine (BUP): NOT DETECTED
POC Cocaine UR: NOT DETECTED
POC Marijuana UR: NOT DETECTED
POC Methadone UR: NOT DETECTED
POC Methamphetamine UR: NOT DETECTED
POC Morphine: NOT DETECTED
POC Oxazepam (BZO): NOT DETECTED
POC Oxycodone UR: NOT DETECTED
POC Secobarbital (BAR): NOT DETECTED

## 2024-07-21 LAB — ETHANOL: Alcohol, Ethyl (B): <15 mg/dL (ref <15)

## 2024-07-21 LAB — POC URINE PREG, ED: Preg Test, Ur: NEGATIVE

## 2024-07-21 LAB — HEMOGLOBIN A1C
Hgb A1c MFr Bld: 5.6 % (ref 4.8–5.6)
Mean Plasma Glucose: 114 mg/dL

## 2024-07-21 LAB — TSH: TSH: 2.263 u[IU]/mL (ref 0.350–4.500)

## 2024-07-21 LAB — MAGNESIUM: Magnesium: 2.1 mg/dL (ref 1.7–2.4)

## 2024-07-21 MED ORDER — FLUVOXAMINE MALEATE 50 MG PO TABS
25.0000 mg | ORAL_TABLET | Freq: Every day | ORAL | Status: DC
  Administered 2024-07-21: 25 mg via ORAL
  Filled 2024-07-21: qty 1

## 2024-07-21 MED ORDER — TRAZODONE HCL 50 MG PO TABS: 50.0000 mg | ORAL_TABLET | Freq: Every evening | ORAL | Status: DC | PRN

## 2024-07-21 MED ORDER — ALUM & MAG HYDROXIDE-SIMETH 200-200-20 MG/5ML PO SUSP: 30.0000 mL | ORAL | Status: DC | PRN

## 2024-07-21 MED ORDER — BREXPIPRAZOLE 0.25 MG PO TABS
0.2500 mg | ORAL_TABLET | Freq: Every day | ORAL | Status: DC
  Filled 2024-07-21 (×2): qty 1

## 2024-07-21 MED ORDER — ACETAMINOPHEN 325 MG PO TABS: 650.0000 mg | ORAL_TABLET | Freq: Four times a day (QID) | ORAL | Status: DC | PRN

## 2024-07-21 MED ORDER — OLANZAPINE 5 MG PO TBDP: 5.0000 mg | ORAL_TABLET | Freq: Three times a day (TID) | ORAL | Status: DC | PRN

## 2024-07-21 MED ORDER — MAGNESIUM HYDROXIDE 400 MG/5ML PO SUSP: 30.0000 mL | Freq: Every day | ORAL | Status: DC | PRN

## 2024-07-21 MED ORDER — ARIPIPRAZOLE 5 MG PO TABS: 5.0000 mg | ORAL_TABLET | Freq: Every day | ORAL | Status: DC

## 2024-07-21 MED ORDER — OLANZAPINE 10 MG IM SOLR: 5.0000 mg | Freq: Three times a day (TID) | INTRAMUSCULAR | Status: DC | PRN

## 2024-07-21 NOTE — ED Provider Notes (Cosign Needed Addendum)
 [facility] Continuous Assessment Admission H&P  Date: 07/21/24 Patient Name: Brandy Hogan MRN: [id number] Chief Complaint: Worsening intrusive thoughts.   Diagnoses:  Final diagnoses:  Obsessive-compulsive disorder, unspecified type  MDD (major depressive disorder), recurrent severe, without psychosis (HCC)   HPI: Brandy Hogan is a 58 y.o. female with a prior mental health history of MDD who presented with her husband to this behavioral health hospital with complaints of worsening insomnia & anxiety.   On assessment, the patient reports worsening intrusive thoughts of impending doom, states that she has irrational thoughts, feeling as though something bad will happen to her family, recognizes that the thoughts are irrational, but is unable to get the thoughts out of my mind.  She reports that the thoughts started when she became hospitalized starting in January of this year, and medications that were started at the time were not helpful.  As per chart review, patient was started on Risperdal  1 mg nightly, as well as mirtazapine  15 mg nightly, which she states she stopped shortly after that hospitalization.  Patient is a poor historian, as hospitalization was in January 2024.  At that time at that time, primary diagnosis was undifferentiated schizophrenia, which is most likely not accurate, as Clinical research associate spent a considerable amount of time with patient and her husband, and both provided same history:  Patient is not hearing voices, has never heard voices. She has intrusive thoughts and as per their description, are her own internal dialogues, which consist of racing thoughts, which are pervasive, intrusive, irrational, disturbing, unwanted, and bothersome, yet, she is not able to stop herself from thinking the way she does; patient shares that she feels as though something bad will happen to her family, shares that she feels as though something will happen to herself, shares that  earlier today morning she had intrusive thoughts to walk into traffic.  States that she always thinks of worst case scenarios, states that the thoughts are debilitating, everything revolves around the negative thoughts, she is unable to sleep, and as a result, she has feelings of sadness, which have gradually consumed her quality of life, she in turn has fallen into a deep depressive state of mind and everything else has suffered; She has decreased motivation levels, is not tending to personal hygiene and grooming needs, and not feeling guilty related to the symptoms that she is having, which is causing her a significant amount of distress.  Patient denies HI, denies current SI, presents with passive SI, had thoughts earlier today to walk into traffic. Denies AVH.  Patient denies paranoia, denies delusional thinking.  Denies any symptoms.   Patient current's current presentation is more likely more significant of intrusive thoughts consistent with OCD than with Schizophrenia. She appears guarded, but mostly likely due to her anxiety.   Agreeable to starting Fluvoxamine  25 mg nightly for management of intrusive thoughts as well as well as Rexulti  0.25 mg nightly for augmentation. Patient and husband educated on medications rationales, benefits and possible side effects and both verbalize understanding and are agreeable to trials.   Total Time spent with patient: 1.5 hours  Musculoskeletal  Strength & Muscle Tone: within normal limits Gait & Station: normal Patient leans: N/A  Psychiatric Specialty Exam  Presentation General Appearance:  Disheveled  Eye Contact: Fair  Speech: Clear and Coherent  Speech Volume: Normal  Handedness: Right   Mood and Affect  Mood: Anxious; Depressed  Affect: Depressed   Thought Process  Thought Processes: Coherent  Descriptions of  Associations:Intact  Orientation:Full (Time, Place and Person)  Thought Content:Logical  Diagnosis of  Schizophrenia or Schizoaffective disorder in past: Yes  Duration of Psychotic Symptoms: Greater than six months  Hallucinations:Hallucinations: None  Ideas of Reference:None  Suicidal Thoughts:Suicidal Thoughts: Yes, Passive SI Passive Intent and/or Plan: Without Intent; With Plan  Homicidal Thoughts:Homicidal Thoughts: No   Sensorium  Memory: Immediate Fair  Judgment: Fair  Insight: Fair   Chartered certified accountant: Fair  Attention Span: Fair  Recall: [employer] of Knowledge: Fair  Language: Fair   Psychomotor Activity  Psychomotor Activity:Psychomotor Activity: Normal   Assets  Assets: Resilience   Sleep  Sleep:Sleep: Poor   No data recorded  Physical Exam Constitutional:      Appearance: Normal appearance.  Pulmonary:     Effort: Pulmonary effort is normal.  Musculoskeletal:     Cervical back: Normal range of motion.  Neurological:     General: No focal deficit present.     Mental Status: She is alert and oriented to person, place, and time.  Psychiatric:        Behavior: Behavior normal.        Judgment: Judgment normal.    Review of Systems  Psychiatric/Behavioral:  Positive for depression and suicidal ideas. Negative for hallucinations, memory loss and substance abuse. The patient is nervous/anxious and has insomnia.   All other systems reviewed and are negative.   Blood pressure 120/77, pulse 81, temperature 97.7 F (36.5 C), temperature source Oral, resp. rate 18, last menstrual period 09/08/2012, SpO2 99%. There is no height or weight on file to calculate BMI.  Is the patient at risk to self? Yes  Has the patient been a risk to self in the past 6 months? Yes .    Has the patient been a risk to self within the distant past? Yes   Is the patient a risk to others? No   Has the patient been a risk to others in the past 6 months? No   Has the patient been a risk to others within the distant past? No   Past Medical  History: denies   Family History: denies   Social History: Lives with husband and 48 yo son  Last Labs:  Admission on 07/21/2024  Component Date Value Ref Range Status   Preg Test, Ur 07/21/2024 Negative  Negative Final   POC Amphetamine UR 07/21/2024 None Detected  NONE DETECTED (Cut Off Level 1000 ng/mL) Final   POC Secobarbital (BAR) 07/21/2024 None Detected  NONE DETECTED (Cut Off Level 300 ng/mL) Final   POC Buprenorphine (BUP) 07/21/2024 None Detected  NONE DETECTED (Cut Off Level 10 ng/mL) Final   POC Oxazepam (BZO) 07/21/2024 None Detected  NONE DETECTED (Cut Off Level 300 ng/mL) Final   POC Cocaine UR 07/21/2024 None Detected  NONE DETECTED (Cut Off Level 300 ng/mL) Final   POC Methamphetamine UR 07/21/2024 None Detected  NONE DETECTED (Cut Off Level 1000 ng/mL) Final   POC Morphine 07/21/2024 None Detected  NONE DETECTED (Cut Off Level 300 ng/mL) Final   POC Methadone UR 07/21/2024 None Detected  NONE DETECTED (Cut Off Level 300 ng/mL) Final   POC Oxycodone UR 07/21/2024 None Detected  NONE DETECTED (Cut Off Level 100 ng/mL) Final   POC Marijuana UR 07/21/2024 None Detected  NONE DETECTED (Cut Off Level 50 ng/mL) Final    Allergies: Dairycare [bacid], Egg-derived products, Methylprednisolone acetate, Banana, Shellfish allergy, Pineapple, and Watermelon [citrullus vulgaris]  Medications:  Facility Ordered Medications  Medication  acetaminophen  (TYLENOL ) tablet 650 mg   alum & mag hydroxide-simeth (MAALOX/MYLANTA) 200-200-20 MG/5ML suspension 30 mL   magnesium  hydroxide (MILK OF MAGNESIA) suspension 30 mL   OLANZapine  (ZYPREXA ) injection 5 mg   OLANZapine  zydis (ZYPREXA ) disintegrating tablet 5 mg   traZODone  (DESYREL ) tablet 50 mg   brexpiprazole  (REXULTI ) tablet 0.25 mg   fluvoxaMINE  (LUVOX ) tablet 25 mg   PTA Medications  Medication Sig   EPINEPHrine  0.3 mg/0.3 mL IJ SOAJ injection Inject 0.3 mg into the muscle as needed for anaphylaxis. (Patient not taking: Reported  on 12/15/2023)   albuterol  (VENTOLIN  HFA) 108 (90 Base) MCG/ACT inhaler Inhale into the lungs every 6 (six) hours as needed for wheezing or shortness of breath. (Patient not taking: Reported on 12/15/2023)   calcium carbonate (SUPER CALCIUM) 1500 (600 Ca) MG TABS tablet Take 600 mg of elemental calcium by mouth daily with breakfast.   cyanocobalamin  (VITAMIN B12) 1000 MCG tablet Take 1,000 mcg by mouth 2 (two) times daily.   hydrOXYzine  (VISTARIL ) 25 MG capsule Take 1 capsule (25 mg total) by mouth at bedtime. May take one extra as needed.   desonide (DESOWEN) 0.05 % ointment Apply 1 Application topically 2 (two) times daily.   Multiple Vitamins-Minerals (ADULT GUMMY PO) Take 2 tablets by mouth daily.   OLANZapine  (ZYPREXA ) 5 MG tablet Take 1 tablet (5 mg total) by mouth at bedtime.   prazosin  (MINIPRESS ) 1 MG capsule Take 1 capsule (1 mg total) by mouth at bedtime.   Medical Decision Making  -Recommended for inpatient hospitalization for treatment and stabilization of mental status. -Start Luvox  25 mg nightly for intrusive tholughts -Start Rexulti  0.25 mg nightly for augmentation of antidepressant  -Agitation protocol meds ordered  Recommendations  Based on my evaluation the patient appears to have an emergency mental health condition for which I recommend the patient be transferred to an inpatient behavioral health unit for treatment and stabilization.   Donia Snell, NP 07/21/24  6:37 PM

## 2024-07-21 NOTE — Progress Notes (Signed)
   07/21/24 1622  [facility] Triage Screening (Walk-ins at [facility] only)  How Did You Hear About Us ? Family/Friend  What Is the Reason for Your Visit/Call Today? Brandy Hogan, a 58 year old female with a history of undifferentiated schizophrenia, major depressive disorder (MDD), and intrusive thoughts, presented to [facility] accompanied by her spouse. She reported experiencing a worsening of negative thoughts, persistent worry, difficulty sleeping, increased anxiety, and a constant sense that something bad is going to happen. These symptoms have been present for the past 1-2 days. Their is also a noted hx of similar symptoms. She specifically expressed concern for the well-being of her children. [name] also reported experiencing suicidal thoughts as recently as earlier today, stating she had thoughts of walking into traffic, though she emphasized she would never actually act on these thoughts. She denied any history of suicide attempts or self-harming gestures, as well as any homicidal ideation or auditory/visual hallucinations. She also denied any substance use. She avoided eye contact and appeared withdrawn and guarded throughout the encounter.  How Long Has This Been Causing You Problems? <Week  Have You Recently Had Any Thoughts About Hurting Yourself? Yes  How long ago did you have thoughts about hurting yourself? Patient reports suicidal thoughts earlier today.  Are You Planning to Commit Suicide/Harm Yourself At This time? No  Have you Recently Had Thoughts About Hurting Someone [name]? No  Are You Planning To Harm Someone At This Time? No  Physical Abuse Denies  Verbal Abuse Denies  Sexual Abuse Denies  Exploitation of patient/patient's resources Denies  Self-Neglect Denies  Possible abuse reported to:  (n/a)  Are you currently experiencing any auditory, visual or other hallucinations? No  Have You Used Any Alcohol or Drugs in the Past 24 Hours? No  Do you have any current medical  co-morbidities that require immediate attention? No  Clinician description of patient physical appearance/behavior: soft spoken, calm, cooperative.  What Do You Feel Would Help You the Most Today? Treatment for Depression or other mood problem;Stress Management  If access to [facility] Urgent [facility] was not available, would you have sought care in the Emergency Department? No  Determination of Need Urgent (48 hours)  Options For Referral Inpatient Hospitalization;Medication Management  Determination of Need filed? Yes
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
## 2024-07-21 NOTE — ED Notes (Signed)
 Pt is asleep in recliner. No distress noted at this time. Staff will continue to monitor pt for safety.

## 2024-07-21 NOTE — ED Notes (Signed)
 Pt arrived to adult obs area. AOX4. Denies pain or discomfort. Denies SI/HI and AVH at this time. Does report feeling anxious. Oriented her to the unit and safety measures initiated. Reports coming to [facility] urgent care with husband.

## 2024-07-22 ENCOUNTER — Encounter (HOSPITAL_COMMUNITY): Payer: Self-pay | Admitting: Nurse Practitioner

## 2024-07-22 ENCOUNTER — Other Ambulatory Visit: Payer: Self-pay

## 2024-07-22 ENCOUNTER — Inpatient Hospital Stay (HOSPITAL_COMMUNITY)
Admission: AD | Admit: 2024-07-22 | Discharge: 2024-07-28 | DRG: 885 | Disposition: A | Discharge status: Home / Self Care | Source: Intra-hospital | Attending: Psychiatry | Admitting: Psychiatry

## 2024-07-22 ENCOUNTER — Encounter (HOSPITAL_COMMUNITY): Payer: Self-pay

## 2024-07-22 DIAGNOSIS — Z91199 Patient's noncompliance with other medical treatment and regimen due to unspecified reason: Secondary | ICD-10-CM

## 2024-07-22 DIAGNOSIS — F333 Major depressive disorder, recurrent, severe with psychotic symptoms: Principal | ICD-10-CM | POA: Diagnosis present

## 2024-07-22 DIAGNOSIS — F429 Obsessive-compulsive disorder, unspecified: Secondary | ICD-10-CM | POA: Diagnosis present

## 2024-07-22 DIAGNOSIS — F332 Major depressive disorder, recurrent severe without psychotic features: Secondary | ICD-10-CM | POA: Diagnosis not present

## 2024-07-22 DIAGNOSIS — Z91012 Allergy to eggs: Secondary | ICD-10-CM

## 2024-07-22 DIAGNOSIS — F4310 Post-traumatic stress disorder, unspecified: Secondary | ICD-10-CM | POA: Diagnosis present

## 2024-07-22 DIAGNOSIS — Z91018 Allergy to other foods: Secondary | ICD-10-CM

## 2024-07-22 DIAGNOSIS — J45909 Unspecified asthma, uncomplicated: Secondary | ICD-10-CM | POA: Diagnosis present

## 2024-07-22 DIAGNOSIS — Z888 Allergy status to other drugs, medicaments and biological substances status: Secondary | ICD-10-CM | POA: Diagnosis not present

## 2024-07-22 DIAGNOSIS — F411 Generalized anxiety disorder: Secondary | ICD-10-CM | POA: Diagnosis present

## 2024-07-22 DIAGNOSIS — G4700 Insomnia, unspecified: Secondary | ICD-10-CM | POA: Diagnosis present

## 2024-07-22 DIAGNOSIS — Z91013 Allergy to seafood: Secondary | ICD-10-CM | POA: Diagnosis not present

## 2024-07-22 DIAGNOSIS — Z91411 Personal history of adult psychological abuse: Secondary | ICD-10-CM

## 2024-07-22 DIAGNOSIS — Z79899 Other long term (current) drug therapy: Secondary | ICD-10-CM | POA: Diagnosis not present

## 2024-07-22 DIAGNOSIS — Z818 Family history of other mental and behavioral disorders: Secondary | ICD-10-CM | POA: Diagnosis not present

## 2024-07-22 LAB — HIV ANTIBODY (ROUTINE TESTING W REFLEX): HIV Screen 4th Generation wRfx: NONREACTIVE

## 2024-07-22 LAB — FOLATE: Folate: 21.7 ng/mL (ref >5.9)

## 2024-07-22 LAB — VITAMIN D 25 HYDROXY (VIT D DEFICIENCY, FRACTURES): Vit D, 25-Hydroxy: 49.17 ng/mL (ref 30–100)

## 2024-07-22 MED ORDER — OLANZAPINE 10 MG IM SOLR: 5.0000 mg | Freq: Three times a day (TID) | INTRAMUSCULAR | Status: DC | PRN

## 2024-07-22 MED ORDER — OLANZAPINE 5 MG PO TBDP: 5.0000 mg | ORAL_TABLET | Freq: Three times a day (TID) | ORAL | Status: DC | PRN

## 2024-07-22 MED ORDER — SERTRALINE HCL 50 MG PO TABS
50.0000 mg | ORAL_TABLET | Freq: Every day | ORAL | Status: DC
  Administered 2024-07-23 – 2024-07-28 (×9): 50 mg via ORAL
  Filled 2024-07-22 (×7): qty 1

## 2024-07-22 MED ORDER — ACETAMINOPHEN 325 MG PO TABS: 650.0000 mg | ORAL_TABLET | Freq: Four times a day (QID) | ORAL | Status: DC | PRN

## 2024-07-22 MED ORDER — HYDROXYZINE HCL 25 MG PO TABS
25.0000 mg | ORAL_TABLET | Freq: Three times a day (TID) | ORAL | Status: DC | PRN
  Administered 2024-07-23 – 2024-07-25 (×3): 25 mg via ORAL
  Filled 2024-07-22 (×4): qty 1

## 2024-07-22 MED ORDER — ALUM & MAG HYDROXIDE-SIMETH 200-200-20 MG/5ML PO SUSP: 30.0000 mL | ORAL | Status: DC | PRN

## 2024-07-22 MED ORDER — TRAZODONE HCL 50 MG PO TABS
50.0000 mg | ORAL_TABLET | Freq: Every evening | ORAL | Status: DC | PRN
  Administered 2024-07-23 – 2024-07-27 (×7): 50 mg via ORAL
  Filled 2024-07-22 (×5): qty 1

## 2024-07-22 MED ORDER — QUETIAPINE FUMARATE 100 MG PO TABS
100.0000 mg | ORAL_TABLET | Freq: Every day | ORAL | Status: DC
  Administered 2024-07-22: 100 mg via ORAL
  Filled 2024-07-22: qty 1

## 2024-07-22 MED ORDER — MAGNESIUM HYDROXIDE 400 MG/5ML PO SUSP: 30.0000 mL | Freq: Every day | ORAL | Status: DC | PRN

## 2024-07-22 NOTE — Tx Team (Signed)
 Initial Treatment Plan 07/22/2024 5:40 AM Channing English Thole FMW:[id number]    PATIENT STRESSORS: Other: Stressors are uncertainty.     PATIENT STRENGTHS: Ability for insight  Motivation for treatment/growth  Physical Health  Special hobby/interest    PATIENT IDENTIFIED PROBLEMS: Insomnia. Not able to function outside of home, Anxiety, & MDD                     DISCHARGE CRITERIA:  Improved stabilization in mood, thinking, and/or behavior Motivation to continue treatment in a less acute level of care Reduction of life-threatening or endangering symptoms to within safe limits Verbal commitment to aftercare and medication compliance  PRELIMINARY DISCHARGE PLAN: Attend aftercare/continuing care group Attend PHP/IOP Return to previous living arrangement  PATIENT/FAMILY INVOLVEMENT: This treatment plan has been presented to and reviewed with the patient, Avina Eberle.  The patient has been given the opportunity to ask questions and make suggestions.  Bronwyn ONEIDA Sharps, RN 07/22/2024, 5:40 AM

## 2024-07-22 NOTE — BH Assessment (Signed)
(  Sleep Hours) - 0.75 (Any PRNs that were needed, meds refused, or side effects to meds)- None (Any disturbances and when (visitation, over night)- Noted insomnia  (Concerns raised by the patient)- None (SI/HI/AVH)- Denies Past Hx SI/Passive thoughts to run into traffic.

## 2024-07-22 NOTE — ED Notes (Signed)
 Pt is asleep in recliner. No distress noted. Staff will continue to monitor pt for safety.

## 2024-07-22 NOTE — BHH Suicide Risk Assessment (Signed)
 Suicide Risk Assessment  Admission Assessment    [facility] Admission Suicide Risk Assessment   Nursing information obtained from:     Demographic factors:  NA  Current Mental Status:  NA  Loss Factors:  NA  Historical Factors:  NA  Risk Reduction Factors:  NA  Total Time spent with patient: 1.5 hours  Principal Problem: Obsessive-compulsive disorder, unspecified type  Diagnosis:  Principal Problem:   Obsessive-compulsive disorder, unspecified type  Subjective Data: See H&P.  Continued Clinical Symptoms:  Alcohol Use Disorder Identification Test Final Score (AUDIT): 0 The Alcohol Use Disorders Identification Test, Guidelines for Use in Primary Care, Second Edition.  World Science writer [facility]). Score between 0-7:  no or low risk or alcohol related problems. Score between 8-15:  moderate risk of alcohol related problems. Score between 16-19:  high risk of alcohol related problems. Score 20 or above:  warrants further diagnostic evaluation for alcohol dependence and treatment.  CLINICAL FACTORS:   Severe Anxiety and/or Agitation Depression:   Hopelessness Insomnia More than one psychiatric diagnosis Unstable or Poor Therapeutic Relationship Previous Psychiatric Diagnoses and Treatments  Musculoskeletal: Strength & Muscle Tone: within normal limits Gait & Station: normal Patient leans: N/A  Psychiatric Specialty Exam:  Presentation  General Appearance:  Disheveled  Eye Contact: Fair  Speech: Clear and Coherent; Slow  Speech Volume: Decreased  Handedness: Right  Mood and Affect  Mood: Anxious; Depressed; Hopeless  Affect: Congruent; Depressed; Flat  Thought Process  Thought Processes: -- (Patient appears to processing information.)  Descriptions of Associations:Tangential  Orientation:Full (Time, Place and Person)  Thought Content:Rumination  History of Schizophrenia/Schizoaffective disorder:Yes  Duration of Psychotic Symptoms:Greater  than six months  Hallucinations:Hallucinations: None  Ideas of Reference:None  Suicidal Thoughts:Suicidal Thoughts: No SI Passive Intent and/or Plan: Without Intent; With Plan  Homicidal Thoughts:Homicidal Thoughts: No  Sensorium  Memory: Immediate Good; Recent Fair; Remote Fair  Judgment: Fair  Insight: Fair  Chartered certified accountant: Fair  Attention Span: Fair  Recall: [employer] of Knowledge: Fair  Language: Fair  Psychomotor Activity  Psychomotor Activity: Psychomotor Activity: Decreased  Assets  Assets: Communication Skills; Desire for Improvement; Housing; Social Support  Sleep  Sleep: Sleep: Poor Number of Hours of Sleep: 4  Physical/Ros Exam:  Blood pressure (!) 114/94, pulse 91, temperature 97.8 F (36.6 C), temperature source Oral, resp. rate 16, height 4' 11.75 (1.518 m), weight 49 kg, last menstrual period 09/08/2012, SpO2 100%. Body mass index is 21.27 kg/m.  COGNITIVE FEATURES THAT CONTRIBUTE TO RISK:  Polarized thinking and Thought constriction (tunnel vision)    SUICIDE RISK:   Moderate:  Frequent suicidal ideation with limited intensity, and duration, some specificity in terms of plans, no associated intent, good self-control, limited dysphoria/symptomatology, some risk factors present, and identifiable protective factors, including available and accessible social support.  PLAN OF CARE: See H&P.  I certify that inpatient services furnished can reasonably be expected to improve the patient's condition.   Mac Bolster, NP, pmhnp, fnp-bc. 07/22/2024, 11:53 AM

## 2024-07-22 NOTE — Group Note (Signed)
 Date:  07/22/2024 Time:  9:28 PM  Group Topic/Focus:  Wrap-Up Group:   The focus of this group is to help patients review their daily goal of treatment and discuss progress on daily workbooks.    Additional Comments:   Pt was encouraged, but opted out of attending wrap up group.  Brandy Hogan Pouch 07/22/2024, 9:28 PM

## 2024-07-22 NOTE — Progress Notes (Signed)
   07/22/24 2015  Psych Admission Type (Psych Patients Only)  Admission Status Voluntary  Psychosocial Assessment  Patient Complaints Disorientation;Depression  Eye Contact Avoids  Facial Expression Flat  Affect Depressed;Flat  Speech Soft  Interaction Poor  Motor Activity Other (Comment) (WNL)  Appearance/Hygiene Other (Comment) (Appropriate)  Behavior Characteristics Resistant to care;Unwilling to participate  Mood Preoccupied;Depressed  Thought Process  Coherency Disorganized  Content Preoccupation  Delusions None reported or observed  Perception WDL  Hallucination None reported or observed  Judgment Poor  Confusion Mild  Danger to Self  Current suicidal ideation?  (Denies)  Agreement Not to Harm Self Yes  Description of Agreement Notify Staff  Danger to Others  Danger to Others None reported or observed

## 2024-07-22 NOTE — Progress Notes (Signed)
 Patient ID: Stacie Templin, female   DOB: Oct 24, 1966, 58 y.o.   MRN: [id number]  [id number]  Pt ambulated to facility with a steady gait. Pt is a 58 yo female A/O x4 with noted MDD/Anxiety/Insomnia & OCD. Pt arrived at ED with noted SI/running into traffic. Pt currently denies SI/HI; V/H. Pt confirms A/H intrusive thoughts such as clicking noise. Meds whole. Pt confirmed taking Hydroxyzine  and Melatonin at home.  Allergies are noted in admission notes. Food/Fluids offered. 75% of salad intake. ADL's self. Skin assessed with noted dry patches to lips, bilat hands, & back of neck. Pt is Cont of B/B. Last BM 07/20/24. V/S WNL. Pt mood: calm but bizarre. When asked why admitted, Pt stated, Sleep aspect. Not really motivated to much of anything. Get myself together to do things outside of home. To get more rest and to calm my nerves. Pt was assisted to room. Adjusting to Unit. No noted distress or complaints of pain. Monitoring continues during 7p-7a shift.
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
## 2024-07-22 NOTE — Progress Notes (Signed)
 D: Patient is alert and somewhat cooperative. Denies SI, HI, AVH. Patient having trouble expressing her thoughts and processing what is being asked of her.    A: Patient tried to take her zoloft  but was eventually was unable to take it. She had the pill in her hand and considered taking it for 15 minutes or so but ended up handing it back to this RN. Support provided. Patient educated on safety on the unit and medications. Routine safety checks every 15 minutes. Patient stated understanding to tell nurse about any new physical symptoms. Patient understands to tell staff of any needs.     R: No adverse drug reactions noted. Patient remains safe at this time and will continue to monitor.    07/22/24 1300  Psych Admission Type (Psych Patients Only)  Admission Status Voluntary  Psychosocial Assessment  Patient Complaints Depression  Eye Contact Brief  Facial Expression Flat  Affect Depressed;Flat  Speech Soft  Interaction Poor  Motor Activity Other (Comment) (WNL)  Appearance/Hygiene Unremarkable  Behavior Characteristics Cooperative;Calm  Mood Depressed  Thought Process  Coherency Blocking  Content Preoccupation  Delusions None reported or observed  Perception WDL  Hallucination None reported or observed  Judgment Poor  Confusion Mild  Danger to Self  Current suicidal ideation? Denies  Agreement Not to Harm Self Yes  Description of Agreement verbal  Danger to Others  Danger to Others None reported or observed

## 2024-07-22 NOTE — Progress Notes (Signed)
 Adult Psychoeducational Group Note  Date:  07/22/2024 Time:  9:01 AM  Group Topic/Focus:  Goals Group:   The focus of this group is to help patients establish daily goals to achieve during treatment and discuss how the patient can incorporate goal setting into their daily lives to aide in recovery.  Participation Level:  Active  Participation Quality:  Appropriate  Affect:  Appropriate  Cognitive:  Appropriate  Insight: Appropriate  Engagement in Group:  Engaged  Modes of Intervention:  Discussion  Additional Comments:  Pt stated she is feeling better.  Pt stated her goal for the day is to try to get some sleep.  Daine Pillar D 07/22/2024, 9:01 AM

## 2024-07-22 NOTE — Plan of Care (Signed)
  Problem: Education: Goal: Knowledge of [facility] Education information/materials will improve Outcome: Progressing Goal: Emotional status will improve Outcome: Progressing Goal: Mental status will improve Outcome: Progressing Goal: Verbalization of understanding the information provided will improve Outcome: Progressing   Problem: Coping: Goal: Ability to demonstrate self-control will improve Outcome: Progressing   Problem: Safety: Goal: Periods of time without injury will increase Outcome: Progressing   Problem: Activity: Goal: Interest or engagement in activities will improve Outcome: Not Progressing

## 2024-07-22 NOTE — Group Note (Signed)
 Date:  07/22/2024 Time:  4:00 PM  Group Topic/Focus:  Healthy Communication:   The focus of this group is to discuss communication, barriers to communication, as well as healthy ways to communicate with others. We discussed healthy personal boundaries.    Participation Level:  Did Not Attend    Brandy Hogan 07/22/2024, 4:00 PM

## 2024-07-22 NOTE — Group Note (Signed)
 Recreation Therapy Group Note   Group Topic:Problem Solving  Group Date: 07/22/2024 Start Time: 0930 End Time: 1005 Facilitators: Sarim Rothman-McCall, LRT,CTRS Location: [address] Dayroom   Group Topic: Problem Solving  Goal Area(s) Addresses:  Patient will identify positive ways to complete puzzles presented.  Patient will work effectively with peer to solve the puzzles presented in group.  Behavioral Response:    Intervention: Group Work, Problem Solving  Activity: In pairs, patients were asked to create a game with their teammate. Team's were tasked with designing a game, including a Name, Description of Game, Equipment/Supplies, Rules, and Number of players needed. Teach back used to present their game idea to the group.  Education:  Leisure Scientist, physiological, Special educational needs teacher, Teamwork, Discharge Planning  Education Outcome: Acknowledges education/In group clarification offered/Needs additional education.    Affect/Mood: N/A   Participation Level: Did not attend    Clinical Observations/Individualized Feedback:     Plan: Continue to engage patient in RT group sessions 2-3x/week.   Brandy Hogan, LRT,CTRS 07/22/2024 12:10 PM

## 2024-07-22 NOTE — Progress Notes (Signed)
 Writer has conducted q15 min checks on this pt. Writer observed pt constantly mumbling while lying still on their back, with their eyes closed. Writer can not make out what the pt is mumbling. When writer attempts to engage pt and ask if they are okay. Pt does not respond. RN notified.

## 2024-07-22 NOTE — Plan of Care (Signed)

## 2024-07-22 NOTE — Progress Notes (Signed)
   07/22/24 0500  Psych Admission Type (Psych Patients Only)  Admission Status Voluntary  Psychosocial Assessment  Patient Complaints Decreased concentration;Depression;Insomnia;Loneliness;Isolation;Suspiciousness;Worthlessness  Eye Contact Avoids  Facial Expression Flat  Affect Depressed;Preoccupied;Flat  Speech Soft  Interaction Other (Comment) (Unable to assess.)  Motor Activity Other (Comment) (WNL)  Appearance/Hygiene In scrubs  Behavior Characteristics Cooperative;Calm  Mood Depressed;Despair;Helpless;Preoccupied  Thought Process  Coherency Disorganized  Content Preoccupation  Delusions None reported or observed  Perception WDL  Hallucination None reported or observed  Judgment Poor  Confusion None  Danger to Self  Current suicidal ideation? Passive (Denies currently.)  Agreement Not to Harm Self Yes  Description of Agreement Notify Staff  Danger to Others  Danger to Others None reported or observed

## 2024-07-22 NOTE — BH IP Treatment Plan (Signed)
 Interdisciplinary Treatment and Diagnostic Plan Update  07/22/2024 Time of Session: 10:30 AM Brandy Hogan MRN: [id number]  Principal Diagnosis: Major depressive disorder, recurrent episode, severe (HCC)  Secondary Diagnoses: Principal Problem:   Major depressive disorder, recurrent episode, severe (HCC) Active Problems:   Obsessive-compulsive disorder, unspecified type   Generalized anxiety disorder   Current Medications:  Current Facility-Administered Medications  Medication Dose Route Frequency Provider Last Rate Last Admin   acetaminophen  (TYLENOL ) tablet 650 mg  650 mg Oral Q6H PRN Nwoko, Mac I, NP       alum & mag hydroxide-simeth (MAALOX/MYLANTA) 200-200-20 MG/5ML suspension 30 mL  30 mL Oral Q4H PRN Nwoko, Agnes I, NP       hydrOXYzine  (ATARAX ) tablet 25 mg  25 mg Oral TID PRN Collene Mac I, NP       magnesium  hydroxide (MILK OF MAGNESIA) suspension 30 mL  30 mL Oral Daily PRN Nwoko, Agnes I, NP       OLANZapine  (ZYPREXA ) injection 5 mg  5 mg Intramuscular TID PRN Onuoha, Chinwendu V, NP       OLANZapine  zydis (ZYPREXA ) disintegrating tablet 5 mg  5 mg Oral TID PRN Onuoha, Chinwendu V, NP       QUEtiapine  (SEROQUEL ) tablet 100 mg  100 mg Oral QHS Nwoko, Agnes I, NP       sertraline  (ZOLOFT ) tablet 50 mg  50 mg Oral Daily Nwoko, Agnes I, NP       traZODone  (DESYREL ) tablet 50 mg  50 mg Oral QHS PRN Nwoko, Agnes I, NP       PTA Medications: Medications Prior to Admission  Medication Sig Dispense Refill Last Dose/Taking   albuterol  (VENTOLIN  HFA) 108 (90 Base) MCG/ACT inhaler Inhale into the lungs every 6 (six) hours as needed for wheezing or shortness of breath.   Past Month   cetirizine (ZYRTEC) 10 MG tablet Take 10 mg by mouth daily as needed for allergies.   Past Month   diphenhydrAMINE  (BENADRYL ) 25 mg capsule Take 25 mg by mouth every 6 (six) hours as needed for allergies.   Past Month   hydrOXYzine  (VISTARIL ) 25 MG capsule Take 1 capsule (25 mg total) by mouth at  bedtime. May take one extra as needed. 60 capsule 0 Past Week   Multiple Vitamins-Minerals (ADULT GUMMY PO) Take 2 tablets by mouth daily.   Past Week    Patient Stressors: Other: Stressors are uncertainty.    Patient Strengths: Ability for insight  Motivation for treatment/growth  Physical Health  Special hobby/interest   Treatment Modalities: Medication Management, Group therapy, Case management,  1 to 1 session with clinician, Psychoeducation, Recreational therapy.   Physician Treatment Plan for Primary Diagnosis: Major depressive disorder, recurrent episode, severe (HCC) Long Term Goal(s): Improvement in symptoms so as ready for discharge   Short Term Goals: Ability to identify and develop effective coping behaviors will improve Ability to maintain clinical measurements within normal limits will improve Compliance with prescribed medications will improve Ability to identify triggers associated with substance abuse/mental health issues will improve Ability to verbalize feelings will improve Ability to disclose and discuss suicidal ideas Ability to demonstrate self-control will improve  Medication Management: Evaluate patient's response, side effects, and tolerance of medication regimen.  Therapeutic Interventions: 1 to 1 sessions, Unit Group sessions and Medication administration.  Evaluation of Outcomes: Not Progressing  Physician Treatment Plan for Secondary Diagnosis: Principal Problem:   Major depressive disorder, recurrent episode, severe (HCC) Active Problems:   Obsessive-compulsive disorder, unspecified type  Generalized anxiety disorder  Long Term Goal(s): Improvement in symptoms so as ready for discharge   Short Term Goals: Ability to identify and develop effective coping behaviors will improve Ability to maintain clinical measurements within normal limits will improve Compliance with prescribed medications will improve Ability to identify triggers associated  with substance abuse/mental health issues will improve Ability to verbalize feelings will improve Ability to disclose and discuss suicidal ideas Ability to demonstrate self-control will improve     Medication Management: Evaluate patient's response, side effects, and tolerance of medication regimen.  Therapeutic Interventions: 1 to 1 sessions, Unit Group sessions and Medication administration.  Evaluation of Outcomes: Not Progressing   RN Treatment Plan for Primary Diagnosis: Major depressive disorder, recurrent episode, severe (HCC) Long Term Goal(s): Knowledge of disease and therapeutic regimen to maintain health will improve  Short Term Goals: Ability to remain free from injury will improve, Ability to verbalize frustration and anger appropriately will improve, Ability to demonstrate self-control, Ability to participate in decision making will improve, Ability to verbalize feelings will improve, Ability to disclose and discuss suicidal ideas, Ability to identify and develop effective coping behaviors will improve, and Compliance with prescribed medications will improve  Medication Management: RN will administer medications as ordered by provider, will assess and evaluate patient's response and provide education to patient for prescribed medication. RN will report any adverse and/or side effects to prescribing provider.  Therapeutic Interventions: 1 on 1 counseling sessions, Psychoeducation, Medication administration, Evaluate responses to treatment, Monitor vital signs and CBGs as ordered, Perform/monitor CIWA, COWS, AIMS and Fall Risk screenings as ordered, Perform wound care treatments as ordered.  Evaluation of Outcomes: Not Progressing   LCSW Treatment Plan for Primary Diagnosis: Major depressive disorder, recurrent episode, severe (HCC) Long Term Goal(s): Safe transition to appropriate next level of care at discharge, Engage patient in therapeutic group addressing interpersonal  concerns.  Short Term Goals: Engage patient in aftercare planning with referrals and resources, Increase social support, Increase ability to appropriately verbalize feelings, Increase emotional regulation, Facilitate acceptance of mental health diagnosis and concerns, Facilitate patient progression through stages of change regarding substance use diagnoses and concerns, Identify triggers associated with mental health/substance abuse issues, and Increase skills for wellness and recovery  Therapeutic Interventions: Assess for all discharge needs, 1 to 1 time with Social worker, Explore available resources and support systems, Assess for adequacy in community support network, Educate family and significant other(s) on suicide prevention, Complete Psychosocial Assessment, Interpersonal group therapy.  Evaluation of Outcomes: Not Progressing   Progress in Treatment: Attending groups: No. Participating in groups: No. Taking medication as prescribed: patient hasn't been prescribed any medications yet Toleration medication: N/A Family/Significant other contact made: consents are pending Patient understands diagnosis: Yes. Discussing patient identified problems/goals with staff: Yes. Medical problems stabilized or resolved: Yes. Denies suicidal/homicidal ideation: Yes. Issues/concerns per patient self-inventory: No.  New problem(s) identified:  No  New Short Term/Long Term Goal(s):    medication stabilization, elimination of SI thoughts, development of comprehensive mental wellness plan.   Patient Goals:  I have trouble sleeping.  I didn't sleep last night.  A few days ago, I only slept for a few hours.  Discharge Plan or Barriers:  Patient recently admitted. CSW will continue to follow and assess for appropriate referrals and possible discharge planning.   Reason for Continuation of Hospitalization: Anxiety Depression Medication stabilization Suicidal ideation  Estimated Length of Stay:   5 - 7 days  Last 3 [place] Suicide Severity Risk Score: Flowsheet  Row [facility]Current[facility]21/20[facility] in [facility] from [facility]15/2023 in [facility]  C-SSRS RISK CATEGORY Low Risk Error: Q3, 4, or 5 should not be populated when Q2 is No No Risk    Last PHQ 2/9 Scores:    11/07/2023   12:12 PM  Depression screen PHQ 2/9  Decreased Interest 3  Down, Depressed, Hopeless 2  PHQ - 2 Score 5  Altered sleeping 2  Tired, decreased energy 2  Change in appetite 3  Feeling bad or failure about yourself  3  Trouble concentrating 3  Moving slowly or fidgety/restless 1  Suicidal thoughts 1  PHQ-9 Score 20  Difficult doing work/chores Somewhat difficult    Scribe for Treatment Team: Geneive Sandstrom O Sanford Lindblad, LCSWA 07/22/2024 3:27 PM

## 2024-07-23 DIAGNOSIS — F333 Major depressive disorder, recurrent, severe with psychotic symptoms: Secondary | ICD-10-CM | POA: Diagnosis not present

## 2024-07-23 DIAGNOSIS — F429 Obsessive-compulsive disorder, unspecified: Secondary | ICD-10-CM | POA: Diagnosis not present

## 2024-07-23 DIAGNOSIS — F411 Generalized anxiety disorder: Secondary | ICD-10-CM | POA: Diagnosis not present

## 2024-07-23 LAB — RPR: RPR Ser Ql: NONREACTIVE

## 2024-07-23 MED ORDER — QUETIAPINE FUMARATE 50 MG PO TABS
150.0000 mg | ORAL_TABLET | Freq: Every day | ORAL | Status: DC
  Administered 2024-07-23 – 2024-07-24 (×2): 150 mg via ORAL
  Filled 2024-07-23 (×2): qty 3

## 2024-07-23 MED ORDER — QUETIAPINE FUMARATE 50 MG PO TABS
50.0000 mg | ORAL_TABLET | Freq: Once | ORAL | Status: AC
  Administered 2024-07-23: 50 mg via ORAL
  Filled 2024-07-23: qty 1

## 2024-07-23 MED ORDER — WHITE PETROLATUM EX OINT
TOPICAL_OINTMENT | CUTANEOUS | Status: AC
  Filled 2024-07-23: qty 5
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
## 2024-07-23 NOTE — Group Note (Signed)
 LCSW Group Therapy Note  Group Date: 07/23/2024 Start Time: 1000 End Time: 1100   Type of Therapy and Topic:  Group Therapy: LCSW Group Therapy Note  @TD @ 1:15pm  Type of Therapy and Topic:  Group Therapy - Safety  Participation Level:  Did Not Attend   Description of Group This process group involved patients discussing the situations or people in their lives that frequently make them safe or unsafe.  Anxiety was a common factor among all group participants and many of them described home situations that keep them on edge and not able to feel completely safe.  Three questions were addressed during the group:  (1) What makes you feel safe (or unsafe)?  (2) Do you feel safe with yourself and why?  (3) If you don't feel safe, what can you do?  A lengthy discussion ensued in which group members empathized with each other, gave suggestions to one another, and expressed their feelings freely.  Therapeutic Goals Patient will describe what makes them feel safe or unsafe in their everyday lives. Patient will think about and discuss whether they feel safe with themselves and what reasons might contribute to feeling safe or unsafe. Patients will participate in planning for what can be done to help themselves feel safer.   Summary of Patient Progress:  NA   Therapeutic Modalities Cognitive Behavioral Therapy  Participation Level:  Did Not Attend   Description of Group:   This group addressed positive affirmation towards self and others.  Patients went around the room and identified two positive things about themselves and two positive things about a peer in the room.  Patients reflected on how it felt to share something positive with others, to identify positive things about themselves, and to hear positive things from others/ Patients were encouraged to have a daily reflection of positive characteristics or circumstances.   Therapeutic Goals: Patients will verbalize two of their positive  qualities Patients will demonstrate empathy for others by stating two positive qualities about a peer in the group Patients will verbalize their feelings when voicing positive self affirmations and when voicing positive affirmations of others Patients will discuss the potential positive impact on their wellness/recovery of focusing on positive traits of self and others.  Summary of Patient Progress: NA  Therapeutic Modalities:   Cognitive Behavioral Therapy Motivational Interviewing    Brandy Hogan O Larnie Heart, LCSWA 07/23/2024  3:16 PM

## 2024-07-23 NOTE — Progress Notes (Signed)
 D: Patient is alert and cooperative. Denies SI, HI, and AVH. Patient thought blocking. Patient talking to her self saying stop and no. Eye contact is very brief. Patient seems mildly confused.    A: Scheduled medications administered per MD order. Support provided. Patient educated on safety on the unit and medications. Routine safety checks every 15 minutes. Patient stated understanding to tell nurse about any new physical symptoms. Patient understands to tell staff of any needs.     R: No adverse drug reactions noted. Patient remains safe at this time and will continue to monitor.    07/23/24 1300  Psych Admission Type (Psych Patients Only)  Admission Status Voluntary  Psychosocial Assessment  Patient Complaints Disorientation;Depression  Eye Contact Brief  Facial Expression Flat  Affect Depressed;Flat  Speech Soft  Interaction Poor  Motor Activity Other (Comment) (WNL)  Appearance/Hygiene Unremarkable  Behavior Characteristics Cooperative;Resistant to care  Mood Preoccupied;Depressed  Thought Process  Coherency Disorganized  Content Preoccupation  Delusions None reported or observed  Perception Hallucinations  Hallucination Auditory  Judgment Poor  Confusion Mild  Danger to Self  Current suicidal ideation? Denies  Agreement Not to Harm Self Yes  Description of Agreement verbal  Danger to Others  Danger to Others None reported or observed

## 2024-07-23 NOTE — BH Assessment (Signed)
(  Sleep Hours) - (Any PRNs that were needed, meds refused, or side effects to meds)- None (Any disturbances and when (visitation, over night)- None (Concerns raised by the patient)- Pt did not sleep all night. Pt would softly talk to her self all night. (SI/HI/AVH)-

## 2024-07-23 NOTE — Group Note (Signed)
 Date:  07/23/2024 Time:  5:23 PM  Group Topic/Focus:  Dimensions of Wellness:   The focus of this group is to introduce the topic of wellness and using collage as a creative outlet for expressing emotions, reducing anxiety, fostering group cohesion and enabling personal insight and healing. .    Participation Level:  Did Not Attend  Brandy Hogan 07/23/2024, 5:23 PM

## 2024-07-23 NOTE — Plan of Care (Signed)

## 2024-07-23 NOTE — Group Note (Signed)
 Date:  07/23/2024 Time:  9:18 AM  Group Topic/Focus:  Goals Group:   The focus of this group is to help patients establish daily goals to achieve during treatment and discuss how the patient can incorporate goal setting into their daily lives to aide in recovery.    Participation Level:  Did Not Attend  Participation Quality:    Affect:    Cognitive:    Insight:   Engagement in Group:    Modes of Intervention:    Additional Comments:  Pt did not attend group.  Cassius LOISE Dawn 07/23/2024, 9:18 AM

## 2024-07-23 NOTE — Plan of Care (Signed)

## 2024-07-23 NOTE — Progress Notes (Signed)
 Pt is now yelling out stop! stop! while lying down awake in bed. RN notified and attempted to speak to the pt, with minimal response from pt.

## 2024-07-23 NOTE — Group Note (Deleted)
 Date:  07/23/2024 Time:  9:28 PM PT did not attend group.  Margaret Staggs L 07/23/2024, 9:28 PM

## 2024-07-23 NOTE — B[facility] Group Notes (Signed)
 B[facility] Group Notes:  (Nursing/MHT/Case Management/Adjunct)  Date:  07/23/2024  Time:  2000  Type of Therapy:  Wrap up group  Participation Level:  Active  Participation Quality:  Appropriate, Attentive, Sharing, and Supportive  Affect:  Blunted  Cognitive:  Alert  Insight:  Improving  Engagement in Group:  Engaged  Modes of Intervention:  Clarification, Education, and Support  Summary of Progress/Problems: Positive thinking and positive change were discussed.   Brandy Hogan 07/23/2024, 8:31 PM

## 2024-07-23 NOTE — Progress Notes (Signed)
 [facility] MD Progress Note  07/23/2024 10:29 AM Brandy Hogan  MRN:  [id number]  Patient is a  58y.o. female who presents to the [facility] unit due to psychosis.  Interval History Patient was seen today for re-evaluation.  Nursing reports no events overnight. The patient has no issues with performing ADLs.  Patient has been medication compliant.    Patient was seen and interviewed by attending psychiatrist. Chart reviewed. Patient discussed during treatment team rounds.  Subjective:   On assessment, the patient stated, "I don't know how I am feeling." She reported having "negative thoughts" and described feeling as though "something bad may happen," which contributes to her anxiety and low mood. It was noted that she has been frequently saying "stop" to herself and listening to something others cannot hear. She described hearing a "clicking sound in my head," which she clarified is "not loud" and occurs intermittently. She denied hearing voices and denied experiencing visual hallucinations. The patient reported feeling safe in the hospital but expressed anxiety that something bad could happen to her family, though she was unable to identify any specific reason for this fear. She denied any thoughts or plans of harming herself or others. She reports I slept some last night. She reported no side effects from her current medications and no physical complaints.  During the assessment, she appeared noticeably internally preoccupied and distressed by the sounds she was perceiving. She was offered an additional one-time dose of Seroquel  as well as an increase in her nighttime dose, both of which she accepted.  Subjective:   Principal Problem: Major depressive disorder, recurrent episode, severe (HCC) Diagnosis: Principal Problem:   Major depressive disorder, recurrent episode, severe (HCC) Active Problems:   Obsessive-compulsive disorder, unspecified type   Generalized anxiety disorder  Total Time  spent with patient: 30 minutes  Past Psychiatric History: see H&P  Past Medical History:  Past Medical History:  Diagnosis Date   Asthma    History of admission to inpatient psychiatry department 12/12/2022   [facility] 10/2022   Psychosis [facility])    History reviewed. No pertinent surgical history. Family History:  Family History  Problem Relation Age of Onset   Schizophrenia Mother    Bipolar disorder Sister    Family Psychiatric  History: see H&P Social History:  Social History   Substance and Sexual Activity  Alcohol Use Never     Social History   Substance and Sexual Activity  Drug Use Never    Social History   Socioeconomic History   Marital status: Married    Spouse name: Not on file   Number of children: Not on file   Years of education: Not on file   Highest education level: Not on file  Occupational History   Not on file  Tobacco Use   Smoking status: Never   Smokeless tobacco: Never  Substance and Sexual Activity   Alcohol use: Never   Drug use: Never   Sexual activity: Yes  Other Topics Concern   Not on file  Social History Narrative   Not on file   Social Drivers of Health   Financial Resource Strain: Not on file  Food Insecurity: No Food Insecurity (07/22/2024)   Hunger Vital Sign    Worried About Running Out of Food in the Last Year: Never true    Ran Out of Food in the Last Year: Never true  Transportation Needs: No Transportation Needs (07/22/2024)   PRAPARE - Administrator, Civil Service (Medical):  No    Lack of Transportation (Non-Medical): No  Physical Activity: Not on file  Stress: Not on file  Social Connections: Not on file   Additional Social History:                  Sleep: Fair Estimated Sleeping Duration (Last 24 Hours): 6.75-9.50 hours  Appetite:  Fair  Current Medications: Current Facility-Administered Medications  Medication Dose Route Frequency Provider Last Rate Last Admin   acetaminophen   (TYLENOL ) tablet 650 mg  650 mg Oral Q6H PRN Nwoko, Mac I, NP       alum & mag hydroxide-simeth (MAALOX/MYLANTA) 200-200-20 MG/5ML suspension 30 mL  30 mL Oral Q4H PRN Nwoko, Agnes I, NP       hydrOXYzine  (ATARAX ) tablet 25 mg  25 mg Oral TID PRN Collene Mac I, NP       magnesium  hydroxide (MILK OF MAGNESIA) suspension 30 mL  30 mL Oral Daily PRN Nwoko, Agnes I, NP       OLANZapine  (ZYPREXA ) injection 5 mg  5 mg Intramuscular TID PRN Onuoha, Chinwendu V, NP       OLANZapine  zydis (ZYPREXA ) disintegrating tablet 5 mg  5 mg Oral TID PRN Onuoha, Chinwendu V, NP       QUEtiapine  (SEROQUEL ) tablet 100 mg  100 mg Oral QHS Nwoko, Agnes I, NP   100 mg at 07/22/24 2059   QUEtiapine  (SEROQUEL ) tablet 50 mg  50 mg Oral Once Roby Spalla, MD       sertraline  (ZOLOFT ) tablet 50 mg  50 mg Oral Daily Nwoko, Agnes I, NP   50 mg at 07/23/24 0746   traZODone  (DESYREL ) tablet 50 mg  50 mg Oral QHS PRN Collene Mac I, NP        Lab Results:  Results for orders placed or performed during the hospital encounter of 07/22/24 (from the past 48 hours)  Folate     Status: None   Collection Time: 07/22/24  6:52 PM  Result Value Ref Range   Folate 21.7 >5.9 ng/mL    Comment: Performed at [facility], [address], [place], [place] [address]  VITAMIN D  25 Hydroxy (Vit-D Deficiency, Fractures)     Status: None   Collection Time: 07/22/24  6:52 PM  Result Value Ref Range   Vit D, 25-Hydroxy 49.17 30 - 100 ng/mL    Comment: (NOTE) Vitamin D  deficiency has been defined by the Institute of Medicine  and an Endocrine Society practice guideline as a level of serum 25-OH  vitamin D  less than 20 ng/mL (1,2). The Endocrine Society went on to  further define vitamin D  insufficiency as a level between 21 and 29  ng/mL (2).  1. IOM (Institute of Medicine). 2010. Dietary reference intakes for  calcium and D. [place]: [employer]. 2. Holick MF, Binkley [place], Bischoff-Ferrari HA, et  al. Evaluation,  treatment, and prevention of vitamin D  deficiency: an Endocrine  Society clinical practice guideline, JCEM. 2011 Jul; 96(7): 1911-30.  Performed at  [facility], [address], [place], [place] [address]   HIV Antibody (routine testing w rflx)     Status: None   Collection Time: 07/22/24  6:52 PM  Result Value Ref Range   HIV Screen 4th Generation wRfx Non Reactive Non Reactive    Comment: Performed at [facility], [address], [place], [place] [address]    Blood Alcohol level:  Lab Results  Component Value Date  ETH <15 07/21/2024   ETH <10 12/15/2023    Metabolic Disorder Labs: Lab Results  Component Value Date   HGBA1C 5.6 07/21/2024   MPG 114 07/21/2024   MPG 123 12/15/2023   Lab Results  Component Value Date   PROLACTIN 12.7 11/07/2022   Lab Results  Component Value Date   CHOL 254 (H) 07/21/2024   TRIG 54 07/21/2024   HDL 109 07/21/2024   CHOLHDL 2.3 07/21/2024   VLDL 11 07/21/2024   LDLCALC 134 (H) 07/21/2024   LDLCALC 172 (H) 12/15/2023    Physical Findings: AIMS:  ,  ,  ,  ,  ,  ,   CIWA:    COWS:     Musculoskeletal: Strength & Muscle Tone: within normal limits Gait & Station: normal Patient leans: N/A  Psychiatric Specialty Exam:  Appearance:  AAF, appearing stated age,  wearing appropriate to the situation hospital clothes, with decreased grooming and ok hygiene. Normal level of alertness and sad facial expression.  Attitude/Behavior: calm, cooperative, engaging with poor eye contact. Frequently says stop to something.  Motor: WNL; dyskinesias not evident.  Speech: soft, low volume, spontaneous, clear, coherent, normal comprehension.  Mood: dysthymic, I don`t know.  Affect: flat.  Thought process: patient appears coherent, mostly-organized, linear with questions, although with delayed response ( due to thought blocking? or hallucinations?)   Thought content: patient denies suicidal thoughts, denies  homicidal thoughts; expresses paranoid delusions.  Thought perception: patient reports auditory hallucinations of clicking sound in her head and appears internally stimulated during the interview.  Cognition: patient is alert and oriented in self, place.  Insight: limited  Judgement: questionable  Assets  Assets: Manufacturing systems engineer; Desire for Improvement; Housing; Social Support   Sleep  Sleep: Sleep: Poor Number of Hours of Sleep: 4    Physical Exam: Physical Exam ROS Blood pressure 116/83, pulse (!) 107, temperature 97.8 F (36.6 C), temperature source Oral, resp. rate 14, height 4' 11.75 (1.518 m), weight 49 kg, last menstrual period 09/08/2012, SpO2 100%. Body mass index is 21.27 kg/m.   Treatment Plan Summary: Daily contact with patient to assess and evaluate symptoms and progress in treatment and Medication management  Patient is a 58 year old female with the above-stated past psychiatric history who is seen in follow-up.  Chart reviewed. Patient discussed with nursing. Patient appears depressed and internally-stimulated. Will increase the dose of Seroquel  tonight.  Diagnoses/ Active problems: -MDD with psychotic features -r/o psychotic disorder   PLAN:  Safety and Monitoring: continue inpatient psych admission; 15-minute checks; daily contact with patient to assess and evaluate symptoms and progress in treatment; psychoeducation.Vital signs: q12 hours. Precautions: suicide, elopement, and assault. Placed on room lock out for meals, snacks and groups.  Psychiatric Problems: - increase Seroquel  to 150mg  PO at bedtime for psychosis, sleep, mood. - continue Sertraline  50mg  PO daily for depression, anxiety.  PRN medications: acetaminophen , alum & mag hydroxide-simeth, hydrOXYzine , magnesium  hydroxide, OLANZapine , OLANZapine  zydis, traZODone     Pertinent Labs: no new labs ordered today  Consults: No new consults placed since yesterday    Discharge  Planning: -Social work and case management to assist with discharge planning and identification of hospital follow-up needs prior to discharge -Estimated LOS: 7 days -Discharge Concerns: Need to establish a safety plan; Medication compliance and effectiveness -Discharge Goals: Return home with outpatient referrals for mental health follow-up including medication management/psychotherapy  Total Time Spent in Direct Patient Care:  I personally spent 35 minutes on the unit in direct patient care. The direct  patient care time included face-to-face time with the patient, reviewing the patient's chart, communicating with other professionals, and coordinating care. Greater than 50% of this time was spent in counseling or coordinating care with the patient regarding goals of hospitalization, psycho-education, and discharge planning needs.   Neil Appl, MD 07/23/2024, 10:29 AM

## 2024-07-24 DIAGNOSIS — F411 Generalized anxiety disorder: Secondary | ICD-10-CM | POA: Diagnosis not present

## 2024-07-24 DIAGNOSIS — F429 Obsessive-compulsive disorder, unspecified: Secondary | ICD-10-CM | POA: Diagnosis not present

## 2024-07-24 DIAGNOSIS — F333 Major depressive disorder, recurrent, severe with psychotic symptoms: Secondary | ICD-10-CM | POA: Diagnosis not present

## 2024-07-24 NOTE — Progress Notes (Signed)
 Adult Psychoeducational Group Note  Date:  07/24/2024 Time:  10:23 AM  Group Topic/Focus:  Goals Group:   The focus of this group is to help patients establish daily goals to achieve during treatment and discuss how the patient can incorporate goal setting into their daily lives to aide in recovery.  Participation Level:  Active  Participation Quality:  Appropriate  Affect:  Appropriate  Cognitive:  Appropriate  Insight: Appropriate  Engagement in Group:  Engaged  Modes of Intervention:  Discussion  Additional Comments:  Pt is currently feeling better.  Pt goal for the day is to get out the room more today.  Daine Pillar D 07/24/2024, 10:23 AM

## 2024-07-24 NOTE — [facility] Group Notes (Signed)
 [facility] Group Notes:  (Nursing/MHT/Case Management/Adjunct)  Date:  07/24/2024  Time:  8:39 PM  Type of Therapy:  Wrap-up group  Participation Level:  Active  Participation Quality:  Appropriate  Affect:  Appropriate  Cognitive:  Appropriate  Insight:  Appropriate  Engagement in Group:  Engaged  Modes of Intervention:  Exploration  Summary of Progress/Problems: PT goal to stay out of her room. Met goal, rated day 6/10.  Grayce LITTIE Essex 07/24/2024, 8:39 PM

## 2024-07-24 NOTE — Progress Notes (Signed)
(  Sleep Hours) - 6.75 (Any PRNs that were needed, meds refused, or side effects to meds)-  PRN Hydroxyzine  and PRN Trazodone  (Any disturbances and when (visitation, over night)- None (Concerns raised by the patient)- None (SI/HI/AVH)- Endorses AH of clicking sounds. Denies SI, contracts for safety. Denies VH/HI.

## 2024-07-24 NOTE — Plan of Care (Signed)

## 2024-07-24 NOTE — Progress Notes (Signed)
 [facility] MD Progress Note  07/24/2024 12:41 PM Brandy Hogan  MRN:  [id number]  Patient is a  58y.o. female who presents to the [facility] unit due to psychosis.  Interval History Patient was seen today for re-evaluation.  Nursing reports no events overnight. The patient has no issues with performing ADLs.  Patient has been medication compliant.    Patient was seen and interviewed by attending psychiatrist. Chart reviewed. Patient discussed during treatment team rounds.  Subjective:   On assessment, the patient stated, "I think I am feeling better." She reported having "no negative thoughts today". Reports she slept well. She reports better mood - denies feeling depressed, reports feeling less anxious. She still reports hearing a "clicking sound" in her head, although says it`s calmer. She denied hearing voices and denied experiencing visual hallucinations. She denied any thoughts or plans of harming herself or others. She reported no side effects from her current medications and no physical complaints.  During the assessment, she appeared much calmer than yesterday, not talking to self, although answers questions with some delay still.  Subjective:   Principal Problem: Major depressive disorder, recurrent episode, severe (HCC) Diagnosis: Principal Problem:   Major depressive disorder, recurrent episode, severe (HCC) Active Problems:   Obsessive-compulsive disorder, unspecified type   Generalized anxiety disorder  Total Time spent with patient: 30 minutes  Past Psychiatric History: see H&P  Past Medical History:  Past Medical History:  Diagnosis Date   Asthma    History of admission to inpatient psychiatry department 12/12/2022   [facility] 10/2022   Psychosis [facility])    History reviewed. No pertinent surgical history. Family History:  Family History  Problem Relation Age of Onset   Schizophrenia Mother    Bipolar disorder Sister    Family Psychiatric  History: see H&P Social  History:  Social History   Substance and Sexual Activity  Alcohol Use Never     Social History   Substance and Sexual Activity  Drug Use Never    Social History   Socioeconomic History   Marital status: Married    Spouse name: Not on file   Number of children: Not on file   Years of education: Not on file   Highest education level: Not on file  Occupational History   Not on file  Tobacco Use   Smoking status: Never   Smokeless tobacco: Never  Substance and Sexual Activity   Alcohol use: Never   Drug use: Never   Sexual activity: Yes  Other Topics Concern   Not on file  Social History Narrative   Not on file   Social Drivers of Health   Financial Resource Strain: Not on file  Food Insecurity: No Food Insecurity (07/22/2024)   Hunger Vital Sign    Worried About Running Out of Food in the Last Year: Never true    Ran Out of Food in the Last Year: Never true  Transportation Needs: No Transportation Needs (07/22/2024)   PRAPARE - Administrator, Civil Service (Medical): No    Lack of Transportation (Non-Medical): No  Physical Activity: Not on file  Stress: Not on file  Social Connections: Not on file   Additional Social History:                  Sleep: Fair Estimated Sleeping Duration (Last 24 Hours): 4.75-6.00 hours  Appetite:  Fair  Current Medications: Current Facility-Administered Medications  Medication Dose Route Frequency Provider Last Rate Last Admin  acetaminophen  (TYLENOL ) tablet 650 mg  650 mg Oral Q6H PRN Collene Gouge I, NP       alum & mag hydroxide-simeth (MAALOX/MYLANTA) 200-200-20 MG/5ML suspension 30 mL  30 mL Oral Q4H PRN Collene Gouge I, NP       hydrOXYzine  (ATARAX ) tablet 25 mg  25 mg Oral TID PRN Collene Gouge I, NP   25 mg at 07/23/24 2238   magnesium  hydroxide (MILK OF MAGNESIA) suspension 30 mL  30 mL Oral Daily PRN Collene Gouge I, NP       OLANZapine  (ZYPREXA ) injection 5 mg  5 mg Intramuscular TID PRN Onuoha, Chinwendu  V, NP       OLANZapine  zydis (ZYPREXA ) disintegrating tablet 5 mg  5 mg Oral TID PRN Onuoha, Chinwendu V, NP       QUEtiapine  (SEROQUEL ) tablet 150 mg  150 mg Oral QHS Devra Stare, MD   150 mg at 07/23/24 2105   sertraline  (ZOLOFT ) tablet 50 mg  50 mg Oral Daily Nwoko, Gouge I, NP   50 mg at 07/24/24 0805   traZODone  (DESYREL ) tablet 50 mg  50 mg Oral QHS PRN Collene Gouge I, NP   50 mg at 07/23/24 2238    Lab Results:  Results for orders placed or performed during the hospital encounter of 07/22/24 (from the past 48 hours)  Folate     Status: None   Collection Time: 07/22/24  6:52 PM  Result Value Ref Range   Folate 21.7 >5.9 ng/mL    Comment: Performed at [facility], [address] [place], [place] [address]  RPR     Status: None   Collection Time: 07/22/24  6:52 PM  Result Value Ref Range   RPR Ser Ql NON REACTIVE NON REACTIVE    Comment: Performed at [facility], [address] [place], [place] [address]  VITAMIN D  25 Hydroxy (Vit-D Deficiency, Fractures)     Status: None   Collection Time: 07/22/24  6:52 PM  Result Value Ref Range   Vit D, 25-Hydroxy 49.17 30 - 100 ng/mL    Comment: (NOTE) Vitamin D  deficiency has been defined by the Institute of Medicine  and an Endocrine Society practice guideline as a level of serum 25-OH  vitamin D  less than 20 ng/mL (1,2). The Endocrine Society went on to  further define vitamin D  insufficiency as a level between 21 and 29  ng/mL (2).  1. IOM (Institute of Medicine). 2010. Dietary reference intakes for  calcium and D. [place]: [employer]. 2. Holick MF, Binkley [place], Bischoff-Ferrari HA, et al. Evaluation,  treatment, and prevention of vitamin D  deficiency: an Endocrine  Society clinical practice guideline, JCEM. 2011 Jul; 96(7): 1911-30.  Performed at [facility], [address] [place], [place] [address]   HIV Antibody (routine testing w rflx)     Status: None   Collection  Time: 07/22/24  6:52 PM  Result Value Ref Range   HIV Screen 4th Generation wRfx Non Reactive Non Reactive    Comment: Performed at [facility], [address] [place], [place] [address]    Blood Alcohol level:  Lab Results  Component Value Date   [facility] <15 07/21/2024   ETH <10 12/15/2023    Metabolic Disorder Labs: Lab Results  Component Value Date   HGBA1C 5.6 07/21/2024   MPG 114 07/21/2024   MPG 123 12/15/2023   Lab Results  Component Value Date   PROLACTIN 12.7  11/07/2022   Lab Results  Component Value Date   CHOL 254 (H) 07/21/2024   TRIG 54 07/21/2024   HDL 109 07/21/2024   CHOLHDL 2.3 07/21/2024   VLDL 11 07/21/2024   LDLCALC 134 (H) 07/21/2024   LDLCALC 172 (H) 12/15/2023    Physical Findings: AIMS:  ,  ,  ,  ,  ,  ,   CIWA:    COWS:     Musculoskeletal: Strength & Muscle Tone: within normal limits Gait & Station: normal Patient leans: N/A  Psychiatric Specialty Exam:  Appearance:  AAF, appearing stated age,  wearing appropriate to the situation hospital clothes, with decreased grooming and ok hygiene. Normal level of alertness and sad facial expression.  Attitude/Behavior: calm, cooperative, engaging with poor eye contact. Frequently says stop to something.  Motor: WNL; dyskinesias not evident.  Speech: soft, low volume, spontaneous, clear, coherent, normal comprehension.  Mood: dysthymic, I don`t know.  Affect: flat.  Thought process: patient appears coherent, mostly-organized, linear with questions, although with delayed response ( due to thought blocking? or hallucinations?)   Thought content: patient denies suicidal thoughts, denies homicidal thoughts; expresses paranoid delusions.  Thought perception: patient reports auditory hallucinations of clicking sound in her head and appears internally stimulated during the interview.  Cognition: patient is alert and oriented in self, place.  Insight: limited  Judgement:  questionable  Assets  Assets: Manufacturing systems engineer; Desire for Improvement; Housing; Social Support   Sleep  Sleep: No data recorded    Physical Exam: Physical Exam ROS Blood pressure 117/60, pulse 92, temperature 97.7 F (36.5 C), temperature source Oral, resp. rate 16, height 4' 11.75 (1.518 m), weight 49 kg, last menstrual period 09/08/2012, SpO2 100%. Body mass index is 21.27 kg/m.   Treatment Plan Summary: Daily contact with patient to assess and evaluate symptoms and progress in treatment and Medication management  Patient is a 58 year old female with the above-stated past psychiatric history who is seen in follow-up.  Chart reviewed. Patient discussed with nursing. Patient appears less depressed and less internally-stimulated today after the dose of Seroquel  was adjusted.  Diagnoses/ Active problems: -MDD with psychotic features -r/o psychotic disorder   PLAN:  Safety and Monitoring: continue inpatient psych admission; 15-minute checks; daily contact with patient to assess and evaluate symptoms and progress in treatment; psychoeducation.Vital signs: q12 hours. Precautions: suicide, elopement, and assault. Placed on room lock out for meals, snacks and groups.  Psychiatric Problems: - continue Seroquel  150mg  PO at bedtime for psychosis, sleep, mood.  PRN medications: acetaminophen , alum & mag hydroxide-simeth, hydrOXYzine , magnesium  hydroxide, OLANZapine , OLANZapine  zydis, traZODone     Pertinent Labs: no new labs ordered today  Consults: No new consults placed since yesterday    Discharge Planning: -Social work and case management to assist with discharge planning and identification of hospital follow-up needs prior to discharge -Estimated LOS: 7 days -Discharge Concerns: Need to establish a safety plan; Medication compliance and effectiveness -Discharge Goals: Return home with outpatient referrals for mental health follow-up including medication  management/psychotherapy  Total Time Spent in Direct Patient Care:  I personally spent 35 minutes on the unit in direct patient care. The direct patient care time included face-to-face time with the patient, reviewing the patient's chart, communicating with other professionals, and coordinating care. Greater than 50% of this time was spent in counseling or coordinating care with the patient regarding goals of hospitalization, psycho-education, and discharge planning needs.   Neil Appl, MD 07/24/2024, 12:41 PM

## 2024-07-24 NOTE — Plan of Care (Signed)
   Problem: Safety: Goal: Periods of time without injury will increase Outcome: Progressing

## 2024-07-24 NOTE — Progress Notes (Signed)
 D: Patient is alert and cooperative. Endorses hearing clicking sounds. Denies SI, HI, VH, and verbally contracts for safety. Patient reports she slept good last night with sleeping medication. Patient reports her appetite as fair, energy level as normal, and concentration as good. Patient denies physical symptoms/pain. Patient is still somewhat slow to respond.   A: Scheduled medications administered per MD order. Support provided. Patient educated on safety on the unit and medications. Routine safety checks every 15 minutes. Patient stated understanding to tell nurse about any new physical symptoms. Patient understands to tell staff of any needs.     R: No adverse drug reactions noted. Patient remains safe at this time and will continue to monitor.    07/24/24 0900  Psych Admission Type (Psych Patients Only)  Admission Status Voluntary  Psychosocial Assessment  Patient Complaints Depression;Disorientation  Eye Contact Brief  Facial Expression Flat  Affect Apprehensive  Speech Soft  Interaction Cautious  Motor Activity Fidgety  Appearance/Hygiene Unremarkable  Behavior Characteristics Cooperative  Mood Depressed;Preoccupied  Thought Process  Coherency Blocking  Content Preoccupation  Delusions None reported or observed  Perception Hallucinations  Hallucination Auditory  Judgment Poor  Confusion Mild  Danger to Self  Current suicidal ideation? Denies  Agreement Not to Harm Self Yes  Description of Agreement verbal  Danger to Others  Danger to Others None reported or observed

## 2024-07-24 NOTE — Group Note (Signed)
 Date:  07/24/2024 Time:  4:53 PM  Group Topic/Focus:  Healthy Communication:   The focus of this group is to discuss communication, barriers to communication, as well as healthy ways to communicate with others.     Participation Level:  Active  Participation Quality:  Appropriate and Attentive  Affect:  Flat  Cognitive:  Appropriate  Insight: Improving  Engagement in Group:  Improving  Modes of Intervention:  Activity, Education, Exploration, and Socialization  Additional Comments:    Berwyn GORMAN Acosta 07/24/2024, 4:53 PM

## 2024-07-25 DIAGNOSIS — F333 Major depressive disorder, recurrent, severe with psychotic symptoms: Secondary | ICD-10-CM | POA: Diagnosis not present

## 2024-07-25 MED ORDER — RISPERIDONE 1 MG PO TABS
1.0000 mg | ORAL_TABLET | Freq: Every day | ORAL | Status: DC
  Administered 2024-07-25 – 2024-07-27 (×6): 1 mg via ORAL
  Filled 2024-07-25 (×3): qty 1

## 2024-07-25 MED ORDER — ALBUTEROL SULFATE HFA 108 (90 BASE) MCG/ACT IN AERS: 1.0000 | INHALATION_SPRAY | RESPIRATORY_TRACT | Status: DC | PRN

## 2024-07-25 MED ORDER — QUETIAPINE FUMARATE 100 MG PO TABS
100.0000 mg | ORAL_TABLET | Freq: Every day | ORAL | Status: DC
  Administered 2024-07-25 (×2): 100 mg via ORAL
  Filled 2024-07-25: qty 1

## 2024-07-25 MED ORDER — ALBUTEROL SULFATE HFA 108 (90 BASE) MCG/ACT IN AERS: 1.0000 | INHALATION_SPRAY | RESPIRATORY_TRACT | Status: DC

## 2024-07-25 NOTE — Group Note (Signed)
 Date:  07/25/2024 Time:  8:55 AM  Group Topic/Focus:  Goals Group:   The focus of this group is to help patients establish daily goals to achieve during treatment and discuss how the patient can incorporate goal setting into their daily lives to aide in recovery.    Participation Level:  Minimal  Participation Quality:  Appropriate  Affect:  Appropriate  Cognitive:  Appropriate  Insight: Appropriate  Engagement in Group:  Limited  Modes of Intervention:  Discussion  Additional Comments:  Pt listened quietly throughout the duration of group.  Ceil Roderick A Ladeidra Borys 07/25/2024, 8:55 AM

## 2024-07-25 NOTE — Group Note (Signed)
 Therapy Group Note  Group Topic:Other  Group Date: 07/25/2024 Start Time: 1500 End Time: 1535 Facilitators: Dot [place] MATSU, OT    The primary objective of this topic is to explore and understand the concept of occupational balance in the context of daily living. The term occupational balance is defined broadly, encompassing all activities that occupy an individual's time and energy, including self-care, leisure, and work-related tasks. The goal is to guide participants towards achieving a harmonious blend of these activities, tailored to their personal values and life circumstances. This balance is aimed at enhancing overall well-being, not by equally distributing time across activities, but by ensuring that daily engagements are fulfilling and not draining. The content delves into identifying various barriers that individuals face in achieving occupational balance, such as overcommitment, misaligned priorities, external pressures, and lack of effective time management. The impact of these barriers on occupational performance, roles, and lifestyles is examined, highlighting issues like reduced efficiency, strained relationships, and potential health problems. Strategies for cultivating occupational balance are a key focus. These strategies include practical methods like time blocking, prioritizing tasks, establishing self-care rituals, decluttering, connecting with nature, and engaging in reflective practices. These approaches are designed to be adaptable and applicable to a wide range of life scenarios, promoting a proactive and mindful approach to daily living. The overall aim is to equip participants with the knowledge and tools to create a balanced lifestyle that supports their mental, emotional, and physical health, thereby improving their functional performance in daily life.   Arnesha Schiraldi, OT     Participation Level: Engaged   Participation Quality: Independent   Behavior:  Appropriate   Speech/Thought Process: Relevant   Affect/Mood: Appropriate   Insight: Fair   Judgement: Fair      Modes of Intervention: Education  Patient Response to Interventions:  Attentive   Plan: Continue to engage patient in OT groups 2 - 3x/week.  07/25/2024  [place] MATSU Dot, OT   Brandy Hogan, OT

## 2024-07-25 NOTE — Plan of Care (Signed)
  Problem: Education: Goal: Emotional status will improve Outcome: Progressing Goal: Verbalization of understanding the information provided will improve Outcome: Progressing   Problem: Coping: Goal: Ability to demonstrate self-control will improve Outcome: Progressing   Problem: Physical Regulation: Goal: Ability to maintain clinical measurements within normal limits will improve Outcome: Progressing

## 2024-07-25 NOTE — Progress Notes (Signed)
   07/25/24 0900  Psych Admission Type (Psych Patients Only)  Admission Status Voluntary  Psychosocial Assessment  Patient Complaints None;Nervousness  Eye Contact Fair  Facial Expression Flat  Affect Anxious  Speech Logical/coherent  Interaction Assertive  Motor Activity Fidgety  Appearance/Hygiene Unremarkable  Behavior Characteristics Cooperative;Appropriate to situation  Mood Preoccupied  Thought Process  Coherency Blocking  Content Preoccupation  Delusions None reported or observed  Perception WDL  Hallucination None reported or observed  Judgment Limited  Confusion None  Danger to Self  Current suicidal ideation? Denies  Agreement Not to Harm Self Yes  Description of Agreement verbal  Danger to Others  Danger to Others None reported or observed

## 2024-07-25 NOTE — Progress Notes (Signed)
(  Sleep Hours) - 6.75 (Any PRNs that were needed, meds refused, or side effects to meds)- none (Any disturbances and when (visitation, over night)- none (Concerns raised by the patient)- none (SI/HI/AVH)- Denies.

## 2024-07-25 NOTE — Progress Notes (Addendum)
 [facility] MD Progress Note  07/25/2024 10:00 AM Brandy Hogan  MRN:  [id number]  Reason for Admission: 58 year old AA female with prior history of mental illnesses. Patient is known in this [facility] from her previous admission in the early part of 2024. At the time, she presented with worsening psychosis. She is being re-admitted to the [facility] from the [facility] with complain of worsening insomnia/anxiety & intrusive thoughts of an impending doom on her family   24-hour Chart Review: Patient's case discussed in interdisciplinary team meeting.  Vital signs reviewed without critical values.  No as needed medication required overnight.  She slept a documented in 6.75 hours.  She is adherent to taking psychotropic medication regimen.   Today's Assessment Notes: On assessment today, Brandy Hogan was observed sitting on her bed and reported her mood as okay.  She denies current anxiety, rating it 0/10, and denies depression, also rating it 0/10.  She reports sleeping well last night and states her appetite is fine.  She denies suicidal ideation or homicidal ideation intent or plan.  She endorses ongoing auditory hallucinations described as clicking sounds but denies visual hallucinations.  She reports her energy level is okay and identifies her daily goal as to think positive.  She states she attended a goal-setting group today.  She denies any medication side effects or EPS symptoms.  She inquired about a timeframe for discharge.  [name] was alert and oriented, casually dressed, and fairly groomed.  She demonstrated poor eye contact, appeared internally preoccupied, and exhibited thought blocking during the interview.  Affect appeared restricted.  The case was reviewed with the attending psychiatrist.  We will decrease Seroquel  from 150 mg to 100 mg nightly, and initiate risperidone  1 mg nightly to more effectively target psychosis/hallucinations.   Principal Problem: Major depressive disorder, recurrent episode,  severe (HCC) Diagnosis: Principal Problem:   Major depressive disorder, recurrent episode, severe (HCC) Active Problems:   Obsessive-compulsive disorder, unspecified type   Generalized anxiety disorder  Total Time spent with patient: 45 minutes  Past Psychiatric History: see H&P  Past Medical History:  Past Medical History:  Diagnosis Date   Asthma    History of admission to inpatient psychiatry department 12/12/2022   [facility] 10/2022   Psychosis [facility])    History reviewed. No pertinent surgical history. Family History:  Family History  Problem Relation Age of Onset   Schizophrenia Mother    Bipolar disorder Sister    Family Psychiatric  History: see H&P Social History:  Social History   Substance and Sexual Activity  Alcohol Use Never     Social History   Substance and Sexual Activity  Drug Use Never    Social History   Socioeconomic History   Marital status: Married    Spouse name: Not on file   Number of children: Not on file   Years of education: Not on file   Highest education level: Not on file  Occupational History   Not on file  Tobacco Use   Smoking status: Never   Smokeless tobacco: Never  Substance and Sexual Activity   Alcohol use: Never   Drug use: Never   Sexual activity: Yes  Other Topics Concern   Not on file  Social History Narrative   Not on file   Social Drivers of Health   Financial Resource Strain: Not on file  Food Insecurity: No Food Insecurity (07/22/2024)   Hunger Vital Sign    Worried About Running Out of Food in the  Last Year: Never true    Ran Out of Food in the Last Year: Never true  Transportation Needs: No Transportation Needs (07/22/2024)   PRAPARE - Administrator, Civil Service (Medical): No    Lack of Transportation (Non-Medical): No  Physical Activity: Not on file  Stress: Not on file  Social Connections: Not on file   Additional Social History:                  Sleep: Fair Estimated  Sleeping Duration (Last 24 Hours): 6.25-7.25 hours  Appetite:  Fair  Current Medications: Current Facility-Administered Medications  Medication Dose Route Frequency Provider Last Rate Last Admin   acetaminophen  (TYLENOL ) tablet 650 mg  650 mg Oral Q6H PRN Nwoko, Agnes I, NP       albuterol  (VENTOLIN  HFA) 108 (90 Base) MCG/ACT inhaler 1-2 puff  1-2 puff Inhalation Q4H PRN [place] Mallon H, NP       alum & mag hydroxide-simeth (MAALOX/MYLANTA) 200-200-20 MG/5ML suspension 30 mL  30 mL Oral Q4H PRN Collene Gouge I, NP       hydrOXYzine  (ATARAX ) tablet 25 mg  25 mg Oral TID PRN Collene Gouge I, NP   25 mg at 07/23/24 2238   magnesium  hydroxide (MILK OF MAGNESIA) suspension 30 mL  30 mL Oral Daily PRN Nwoko, Gouge I, NP       OLANZapine  (ZYPREXA ) injection 5 mg  5 mg Intramuscular TID PRN Onuoha, Chinwendu V, NP       OLANZapine  zydis (ZYPREXA ) disintegrating tablet 5 mg  5 mg Oral TID PRN Onuoha, Chinwendu V, NP       QUEtiapine  (SEROQUEL ) tablet 150 mg  150 mg Oral QHS Paliy, Alisa, MD   150 mg at 07/24/24 2112   sertraline  (ZOLOFT ) tablet 50 mg  50 mg Oral Daily Nwoko, Agnes I, NP   50 mg at 07/25/24 9171   traZODone  (DESYREL ) tablet 50 mg  50 mg Oral QHS PRN Collene Gouge I, NP   50 mg at 07/23/24 2238    Lab Results:  No results found for this or any previous visit (from the past 48 hours).   Blood Alcohol level:  Lab Results  Component Value Date   [facility] <15 07/21/2024   ETH <10 12/15/2023    Metabolic Disorder Labs: Lab Results  Component Value Date   HGBA1C 5.6 07/21/2024   MPG 114 07/21/2024   MPG 123 12/15/2023   Lab Results  Component Value Date   PROLACTIN 12.7 11/07/2022   Lab Results  Component Value Date   CHOL 254 (H) 07/21/2024   TRIG 54 07/21/2024   HDL 109 07/21/2024   CHOLHDL 2.3 07/21/2024   VLDL 11 07/21/2024   LDLCALC 134 (H) 07/21/2024   LDLCALC 172 (H) 12/15/2023    Physical Findings: AIMS:  , 0  ,  ,  ,  ,  ,   CIWA:   N/A COWS:    N/A  Musculoskeletal: Strength & Muscle Tone: within normal limits Gait & Station: normal Patient leans: N/A  Psychiatric Specialty Exam:  Appearance:  AAF, appearing stated age,  wearing casual clothing, with fair grooming and appropriate hygiene. Normal level of alertness and sad facial expression.  Attitude/Behavior: Pleasant, Calm, cooperative, engaging with poor eye contact. Frequent thought blocking.  Motor: WNL; dyskinesias not evident.  Speech: soft, low volume, spontaneous, clear, coherent, normal comprehension.  Mood: dysthymic, It's okay.  Affect: Flat  Thought process: Patient appears coherent, mostly-organized, linear with questions, although  with delayed response ( due to thought blocking? or hallucinations?)   Thought content: patient denies suicidal thoughts, denies homicidal thoughts; expresses paranoid delusions.  Thought perception: patient reports auditory hallucinations of clicking sound in her head and appears internally stimulated during the interview.  Cognition: patient is alert and oriented in self, place.  Insight: limited  Judgement: questionable  Assets  Assets: Manufacturing systems engineer; Desire for Improvement; Housing; Social Support   Sleep  Sleep: No data recorded    Physical Exam: Physical Exam Vitals and nursing note reviewed.    Review of Systems  Constitutional: Negative.   Respiratory: Negative.    Cardiovascular: Negative.   Gastrointestinal: Negative.   Skin: Negative.   Neurological: Negative.   Psychiatric/Behavioral:  Positive for depression and hallucinations. Negative for substance abuse and suicidal ideas. The patient is nervous/anxious. The patient does not have insomnia.    Blood pressure 105/70, pulse 71, temperature 97.7 F (36.5 C), temperature source Oral, resp. rate 14, height 4' 11.75 (1.518 m), weight 49 kg, last menstrual period 09/08/2012, SpO2 99%. Body mass index is 21.27 kg/m.   Treatment Plan  Summary: Daily contact with patient to assess and evaluate symptoms and progress in treatment and Medication management  8/11: Patient exhibits persistent psychotic symptoms despite partial improvement in mood and anxiety. Ongoing auditory hallucinations, thought blocking, and internal preoccupation indicate active psychiatric symptoms and impaired reality testing, representing continued risk for decompensation if discharged prematurely.  Insight remains limited.   Diagnoses/ Active problems: -MDD with psychotic features -R/o psychotic disorder   PLAN:  Safety and Monitoring: continue inpatient psych admission; 15-minute checks; daily contact with patient to assess and evaluate symptoms and progress in treatment; psychoeducation.Vital signs: q12 hours. Precautions: suicide, elopement, and assault. Placed on room lock out for meals, snacks and groups.  Psychiatric Problems: - Decrease Seroquel  150 mg to 100 mg at bedtime for psychosis, sleep, mood.  - Initiate Risperdal  1 mg daily at bedtime, hallucinations  PRN medications: acetaminophen , albuterol , alum & mag hydroxide-simeth, hydrOXYzine , magnesium  hydroxide, OLANZapine , OLANZapine  zydis, traZODone     Pertinent Labs: no new labs ordered today  Consults: No new consults placed since yesterday    Discharge Planning: -Social work and case management to assist with discharge planning and identification of hospital follow-up needs prior to discharge -Estimated LOS: 7 days -Discharge Concerns: Need to establish a safety plan; Medication compliance and effectiveness -Discharge Goals: Return home with outpatient referrals for mental health follow-up including medication management/psychotherapy  I personally spent a total of 45 minutes in the care of the patient today including preparing to see the patient, getting/reviewing separately obtained history, counseling and educating, placing orders, documenting clinical information in the EHR,  and coordinating care.   Blair Chiquita Hint, NP 07/25/2024, 10:00 AM Patient ID: Brandy Hogan, female   DOB: 1966/08/20, 57 y.o.   MRN: [id number]

## 2024-07-25 NOTE — [facility] Group Notes (Signed)
 Psychoeducational Group Note  Date:  07/25/2024 Time:  2000  Group Topic/Focus:  Alcoholics Anonymous Meeting  Participation Level: Did Not Attend  Participation Quality:  Not Applicable  Affect:  Not Applicable  Cognitive:  Not Applicable  Insight:  Not Applicable  Engagement in Group: Not Applicable  Additional Comments:  Did not attend.   Brandy Hogan 07/25/2024, 9:18 PM

## 2024-07-25 NOTE — Plan of Care (Signed)
  Problem: Education: Goal: Knowledge of [facility] Education information/materials will improve Outcome: Progressing   Problem: Coping: Goal: Ability to verbalize frustrations and anger appropriately will improve Outcome: Progressing   Problem: Safety: Goal: Periods of time without injury will increase Outcome: Progressing   Problem: Activity: Goal: Interest or engagement in activities will improve Outcome: Not Progressing

## 2024-07-25 NOTE — [facility] Group Notes (Signed)
 Spiritual care group on grief and loss facilitated by Chaplain Rockie Sofia, [facility]  Group Goal: Support / Education around grief and loss  Members engage in facilitated group support and psycho-social education.  Group Description:  Following introductions and group rules, group members engaged in facilitated group dialogue and support around topic of loss, with particular support around experiences of loss in their lives. Group Identified types of loss (relationships / self / things) and identified patterns, circumstances, and changes that precipitate losses. Reflected on thoughts / feelings around loss, normalized grief responses, and recognized variety in grief experience. Group encouraged individual reflection on safe space and on the coping skills that they are already utilizing.  Group drew on Adlerian / Rogerian and narrative framework  Patient Progress: Brandy Hogan came in at the very end of group, but did not participate in conversation.

## 2024-07-25 NOTE — Progress Notes (Signed)
   07/25/24 0000  Psych Admission Type (Psych Patients Only)  Admission Status Voluntary  Psychosocial Assessment  Patient Complaints Isolation;Depression  Eye Contact Fair  Facial Expression Flat  Affect Anxious  Speech Logical/coherent  Interaction Assertive  Motor Activity Fidgety  Appearance/Hygiene Unremarkable  Behavior Characteristics Cooperative;Appropriate to situation  Mood Depressed  Thought Process  Coherency Blocking  Content Preoccupation  Delusions None reported or observed  Perception WDL  Hallucination None reported or observed  Judgment Poor  Confusion Mild  Danger to Self  Current suicidal ideation? Denies  Description of Suicide Plan none  Agreement Not to Harm Self Yes  Description of Agreement verbal  Danger to Others  Danger to Others None reported or observed

## 2024-07-26 DIAGNOSIS — F333 Major depressive disorder, recurrent, severe with psychotic symptoms: Secondary | ICD-10-CM | POA: Diagnosis not present

## 2024-07-26 NOTE — [facility] Group Notes (Signed)
 Adult Psychoeducational Group Note  Date:  07/26/2024 Time:  9:22 AM  Group Topic/Focus:  Goals Group:   The focus of this group is to help patients establish daily goals to achieve during treatment and discuss how the patient can incorporate goal setting into their daily lives to aide in recovery. Orientation:   The focus of this group is to educate the patient on the purpose and policies of crisis stabilization and provide a format to answer questions about their admission.  The group details unit policies and expectations of patients while admitted.  Participation Level:  Did Not Attend  Participation Quality:    Affect:    Cognitive:    Insight:   Engagement in Group:    Modes of Intervention:    Additional Comments:    Jamelle Cassondra KIDD 07/26/2024, 9:22 AM

## 2024-07-26 NOTE — [facility] Group Notes (Signed)
 [facility] Group Notes:  (Nursing/MHT/Case Management/Adjunct)  Date:  07/26/2024  Time:  8:51 PM  Type of Therapy:  Psychoeducational Skills  Participation Level:  Active  Participation Quality:  Appropriate  Affect:  Depressed  Cognitive:  Appropriate  Insight:  Appropriate  Engagement in Group:  Developing/Improving  Modes of Intervention:  Education  Summary of Progress/Problems: The patient rated her day as a 7 out of a possible 10. She states that she is taking new medication and is therefore feeling drowsy. She also verbalized that she attended groups and had a good visit as well.   Selyna Klahn S 07/26/2024, 8:51 PM

## 2024-07-26 NOTE — Progress Notes (Addendum)
 Shift Note  (Sleep Hours) - 8.25  (Any PRNs that were needed, meds refused, or side effects to meds)- PRN  PO Hydroxyzine  and Trazodone  given  (Any disturbances and when (visitation, over night)-None  (Concerns raised by the patient)- Pt was apprehensive on taking her bedtime risperidone  medication. She stated she does not remember the provider telling her about new medications.   (SI/HI/AVH)- Denies    07/25/24 2105  Psych Admission Type (Psych Patients Only)  Admission Status Voluntary  Psychosocial Assessment  Patient Complaints Anxiety  Eye Contact Fair  Facial Expression Anxious  Affect Anxious  Speech Logical/coherent  Interaction Assertive  Motor Activity Slow  Appearance/Hygiene Unremarkable  Behavior Characteristics Anxious  Mood Anxious  Thought Process  Coherency Blocking  Content Ambivalence  Delusions None reported or observed  Perception WDL  Hallucination None reported or observed  Judgment Poor  Confusion Mild  Danger to Self  Current suicidal ideation? Denies  Danger to Others  Danger to Others None reported or observed

## 2024-07-26 NOTE — Progress Notes (Signed)
   07/26/24 0800  Psych Admission Type (Psych Patients Only)  Admission Status Voluntary  Psychosocial Assessment  Patient Complaints None;Nervousness  Eye Contact Brief;Avoids  Facial Expression Anxious  Affect Anxious  Speech Logical/coherent  Interaction Assertive  Motor Activity Slow  Appearance/Hygiene Unremarkable  Behavior Characteristics Cooperative;Anxious  Mood Anxious  Thought Process  Coherency Blocking  Content Preoccupation  Delusions None reported or observed  Perception WDL  Hallucination None reported or observed  Judgment Poor  Confusion Mild  Danger to Self  Current suicidal ideation? Denies  Agreement Not to Harm Self Yes  Description of Agreement verbal  Danger to Others  Danger to Others None reported or observed

## 2024-07-26 NOTE — Group Note (Signed)
 LCSW Group Therapy Note   Group Date: 07/26/2024 Start Time: 1100 End Time: 1200   Participation:  patient was present  Type of Therapy:  Group Therapy   Topic:  Lifestyle:  from "One Day" to "Today is Day One"  Objective:  To promote mental and physical well-being through lifestyle changes in routine, nutrition, sleep, and movement.  Goals: Increase awareness of how lifestyle habits impact mental health. Encourage one small, achievable wellness goal. Support group sharing and accountability.  Summary:  Group members explored how daily habits influence mental health and discussed the importance of starting with small, manageable changes. Participants identified personal goals and shared reflections on improving structure, sleep, diet, and physical activity.  Therapeutic Modalities: CBT - Identifying and challenging all-or-nothing thinking; promoting realistic, helpful thoughts about change. Psychoeducation - Teaching about the impact of sleep, nutrition, movement, and routine on mental health. Motivational Interviewing - Eliciting personal motivation and exploring readiness for change. Goal-Setting - Supporting SMART goals to build self-efficacy and encourage follow-through.   Florice Hindle O Tynika Luddy, LCSWA 07/26/2024  6:58 PM

## 2024-07-26 NOTE — Progress Notes (Addendum)
 [facility] MD Progress Note  07/26/2024 9:23 AM Brandy Hogan  MRN:  [id number]  Reason for Admission: 58 year old AA female with prior history of mental illnesses. Patient is known in this [facility] from her previous admission in the early part of 2024. At the time, she presented with worsening psychosis. She is being re-admitted to the [facility] from the [facility] with complain of worsening insomnia/anxiety & intrusive thoughts of an impending doom on her family   24-hour Chart Review: Patient's case discussed in interdisciplinary team meeting.  Vital signs reviewed without critical values.  As needed hydroxyzine  and trazodone  required and received overnight.  Subsequently, she slept a documented in 8.25 hours.  She is adherent to taking psychotropic medication regimen.   Yesterday the psychiatry team made the following recommendations: Decrease Seroquel  150 mg to 100 mg at bedtime for psychosis, sleep, mood  Initiate Risperdal  1 mg daily at bedtime, hallucinations   Today's Assessment Notes: On assessment today, Brandy Hogan was observed sitting on the side of the bed, eating a snack, and dressed in casual clothing.  She reports mild depression and states she is ready to go home.  She denies anxiety symptoms.  Reports sleeping well last night but feels tired today, attributing this to extra medication received last night.  States her appetite is good.  Denies auditory hallucinations currently but notes that she heard clicking sounds this morning which have since improved.  Denies visual hallucinations or paranoia.  Reports her low energy today due to medication effects but feels her concentration and thought processes are improved.  Denies suicidal homicidal ideation, intent, or plan.  Her stated daily goal is to try to think positively.  Brandy Hogan also reports she is established with a counselor whom she sees in person every other week and finds helpful.  States she believes she has an appointment with her counselor, Ms.  Hogan, today at [employer] [employer] in [place].  Brandy Hogan appeared slightly sedated, likely secondary to multiple medications administered last night.  She maintained only brief eye contact and was somewhat avoidant.  She did not appear to respond to internal stimuli. Overall, she tolerated recent medication adjustments well. The case was reviewed with the attending psychiatrist.  We will discontinue Seroquel  and maintain other medications at current doses.  Social work to facilitate rescheduling her counseling appointment.    Principal Problem: Major depressive disorder, recurrent episode, severe (HCC) Diagnosis: Principal Problem:   Major depressive disorder, recurrent episode, severe (HCC) Active Problems:   Obsessive-compulsive disorder, unspecified type   Generalized anxiety disorder  Total Time spent with patient: 30 minutes  Past Psychiatric History: see H&P  Past Medical History:  Past Medical History:  Diagnosis Date   Asthma    History of admission to inpatient psychiatry department 12/12/2022   [facility] 10/2022   Psychosis [facility])    History reviewed. No pertinent surgical history. Family History:  Family History  Problem Relation Age of Onset   Schizophrenia Mother    Bipolar disorder Sister    Family Psychiatric  History: see H&P Social History:  Social History   Substance and Sexual Activity  Alcohol Use Never     Social History   Substance and Sexual Activity  Drug Use Never    Social History   Socioeconomic History   Marital status: Married    Spouse name: Not on file   Number of children: Not on file   Years of education: Not on file   Highest education level: Not on  file  Occupational History   Not on file  Tobacco Use   Smoking status: Never   Smokeless tobacco: Never  Substance and Sexual Activity   Alcohol use: Never   Drug use: Never   Sexual activity: Yes  Other Topics Concern   Not on file  Social History Narrative   Not on file    Social Drivers of Health   Financial Resource Strain: Not on file  Food Insecurity: No Food Insecurity (07/22/2024)   Hunger Vital Sign    Worried About Running Out of Food in the Last Year: Never true    Ran Out of Food in the Last Year: Never true  Transportation Needs: No Transportation Needs (07/22/2024)   PRAPARE - Administrator, Civil Service (Medical): No    Lack of Transportation (Non-Medical): No  Physical Activity: Not on file  Stress: Not on file  Social Connections: Not on file   Additional Social History:                  Sleep: Fair Estimated Sleeping Duration (Last 24 Hours): 6.50-7.50 hours  Appetite:  Fair  Current Medications: Current Facility-Administered Medications  Medication Dose Route Frequency Provider Last Rate Last Admin   acetaminophen  (TYLENOL ) tablet 650 mg  650 mg Oral Q6H PRN Nwoko, Agnes I, NP       albuterol  (VENTOLIN  HFA) 108 (90 Base) MCG/ACT inhaler 1-2 puff  1-2 puff Inhalation Q4H PRN Jenavieve Freda H, NP       alum & mag hydroxide-simeth (MAALOX/MYLANTA) 200-200-20 MG/5ML suspension 30 mL  30 mL Oral Q4H PRN Collene Gouge I, NP       hydrOXYzine  (ATARAX ) tablet 25 mg  25 mg Oral TID PRN Collene Gouge I, NP   25 mg at 07/25/24 2104   magnesium  hydroxide (MILK OF MAGNESIA) suspension 30 mL  30 mL Oral Daily PRN Nwoko, Gouge I, NP       OLANZapine  (ZYPREXA ) injection 5 mg  5 mg Intramuscular TID PRN Onuoha, Chinwendu V, NP       OLANZapine  zydis (ZYPREXA ) disintegrating tablet 5 mg  5 mg Oral TID PRN Onuoha, Chinwendu V, NP       QUEtiapine  (SEROQUEL ) tablet 100 mg  100 mg Oral QHS Bridget Westbrooks H, NP   100 mg at 07/25/24 2104   risperiDONE  (RISPERDAL ) tablet 1 mg  1 mg Oral QHS Lorrie Gargan H, NP   1 mg at 07/25/24 2104   sertraline  (ZOLOFT ) tablet 50 mg  50 mg Oral Daily Nwoko, Agnes I, NP   50 mg at 07/26/24 0746   traZODone  (DESYREL ) tablet 50 mg  50 mg Oral QHS PRN Collene Gouge I, NP   50 mg at 07/25/24 2104     Lab Results:  No results found for this or any previous visit (from the past 48 hours).   Blood Alcohol level:  Lab Results  Component Value Date   [facility] <15 07/21/2024   ETH <10 12/15/2023    Metabolic Disorder Labs: Lab Results  Component Value Date   HGBA1C 5.6 07/21/2024   MPG 114 07/21/2024   MPG 123 12/15/2023   Lab Results  Component Value Date   PROLACTIN 12.7 11/07/2022   Lab Results  Component Value Date   CHOL 254 (H) 07/21/2024   TRIG 54 07/21/2024   HDL 109 07/21/2024   CHOLHDL 2.3 07/21/2024   VLDL 11 07/21/2024   LDLCALC 134 (H) 07/21/2024   LDLCALC 172 (H) 12/15/2023  Physical Findings: AIMS:  , 0  ,  ,  ,  ,  ,   CIWA:   N/A COWS:   N/A  Musculoskeletal: Strength & Muscle Tone: within normal limits Gait & Station: normal Patient leans: N/A  Psychiatric Specialty Exam:  Appearance:  AAF, appearing stated age,  wearing casual clothing, with fair grooming and appropriate hygiene. Normal level of alertness and sad facial expression.  Attitude/Behavior: Pleasant, Calm, cooperative, engaging with poor eye contact. Frequent thought blocking.  Motor: WNL; dyskinesias not evident.  Speech: soft, low volume, spontaneous, clear, coherent, normal comprehension.  Mood: dysthymic, It's okay.  Affect: Flat  Thought process: Patient appears coherent, mostly-organized, linear with questions, although with delayed response ( due to thought blocking? or hallucinations?)   Thought content: patient denies suicidal thoughts, denies homicidal thoughts; expresses paranoid delusions.  Thought perception: patient reports auditory hallucinations of clicking sound in her head and appears internally stimulated during the interview.  Cognition: patient is alert and oriented in self, place.  Insight: limited  Judgement: questionable  Assets  Assets: Manufacturing systems engineer; Desire for Improvement; Housing; Social Support   Sleep  Sleep: No data  recorded    Physical Exam: Physical Exam Vitals and nursing note reviewed.    Review of Systems  Constitutional: Negative.   Respiratory: Negative.    Cardiovascular: Negative.   Gastrointestinal: Negative.   Skin: Negative.   Neurological: Negative.   Psychiatric/Behavioral:  Positive for depression and hallucinations. Negative for substance abuse and suicidal ideas. The patient is nervous/anxious. The patient does not have insomnia.    Blood pressure 110/78, pulse 71, temperature 97.9 F (36.6 C), temperature source Oral, resp. rate 16, height 4' 11.75 (1.518 m), weight 49 kg, last menstrual period 09/08/2012, SpO2 100%. Body mass index is 21.27 kg/m.   Treatment Plan Summary: Daily contact with patient to assess and evaluate symptoms and progress in treatment and Medication management   Diagnoses/ Active problems: -MDD with psychotic features -R/o psychotic disorder   PLAN:  Safety and Monitoring: continue inpatient psych admission; 15-minute checks; daily contact with patient to assess and evaluate symptoms and progress in treatment; psychoeducation.Vital signs: q12 hours. Precautions: suicide, elopement, and assault. Placed on room lock out for meals, snacks and groups.  Psychiatric Problems: - Discontinue Seroquel  150 mg to 100 mg at bedtime for psychosis, sleep, mood.  - Continue Risperdal  1 mg daily at bedtime, hallucinations  PRN medications: acetaminophen , albuterol , alum & mag hydroxide-simeth, hydrOXYzine , magnesium  hydroxide, OLANZapine , OLANZapine  zydis, traZODone     Pertinent Labs: no new labs ordered today  Consults: No new consults placed since yesterday    Discharge Planning: -Social work and case management to assist with discharge planning and identification of hospital follow-up needs prior to discharge -Estimated LOS: 7 days -Discharge Concerns: Need to establish a safety plan; Medication compliance and effectiveness -Discharge Goals: Return  home with outpatient referrals for mental health follow-up including medication management/psychotherapy   Blair Chiquita Hint, NP 07/26/2024, 9:23 AM Patient ID: Brandy Hogan, female   DOB: Mar 09, 1966, 58 y.o.   MRN: [id number] Patient ID: Brandy Hogan, female   DOB: 1966/07/06, 58 y.o.   MRN: [id number]

## 2024-07-26 NOTE — Plan of Care (Signed)
  Problem: Education: Goal: Mental status will improve Outcome: Progressing Goal: Verbalization of understanding the information provided will improve Outcome: Progressing   Problem: Health Behavior/Discharge Planning: Goal: Identification of resources available to assist in meeting health care needs will improve Outcome: Progressing   Problem: Safety: Goal: Periods of time without injury will increase Outcome: Progressing

## 2024-07-27 ENCOUNTER — Encounter (HOSPITAL_COMMUNITY): Payer: Self-pay

## 2024-07-27 DIAGNOSIS — F332 Major depressive disorder, recurrent severe without psychotic features: Secondary | ICD-10-CM | POA: Diagnosis not present

## 2024-07-27 DIAGNOSIS — F429 Obsessive-compulsive disorder, unspecified: Secondary | ICD-10-CM | POA: Diagnosis not present

## 2024-07-27 DIAGNOSIS — F411 Generalized anxiety disorder: Secondary | ICD-10-CM | POA: Diagnosis not present

## 2024-07-27 NOTE — Group Note (Deleted)
 Date:  07/27/2024 Time:  2:21 PM  Group Topic/Focus:  Wellness Toolbox:   The focus of this group is to discuss various aspects of wellness, balancing those aspects and exploring ways to increase the ability to experience wellness.  Patients will create a wellness toolbox for use upon discharge.     Participation Level:  {BHH PARTICIPATION OZCZO:77735}  Participation Quality:  {BHH PARTICIPATION QUALITY:22265}  Affect:  {BHH AFFECT:22266}  Cognitive:  {BHH COGNITIVE:22267}  Insight: {BHH Insight2:20797}  Engagement in Group:  {BHH ENGAGEMENT IN HMNLE:77731}  Modes of Intervention:  {BHH MODES OF INTERVENTION:22269}  Additional Comments:  ***  Myra Curtistine BROCKS 07/27/2024, 2:21 PM

## 2024-07-27 NOTE — Group Note (Signed)
 Date:  07/27/2024 Time:  6:12 PM  Group Topic/Focus:  Overcoming Stress:   The focus of this group is to define stress and help patients assess their triggers.    Participation Level:  Active  Participation Quality:  Appropriate  Affect:  Appropriate  Cognitive:  Alert  Insight: Good  Engagement in Group:  Engaged  Modes of Intervention:  Activity and Discussion   Brandy Hogan 07/27/2024, 6:12 PM

## 2024-07-27 NOTE — Progress Notes (Signed)
 [facility] MD Progress Note  07/27/2024 1:35 PM Brandy Hogan  MRN:  [id number]  Reason for Admission: 58 year old AA female with prior history of mental illnesses. Patient is known in this [facility] from her previous admission in the early part of 2024. At the time, she presented with worsening psychosis. She is being re-admitted to the [facility] from the [facility] with complain of worsening insomnia/anxiety & intrusive thoughts of an impending doom on her family   Daily notes: Brandy Hogan is seen in her room this morning. Chart reviewed. The chart findings discussed with the treatment team during the team meeting this afternoon. She presents alert, oriented x 3 & aware of situation. She is visible on the unit, attending group sessions. She presents with an improving affect, good eye contact & verbally responsive. She reports, I have been here since the 7th of August because I was having problem sleeping at night. But my sleep has improved since being here. I feel like I'm ready to go home. My mood is good & I'm doing well on my medicines. I'm ready to go home in [facility]. Although presents with flat/improving affect, Brandy Hogan presents a lot better today than when first admitted. On the day of her admission, she presented very sluggish, fatigued & had difficulties processing information. Today, she presents very clear in her speech & in making a concrete request. She currently denies any new issues or concerns. She denies any side effects to her medications. Staff reports that she is attending/participating in the group sessions. As agreed by her treatment team this afternoon, Brandy Hogan is likely to be discharged tomorrow. There are no changes made on the current plan of care. Continue as already in progress. Her vital signs remains stable.  Principal Problem: Major depressive disorder, recurrent episode, severe (HCC)  Diagnosis: Principal Problem:   Major depressive disorder, recurrent episode, severe (HCC) Active Problems:    Obsessive-compulsive disorder, unspecified type   Generalized anxiety disorder  Total Time spent with patient: 30 minutes  Past Psychiatric History: See H&P  Past Medical History:  Past Medical History:  Diagnosis Date   Asthma    History of admission to inpatient psychiatry department 12/12/2022   [facility] 10/20[facility]   Psychosis [facility])    History reviewed. No pertinent surgical history. Family History:  Family History  Problem Relation Age of Onset   Schizophrenia Mother    Bipolar disorder Sister    Family Psychiatric  History: See H&P Social History:  Social History   Substance and Sexual Activity  Alcohol Use Never     Social History   Substance and Sexual Activity  Drug Use Never    Social History   Socioeconomic History   Marital status: Married    Spouse name: Not on file   Number of children: Not on file   Years of education: Not on file   Highest education level: Not on file  Occupational History   Not on file  Tobacco Use   Smoking status: Never   Smokeless tobacco: Never  Substance and Sexual Activity   Alcohol use: Never   Drug use: Never   Sexual activity: Yes  Other Topics Concern   Not on file  Social History Narrative   Not on file   Social Drivers of Health   Financial Resource Strain: Not on file  Food Insecurity: No Food Insecurity (07/22/2024)   Hunger Vital Sign    Worried About Running Out of Food in the Last Year: Never true  Ran Out of Food in the Last Year: Never true  Transportation Needs: No Transportation Needs (07/22/2024)   PRAPARE - Administrator, Civil Service (Medical): No    Lack of Transportation (Non-Medical): No  Physical Activity: Not on file  Stress: Not on file  Social Connections: Not on file   Additional Social History:   Sleep: Fair Estimated Sleeping Duration (Last 24 Hours): 6.25-6.50 hours  Appetite:  Fair  Current Medications: Current Facility-Administered Medications  Medication  Dose Route Frequency Provider Last Rate Last Admin   acetaminophen  (TYLENOL ) tablet 650 mg  650 mg Oral Q6H PRN Kalev Temme I, NP       albuterol  (VENTOLIN  HFA) 108 (90 Base) MCG/ACT inhaler 1-2 puff  1-2 puff Inhalation Q4H PRN Bennett, Christal H, NP       alum & mag hydroxide-simeth (MAALOX/MYLANTA) 200-200-20 MG/5ML suspension 30 mL  30 mL Oral Q4H PRN Collene Gouge I, NP       hydrOXYzine  (ATARAX ) tablet 25 mg  25 mg Oral TID PRN Collene Gouge I, NP   25 mg at 07/25/24 2104   magnesium  hydroxide (MILK OF MAGNESIA) suspension 30 mL  30 mL Oral Daily PRN Kathee Tumlin, Gouge I, NP       OLANZapine  (ZYPREXA ) injection 5 mg  5 mg Intramuscular TID PRN Onuoha, Chinwendu V, NP       OLANZapine  zydis (ZYPREXA ) disintegrating tablet 5 mg  5 mg Oral TID PRN Onuoha, Chinwendu V, NP       risperiDONE  (RISPERDAL ) tablet 1 mg  1 mg Oral QHS Bennett, Christal H, NP   1 mg at 07/26/24 2126   sertraline  (ZOLOFT ) tablet 50 mg  50 mg Oral Daily Emery Binz I, NP   50 mg at 07/27/24 9176   traZODone  (DESYREL ) tablet 50 mg  50 mg Oral QHS PRN Collene Gouge I, NP   50 mg at 07/26/24 2126    Lab Results:  No results found for this or any previous visit (from the past 48 hours).   Blood Alcohol level:  Lab Results  Component Value Date   [facility] <15 07/21/2024   ETH <10 12/15/2023    Metabolic Disorder Labs: Lab Results  Component Value Date   HGBA1C 5.6 07/21/2024   MPG 114 07/21/2024   MPG 123 12/15/2023   Lab Results  Component Value Date   PROLACTIN 12.7 11/07/2022   Lab Results  Component Value Date   CHOL 254 (H) 07/21/2024   TRIG 54 07/21/2024   HDL 109 07/21/2024   CHOLHDL 2.3 07/21/2024   VLDL 11 07/21/2024   LDLCALC 134 (H) 07/21/2024   LDLCALC 172 (H) 12/15/2023    Physical Findings: AIMS:  , 0  ,  ,  ,  ,  ,   CIWA:   N/A COWS:   N/A  Musculoskeletal: Strength & Muscle Tone: within normal limits Gait & Station: normal Patient leans: N/A  Psychiatric Specialty Exam:  Appearance:   AAF, appearing stated age,  wearing casual clothing, with fair grooming and appropriate hygiene. Normal level of alertness and sad facial expression.  Attitude/Behavior: Pleasant, Calm, cooperative, engaging with poor eye contact. Frequent thought blocking.  Motor: WNL; dyskinesias not evident.  Speech: soft, low volume, spontaneous, clear, coherent, normal comprehension.  Mood: dysthymic, It's okay.  Affect: Flat  Thought process: Patient appears coherent, mostly-organized, linear with questions, although with delayed response ( due to thought blocking? or hallucinations?)   Thought content: patient denies suicidal thoughts, denies homicidal thoughts;  expresses paranoid delusions.  Thought perception: patient reports auditory hallucinations of clicking sound in her head and appears internally stimulated during the interview.  Cognition: patient is alert and oriented in self, place.  Insight: limited  Judgement: questionable  Assets  Assets: Manufacturing systems engineer; Desire for Improvement; Housing; Social Support   Sleep  Sleep: No data recorded    Physical Exam: Physical Exam Vitals and nursing note reviewed.  HENT:     Nose: Nose normal.     Mouth/Throat:     Pharynx: Oropharynx is clear.  Cardiovascular:     Rate and Rhythm: Normal rate.     Pulses: Normal pulses.  Pulmonary:     Effort: Pulmonary effort is normal.  Genitourinary:    Comments: Deferred. Musculoskeletal:        General: Normal range of motion.     Cervical back: Normal range of motion.  Skin:    General: Skin is dry.  Neurological:     General: No focal deficit present.     Mental Status: She is alert and oriented to person, place, and time.    Review of Systems  Constitutional: Negative.   Respiratory: Negative.    Cardiovascular: Negative.   Gastrointestinal: Negative.   Skin: Negative.   Neurological: Negative.   Psychiatric/Behavioral:  Positive for depression (Improving per  patient's report.). Negative for hallucinations, memory loss, substance abuse and suicidal ideas. The patient is nervous/anxious. The patient does not have insomnia.    Blood pressure 94/81, pulse 75, temperature 98.2 F (36.8 C), temperature source Oral, resp. rate 16, height 4' 11.75 (1.518 m), weight 49 kg, last menstrual period 09/08/2012, SpO2 99%. Body mass index is 21.27 kg/m.  Treatment Plan Summary: Daily contact with patient to assess and evaluate symptoms and progress in treatment and Medication management  Diagnoses/ Active problems: -MDD with psychotic features -R/o psychotic disorder  PLAN:  Safety and Monitoring: continue inpatient psych admission; 15-minute checks; daily contact with patient to assess and evaluate symptoms and progress in treatment; psychoeducation.Vital signs: q12 hours. Precautions: suicide, elopement, and assault. Placed on room lock out for meals, snacks and groups.  Psychiatric Problems: - Discontinue Seroquel  150 mg to 100 mg at bedtime for psychosis, sleep, mood.  - Continue Risperdal  1 mg daily at bedtime, hallucinations  PRN medications: acetaminophen , albuterol , alum & mag hydroxide-simeth, hydrOXYzine , magnesium  hydroxide, OLANZapine , OLANZapine  zydis, traZODone     Pertinent Labs: no new labs ordered today  Consults: No new consults placed since yesterday    Discharge Planning: -Social work and case management to assist with discharge planning and identification of hospital follow-up needs prior to discharge -Estimated LOS: 7 days -Discharge Concerns: Need to establish a safety plan; Medication compliance and effectiveness -Discharge Goals: Return home with outpatient referrals for mental health follow-up including medication management/psychotherapy  Mac Bolster, NP, pmhnp, fnp-bc. 07/27/2024, 1:35 PM Patient ID: Brandy Hogan, female   DOB: 01-26-1966, 58 y.o.   MRN: [id number] Patient ID: Brandy Hogan, female   DOB:  December 02, 1966, 58 y.o.   MRN: [id number] Patient ID: Brandy Hogan, female   DOB: 1966/02/04, 58 y.o.   MRN: [id number]

## 2024-07-27 NOTE — Plan of Care (Signed)
  Problem: Education: Goal: Emotional status will improve Outcome: Progressing Goal: Verbalization of understanding the information provided will improve Outcome: Progressing   Problem: Activity: Goal: Sleeping patterns will improve Outcome: Progressing

## 2024-07-27 NOTE — Progress Notes (Signed)
   07/27/24 0800  Psych Admission Type (Psych Patients Only)  Admission Status Voluntary  Psychosocial Assessment  Patient Complaints Depression  Eye Contact Brief  Facial Expression Flat  Affect Apprehensive  Speech Logical/coherent  Interaction Cautious  Motor Activity Slow  Appearance/Hygiene Unremarkable  Behavior Characteristics Appropriate to situation;Cooperative  Mood Pleasant  Thought Process  Coherency WDL  Content WDL  Delusions None reported or observed  Perception WDL  Hallucination None reported or observed  Judgment Limited  Confusion WDL  Danger to Self  Current suicidal ideation? Denies  Agreement Not to Harm Self Yes  Description of Agreement verbal  Danger to Others  Danger to Others None reported or observed

## 2024-07-27 NOTE — BH IP Treatment Plan (Signed)
 Interdisciplinary Treatment and Diagnostic Plan Update  07/27/2024 Time of Session: UPDATE Brandy Hogan MRN: [id number]  Principal Diagnosis: Major depressive disorder, recurrent episode, severe (HCC)  Secondary Diagnoses: Principal Problem:   Major depressive disorder, recurrent episode, severe (HCC) Active Problems:   Obsessive-compulsive disorder, unspecified type   Generalized anxiety disorder   Current Medications:  Current Facility-Administered Medications  Medication Dose Route Frequency Provider Last Rate Last Admin   acetaminophen  (TYLENOL ) tablet 650 mg  650 mg Oral Q6H PRN Nwoko, Mac I, NP       albuterol  (VENTOLIN  HFA) 108 (90 Base) MCG/ACT inhaler 1-2 puff  1-2 puff Inhalation Q4H PRN Bennett, Christal H, NP       alum & mag hydroxide-simeth (MAALOX/MYLANTA) 200-200-20 MG/5ML suspension 30 mL  30 mL Oral Q4H PRN Nwoko, Agnes I, NP       hydrOXYzine  (ATARAX ) tablet 25 mg  25 mg Oral TID PRN Collene Mac I, NP   25 mg at 07/25/24 2104   magnesium  hydroxide (MILK OF MAGNESIA) suspension 30 mL  30 mL Oral Daily PRN Nwoko, Mac I, NP       OLANZapine  (ZYPREXA ) injection 5 mg  5 mg Intramuscular TID PRN Onuoha, Chinwendu V, NP       OLANZapine  zydis (ZYPREXA ) disintegrating tablet 5 mg  5 mg Oral TID PRN Onuoha, Chinwendu V, NP       risperiDONE  (RISPERDAL ) tablet 1 mg  1 mg Oral QHS Bennett, Christal H, NP   1 mg at 07/26/24 2126   sertraline  (ZOLOFT ) tablet 50 mg  50 mg Oral Daily Nwoko, Mac I, NP   50 mg at 07/27/24 9176   traZODone  (DESYREL ) tablet 50 mg  50 mg Oral QHS PRN Collene Mac I, NP   50 mg at 07/26/24 2126   PTA Medications: Medications Prior to Admission  Medication Sig Dispense Refill Last Dose/Taking   albuterol  (VENTOLIN  HFA) 108 (90 Base) MCG/ACT inhaler Inhale into the lungs every 6 (six) hours as needed for wheezing or shortness of breath.   Past Month   cetirizine (ZYRTEC) 10 MG tablet Take 10 mg by mouth daily as needed for allergies.   Past  Month   diphenhydrAMINE  (BENADRYL ) 25 mg capsule Take 25 mg by mouth every 6 (six) hours as needed for allergies.   Past Month   hydrOXYzine  (VISTARIL ) 25 MG capsule Take 1 capsule (25 mg total) by mouth at bedtime. May take one extra as needed. 60 capsule 0 Past Week   Multiple Vitamins-Minerals (ADULT GUMMY PO) Take 2 tablets by mouth daily.   Past Week    Patient Stressors: Other: Stressors are uncertainty.    Patient Strengths: Ability for insight  Motivation for treatment/growth  Physical Health  Special hobby/interest   Treatment Modalities: Medication Management, Group therapy, Case management,  1 to 1 session with clinician, Psychoeducation, Recreational therapy.   Physician Treatment Plan for Primary Diagnosis: Major depressive disorder, recurrent episode, severe (HCC) Long Term Goal(s): Improvement in symptoms so as ready for discharge   Short Term Goals: Ability to identify and develop effective coping behaviors will improve Ability to maintain clinical measurements within normal limits will improve Compliance with prescribed medications will improve Ability to identify triggers associated with substance abuse/mental health issues will improve Ability to verbalize feelings will improve Ability to disclose and discuss suicidal ideas Ability to demonstrate self-control will improve  Medication Management: Evaluate patient's response, side effects, and tolerance of medication regimen.  Therapeutic Interventions: 1 to 1 sessions, Unit Group  sessions and Medication administration.  Evaluation of Outcomes: Progressing  Physician Treatment Plan for Secondary Diagnosis: Principal Problem:   Major depressive disorder, recurrent episode, severe (HCC) Active Problems:   Obsessive-compulsive disorder, unspecified type   Generalized anxiety disorder  Long Term Goal(s): Improvement in symptoms so as ready for discharge   Short Term Goals: Ability to identify and develop  effective coping behaviors will improve Ability to maintain clinical measurements within normal limits will improve Compliance with prescribed medications will improve Ability to identify triggers associated with substance abuse/mental health issues will improve Ability to verbalize feelings will improve Ability to disclose and discuss suicidal ideas Ability to demonstrate self-control will improve     Medication Management: Evaluate patient's response, side effects, and tolerance of medication regimen.  Therapeutic Interventions: 1 to 1 sessions, Unit Group sessions and Medication administration.  Evaluation of Outcomes: Progressing   RN Treatment Plan for Primary Diagnosis: Major depressive disorder, recurrent episode, severe (HCC) Long Term Goal(s): Knowledge of disease and therapeutic regimen to maintain health will improve  Short Term Goals: Ability to verbalize feelings will improve, Ability to disclose and discuss suicidal ideas, Ability to identify and develop effective coping behaviors will improve, and Compliance with prescribed medications will improve  Medication Management: RN will administer medications as ordered by provider, will assess and evaluate patient's response and provide education to patient for prescribed medication. RN will report any adverse and/or side effects to prescribing provider.  Therapeutic Interventions: 1 on 1 counseling sessions, Psychoeducation, Medication administration, Evaluate responses to treatment, Monitor vital signs and CBGs as ordered, Perform/monitor CIWA, COWS, AIMS and Fall Risk screenings as ordered, Perform wound care treatments as ordered.  Evaluation of Outcomes: Progressing   LCSW Treatment Plan for Primary Diagnosis: Major depressive disorder, recurrent episode, severe (HCC) Long Term Goal(s): Safe transition to appropriate next level of care at discharge, Engage patient in therapeutic group addressing interpersonal  concerns.  Short Term Goals: Engage patient in aftercare planning with referrals and resources, Increase social support, Increase ability to appropriately verbalize feelings, and Increase skills for wellness and recovery  Therapeutic Interventions: Assess for all discharge needs, 1 to 1 time with Social worker, Explore available resources and support systems, Assess for adequacy in community support network, Educate family and significant other(s) on suicide prevention, Complete Psychosocial Assessment, Interpersonal group therapy.  Evaluation of Outcomes: Progressing   Progress in Treatment: Attending groups: Yes. Participating in groups: Yes. Taking medication as prescribed: Yes. Toleration medication: Yes. Family/Significant other contact made: Yes, Alaja Goldinger (spouse) [phone] Patient understands diagnosis: Yes. Discussing patient identified problems/goals with staff: Yes. Medical problems stabilized or resolved: Yes. Denies suicidal/homicidal ideation: Yes. Issues/concerns per patient self-inventory: No.   New problem(s) identified:  No   New Short Term/Long Term Goal(s):     medication stabilization, elimination of SI thoughts, development of comprehensive mental wellness plan.    Patient Goals:  I have trouble sleeping.  I didn't sleep last night.  A few days ago, I only slept for a few hours.   Discharge Plan or Barriers:  Patient recently admitted. CSW will continue to follow and assess for appropriate referrals and possible discharge planning.    Reason for Continuation of Hospitalization: Anxiety Depression Medication stabilization Suicidal ideation   Estimated Length of Stay:  3-5 days  Last 3 [place] Suicide Severity Risk Score: Flowsheet Row [facility] (Current[facility]21/2024 in [facility] from [facility]15/20[facility] in [facility]  C-SSRS  RISK CATEGORY Low Risk Error: Q3, 4, or 5 should not be populated when Q2 is No No Risk    Last PHQ 2/9 Scores:    11/07/2023   12:12 PM  Depression screen PHQ 2/9  Decreased Interest 3  Down, Depressed, Hopeless 2  PHQ - 2 Score 5  Altered sleeping 2  Tired, decreased energy 2  Change in appetite 3  Feeling bad or failure about yourself  3  Trouble concentrating 3  Moving slowly or fidgety/restless 1  Suicidal thoughts 1  PHQ-9 Score 20  Difficult doing work/chores Somewhat difficult    Scribe for Treatment Team: Jenkins LULLA Primer, LCSWA 07/27/2024 1:01 PM

## 2024-07-27 NOTE — Group Note (Signed)
 Date:  07/27/2024 Time:  10:56 AM  Group Topic/Focus:  Goals Group:   The focus of this group is to help patients establish daily goals to achieve during treatment and discuss how the patient can incorporate goal setting into their daily lives to aide in recovery.    Participation Level:  Active  Participation Quality:  Appropriate  Affect:  Appropriate  Cognitive:  Alert and Appropriate  Insight: Appropriate  Engagement in Group:  Engaged  Modes of Intervention:  Discussion  Additional Comments:  Pt goal is to meet with Child psychotherapist. Pt state that she has not spoken with her worker since she been here. Pt coping skill is reading.  Cassius LOISE Dawn 07/27/2024, 10:56 AM

## 2024-07-27 NOTE — BHH Group Notes (Signed)
 BHH Group Notes:  (Nursing/MHT/Case Management/Adjunct)  Date:  07/27/2024  Time:  8:35 PM  Type of Therapy:  NA Group  Participation Level:  Active  Participation Quality:  Appropriate  Affect:  Appropriate  Cognitive:  Appropriate  Insight:  Appropriate  Engagement in Group:  Engaged  Modes of Intervention:  Education  Summary of Progress/Problems: Attended NA group  Grayce LITTIE Essex 07/27/2024, 8:35 PM

## 2024-07-27 NOTE — Progress Notes (Signed)
 Shift Note  (Sleep Hours) -6.75  (Any PRNs that were needed, meds refused, or side effects to meds)- PRN PO Trazodone    (Any disturbances and when (visitation, over night)-None  (Concerns raised by the patient)- None  (SI/HI/AVH)- Denies

## 2024-07-27 NOTE — Group Note (Deleted)
 Date:  07/27/2024 Time:  3:31 AM  Group Topic/Focus:  Coping With Mental Health Crisis:   The purpose of this group is to help patients identify strategies for coping with mental health crisis.  Group discusses possible causes of crisis and ways to manage them effectively.     Participation Level:  {BHH PARTICIPATION OZCZO:77735}  Participation Quality:  {BHH PARTICIPATION QUALITY:22265}  Affect:  {BHH AFFECT:22266}  Cognitive:  {BHH COGNITIVE:22267}  Insight: {BHH Insight2:20797}  Engagement in Group:  {BHH ENGAGEMENT IN GROUP:22268}  Modes of Intervention:  {BHH MODES OF INTERVENTION:22269}  Additional Comments:  ***  Brandy Hogan 07/27/2024, 3:31 AM

## 2024-07-28 DIAGNOSIS — F429 Obsessive-compulsive disorder, unspecified: Secondary | ICD-10-CM | POA: Diagnosis not present

## 2024-07-28 DIAGNOSIS — F332 Major depressive disorder, recurrent severe without psychotic features: Secondary | ICD-10-CM | POA: Diagnosis not present

## 2024-07-28 DIAGNOSIS — F411 Generalized anxiety disorder: Secondary | ICD-10-CM | POA: Diagnosis not present

## 2024-07-28 MED ORDER — RISPERIDONE 1 MG PO TABS: 1.0000 mg | ORAL_TABLET | Freq: Every day | ORAL | 0 refills | Status: DC

## 2024-07-28 MED ORDER — SERTRALINE HCL 50 MG PO TABS: 50.0000 mg | ORAL_TABLET | Freq: Every day | ORAL | 0 refills | Status: DC

## 2024-07-28 MED ORDER — TRAZODONE HCL 50 MG PO TABS: 50.0000 mg | ORAL_TABLET | Freq: Every evening | ORAL | 0 refills | Status: DC | PRN

## 2024-07-28 MED ORDER — HYDROXYZINE HCL 25 MG PO TABS: 25.0000 mg | ORAL_TABLET | Freq: Three times a day (TID) | ORAL | 0 refills | Status: DC | PRN

## 2024-07-28 NOTE — BHH Suicide Risk Assessment (Signed)
 [facility] Discharge Suicide Risk Assessment   Principal Problem: Major depressive disorder, recurrent episode, severe (HCC) Discharge Diagnoses: Principal Problem:   Major depressive disorder, recurrent episode, severe (HCC) Active Problems:   Obsessive-compulsive disorder, unspecified type   Generalized anxiety disorder   Total Time spent with patient: 30 minutes  Musculoskeletal: Strength & Muscle Tone: within normal limits Gait & Station: normal Patient leans: N/A  Psychiatric Specialty Exam  Presentation  General Appearance:  Casually dressed, not in any distress, appropriate behavior, engaged politely.  No EPS.  Eye Contact: Good.  Speech: Spontaneous.  Normal rate, tone and volume.  Normal prosody of speech.  Mood and Affect  Mood: Euthymic.  Affect: Full range and appropriate.  Thought Process  Thought Processes: Linear and goal directed.  Descriptions of Associations:Intact  Orientation:Full (Time, Place and Person)  Thought Content: Future oriented.  No current suicidal thoughts.  No homicidal thoughts.  No thoughts of violence.  No negative ruminative flooding.  No guilty ruminations.  No delusional theme.  No obsessions.  Hallucinations: No hallucination in any modality.  Sensorium  Memory: Good.  Judgment: Good.  Insight: Good  Executive Functions  Concentration: Good.  Attention Span: Good.  Recall: Good.  Fund of Knowledge: Good.  Language: Good   Psychomotor Activity  Normal psychomotor activity   Physical Exam: Physical Exam ROS Blood pressure 115/75, pulse 71, temperature 98.5 F (36.9 C), temperature source Oral, resp. rate 14, height 4' 11.75 (1.518 m), weight 49 kg, last menstrual period 09/08/2012, SpO2 99%. Body mass index is 21.27 kg/m.  Mental Status Per Nursing Assessment::   On Admission:  NA  Demographic Factors:  NA  Loss Factors: NA  Historical Factors: Family history of mental illness or  substance abuse  Risk Reduction Factors:   Positive social support, Positive therapeutic relationship, and Positive coping skills or problem solving skills  Continued Clinical Symptoms:  Psychosis has completely resolved.  Patient is no longer dwelling on any obsessions or compulsions.  She is sleeping well at night.  She is tolerating her medicines well.  Cognitive Features That Contribute To Risk:  None    Suicide Risk:  Minimal: No current suicidal thoughts.  No current homicidal thoughts.  No current thoughts of violence.  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  She has responded well to psychotropic medication adjustment.  There are no new psychosocial stressors. Patient is stable for care at the lower setting.  Follow-up Information     [facility] Follow up on 07/29/2024.   Why: Please call your provider on 07/29/24 at 9:00 am to schedule an appointment for therapy services, as we were unable to acquire prior to your discharge. Contact information: [address] [place] [address] [phone]         [facility] Go on 08/17/2024.   Why: You have an appointment for medication management services on 08/17/24 at 4:50 pm.  The appointment will be Virtual, but you may call to switch to in person. Contact information: [address] [place] [address] [phone]         [facility] Follow up.   Why: This is another provider you may call to schedule therapy and/or medication management with. Contact information: [address] [place] [address] [phone]                 Plan Of Care/Follow-up recommendations:  See discharge  summary  Jerrell DELENA Forehand, MD 07/28/2024, 10:19 AM

## 2024-07-28 NOTE — Progress Notes (Signed)
  [facility] Adult Case Management Discharge Plan :  Will you be returning to the same living situation after discharge:  Yes,  pt returning home after discharge.  At discharge, do you have transportation home?: Yes,  pt reported family will provide transportation home.  Do you have the ability to pay for your medications: Yes,  pt is insured - [employer] PPO  Release of information consent forms completed and in the chart;  Patient's signature needed at discharge.  Patient to Follow up at:  Follow-up Information     [facility] Follow up on 07/29/2024.   Why: Please call your provider on 07/29/24 at 9:00 am to schedule an appointment for therapy services, as we were unable to acquire prior to your discharge. Contact information: [address] [place] [address] [phone]         [facility] Go on 08/17/2024.   Why: You have an appointment for medication management services on 08/17/24 at 4:50 pm.  The appointment will be Virtual, but you may call to switch to in person. Contact information: [address] [place] [address] [phone]         [facility] Follow up.   Why: This is another provider you may call to schedule therapy and/or medication management with. Contact information: [address] [place] [address] [phone]                 Next level of care provider has access to [facility] Link:no  Safety Planning and Suicide Prevention discussed: Yes,  completed with Retta Pitcher (spouse) [phone]     Has patient been referred to the Quitline?: Patient does not use tobacco/nicotine products  Patient has been referred for addiction treatment: No known substance use disorder.  Dafney Farler M Arno Cullers, LCSWA 07/28/2024, 9:43 AM

## 2024-07-28 NOTE — Progress Notes (Signed)
 Patient discharged from [facility] on 07/28/24. Patient denies SI, plan, and intention. Suicide safety plan completed, reviewed with this RN, given to the patient, and a copy in the chart. Patient denies HI/AVH upon discharge. Patient is alert, oriented, and cooperative. RN provided patient with discharge paperwork and reviewed information with patient. Patient expressed that she understood all of the discharge instructions. Pt was satisfied with belongings returned to her from the locker and at bedside. Discharged patient to [facility] waiting room. Pt's husband awaiting patient in the [facility] waiting room.

## 2024-07-28 NOTE — Plan of Care (Signed)
   Problem: Education: Goal: Knowledge of Brandy Hogan General Education information/materials will improve Outcome: Progressing Goal: Emotional status will improve Outcome: Progressing Goal: Mental status will improve Outcome: Progressing Goal: Verbalization of understanding the information provided will improve Outcome: Progressing   Problem: Activity: Goal: Interest or engagement in activities will improve Outcome: Progressing Goal: Sleeping patterns will improve Outcome: Progressing   Problem: Coping: Goal: Ability to verbalize frustrations and anger appropriately will improve Outcome: Progressing Goal: Ability to demonstrate self-control will improve Outcome: Progressing

## 2024-07-28 NOTE — Discharge Summary (Signed)
 Physician Discharge Summary Note  Patient:  Brandy Hogan is an 58 y.o., female  MRN:  [id number]  DOB:  Oct 15, 1966  Patient phone:  [phone] (home)   Patient address:   [address] [place] [address],   Total Time spent with patient: 45 minutes  Date of Admission:  07/22/2024  Date of Discharge: 07-28-24  Reason for Admission: Worsening insomnia/anxiety & intrusive thoughts of an impending doom on her family.   Principal Problem: Major depressive disorder, recurrent episode, severe (HCC) Discharge Diagnoses: Principal Problem:   Major depressive disorder, recurrent episode, severe (HCC) Active Problems:   Obsessive-compulsive disorder, unspecified type   Generalized anxiety disorder  Past Psychiatric History: See H&P.  Past Medical History:  Past Medical History:  Diagnosis Date   Asthma    History of admission to inpatient psychiatry department 12/12/2022   [facility] 10/2022   Psychosis [facility])    History reviewed. No pertinent surgical history. Family History:  Family History  Problem Relation Age of Onset   Schizophrenia Mother    Bipolar disorder Sister    Family Psychiatric  History: See H&P.  Social History:  Social History   Substance and Sexual Activity  Alcohol Use Never     Social History   Substance and Sexual Activity  Drug Use Never    Social History   Socioeconomic History   Marital status: Married    Spouse name: Not on file   Number of children: Not on file   Years of education: Not on file   Highest education level: Not on file  Occupational History   Not on file  Tobacco Use   Smoking status: Never   Smokeless tobacco: Never  Substance and Sexual Activity   Alcohol use: Never   Drug use: Never   Sexual activity: Yes  Other Topics Concern   Not on file  Social History Narrative   Not on file   Social Drivers of Health   Financial Resource Strain: Not on file  Food Insecurity: No Food Insecurity  (07/22/2024)   Hunger Vital Sign    Worried About Running Out of Food in the Last Year: Never true    Ran Out of Food in the Last Year: Never true  Transportation Needs: No Transportation Needs (07/22/2024)   PRAPARE - Administrator, Civil Service (Medical): No    Lack of Transportation (Non-Medical): No  Physical Activity: Not on file  Stress: Not on file  Social Connections: Not on file   Hospital Course: (Per admission evaluation notes): 58 year old AA female with prior hx of mental illnesses. Patient is known in this [facility] from her previous admission in the early part of 2024. At the time, she presented with worsening psychosis. She was at the time treated, stabilized & discharged with an outpatient psychiatric services for routine psychiatric care, counseling services & medication management.She is being re-admitted to the [facility] from the [facility] with complain of worsening insomnia/anxiety & intrusive thoughts of an impending doom on her family. After being evaluated at the [facility], patient was transferred to the [facility] for further psychiatric evaluation/treatments.   This is the second psychiatric admission/discharge summaries for this 58 year old AA female with hx of mental illness. Brandy Hogan was a patient in this [facility] in the early part of 2024 with complaint of worsening psychosis. She was treated, stabilized & discharged at the time with medications & outpatient psychiatric services to help her maintain mood stability. Patient reported on  this present admission that she did not comply with the discharge recommendations after she got home. She stated that she stopped taking her medicines as well. This means that she was not compliant with her treatment recommendations. After evaluation of her presenting symptoms this time, Brandy Hogan was recommended for mood stabilization treatments.   The medication regimen targeting those presenting symptoms were discussed & initiated with her consent. She was treated,  stabilized & discharged on the medications as listed below on her discharge medication lists. She was also enrolled & participated in the group counseling sessions being offered & held on this unit. She learned coping skills. She presented other significant pre-existing medical issues that required treatment & or monitoring. She was resumed & discharged on the medication used for those symptoms. She tolerated her treatment regimen without any adverse effects or reactions reported.    Brandy Hogan's symptoms responded well to her treatment regimen. This is evidenced by her reports of improved mood, resolution of symptoms, presentation of good affect/eye contact & absence of other mood symptoms. She presents currently mentally & medically stable for discharge to continue mental health care on an outpatient basis as recommended below.    Today upon her discharge evaluation with her treatment team, Brandy Hogan shares, I'm feeling better & I'm ready to be discharged. My mood is good & my sleep has improved. She denies any other specific concerns. She is sleeping well. Her appetite is good. She denies any other physical complaints. She denies SI/HI/AH/VH, delusional thoughts or paranoia. She is tolerating her medications well & in agreement to continue her current regimen as recommended. She will follow up for routine psychiatric care & medication management as noted below. She is provided with all the necessary information needed to make these appointments without problems. She was able to engage in safety planning including plan to return to [facility] or contact emergency services if she feels unable to maintain her own safety or the safety of others. Patient has no further questions, comments or concerns. She left [facility] with all personal belongings in no apparent distress. Transportation per her husband.  Physical Findings: AIMS:  , ,  ,  ,  ,  ,   CIWA:    COWS:     Musculoskeletal: Strength & Muscle Tone: within normal  limits Gait & Station: normal Patient leans: N/A   Psychiatric Specialty Exam:  Presentation  General Appearance:  Casual; Fairly Groomed  Eye Contact: Good  Speech: Clear and Coherent; Normal Rate  Speech Volume: Normal  Handedness: Right  Mood and Affect  Mood: Euthymic  Affect: Appropriate; Congruent  Thought Process  Thought Processes: Coherent; Linear; Goal Directed  Descriptions of Associations:Intact  Orientation:Full (Time, Place and Person)  Thought Content:Logical  History of Schizophrenia/Schizoaffective disorder:Yes  Duration of Psychotic Symptoms:Greater than six months  Hallucinations:Hallucinations: None  Ideas of Reference:None  Suicidal Thoughts:Suicidal Thoughts: No  Homicidal Thoughts:Homicidal Thoughts: No  Sensorium  Memory: Immediate Good; Recent Good; Remote Good  Judgment: Good  Insight: Good  Executive Functions  Concentration: Good  Attention Span: Good  Recall: Good  Fund of Knowledge: Fair  Language: Good  Psychomotor Activity  Psychomotor Activity: Psychomotor Activity: Normal  Assets  Assets: Communication Skills; Desire for Improvement; Financial Resources/Insurance; Housing; Resilience; Social Support  Sleep  Sleep: Sleep: Good  Estimated Sleeping Duration (Last 24 Hours): 6.00-8.00 hours  Physical Exam: Physical Exam Vitals and nursing note reviewed.  HENT:     Head: Normocephalic.     Nose: Nose normal.  Cardiovascular:  Rate and Rhythm: Normal rate.     Pulses: Normal pulses.  Pulmonary:     Effort: Pulmonary effort is normal.  Genitourinary:    Comments: Deferred Musculoskeletal:        General: Normal range of motion.     Cervical back: Normal range of motion.  Skin:    General: Skin is dry.  Neurological:     General: No focal deficit present.     Mental Status: She is alert and oriented to person, place, and time.    Review of Systems  Constitutional:   Negative for chills, diaphoresis and fever.  HENT:  Negative for congestion and sore throat.   Respiratory:  Negative for cough, shortness of breath and wheezing.   Cardiovascular:  Negative for chest pain and palpitations.  Gastrointestinal:  Negative for abdominal pain, constipation, diarrhea, heartburn, nausea and vomiting.  Genitourinary:  Negative for dysuria.  Musculoskeletal:  Negative for joint pain and myalgias.  Neurological:  Negative for dizziness, tingling, tremors, sensory change, speech change, focal weakness, seizures, loss of consciousness, weakness and headaches.  Endo/Heme/Allergies:        See allergy lists.  Psychiatric/Behavioral:  Positive for depression (Hx of (improved on medication).). Negative for hallucinations, memory loss, substance abuse and suicidal ideas. The patient has insomnia (Hx of (stable on medication).). The patient is not nervous/anxious (Stable upon discharge.).    Blood pressure 115/75, pulse 71, temperature 98.5 F (36.9 C), temperature source Oral, resp. rate 14, height 4' 11.75 (1.518 m), weight 49 kg, last menstrual period 09/08/2012, SpO2 99%. Body mass index is 21.27 kg/m.   Social History   Tobacco Use  Smoking Status Never  Smokeless Tobacco Never   Tobacco Cessation:  N/A, patient does not currently use tobacco products  Blood Alcohol level:  Lab Results  Component Value Date   [facility] <15 07/21/2024   ETH <10 12/15/2023   Metabolic Disorder Labs:  Lab Results  Component Value Date   HGBA1C 5.6 07/21/2024   MPG 114 07/21/2024   MPG 123 12/15/2023   Lab Results  Component Value Date   PROLACTIN 12.7 11/07/2022   Lab Results  Component Value Date   CHOL 254 (H) 07/21/2024   TRIG 54 07/21/2024   HDL 109 07/21/2024   CHOLHDL 2.3 07/21/2024   VLDL 11 07/21/2024   LDLCALC 134 (H) 07/21/2024   LDLCALC 172 (H) 12/15/2023   See Psychiatric Specialty Exam and Suicide Risk Assessment completed by Attending Physician prior to  discharge.  Discharge destination:  Home  Is patient on multiple antipsychotic therapies at discharge:  No   Has Patient had three or more failed trials of antipsychotic monotherapy by history:  No  Recommended Plan for Multiple Antipsychotic Therapies: NA  Allergies as of 07/28/2024       Reactions   Dairycare [bacid] Anaphylaxis   Egg-derived Products Anaphylaxis   Methylprednisolone Acetate Anaphylaxis, Other (See Comments)   Throat Swelling and Airway Obstruction   Banana Swelling, Other (See Comments)   Eye swelling   Shellfish Allergy Other (See Comments)   I was told to stay away from it   Pineapple Itching   Watermelon [citrullus Vulgaris] Other (See Comments)   Tongue tingling        Medication List     STOP taking these medications    ADULT GUMMY PO   cetirizine 10 MG tablet Commonly known as: ZYRTEC   diphenhydrAMINE  25 mg capsule Commonly known as: BENADRYL    hydrOXYzine  25 MG capsule Commonly  known as: VISTARIL        TAKE these medications      Indication  albuterol  108 (90 Base) MCG/ACT inhaler Commonly known as: VENTOLIN  HFA Inhale into the lungs every 6 (six) hours as needed for wheezing or shortness of breath.  Indication: Spasm of Lung Air Passages   hydrOXYzine  25 MG tablet Commonly known as: ATARAX  Take 1 tablet (25 mg total) by mouth 3 (three) times daily as needed for anxiety.  Indication: Feeling Anxious   risperiDONE  1 MG tablet Commonly known as: RISPERDAL  Take 1 tablet (1 mg total) by mouth at bedtime. For mood control  Indication: Mood control.   sertraline  50 MG tablet Commonly known as: ZOLOFT  Take 1 tablet (50 mg total) by mouth daily. For depression/anxiety Start taking on: July 29, 2024  Indication: Generalized Anxiety Disorder, Major Depressive Disorder   traZODone  50 MG tablet Commonly known as: DESYREL  Take 1 tablet (50 mg total) by mouth at bedtime as needed for sleep.  Indication: Trouble Sleeping         Follow-up Information     [facility] Follow up on 07/29/2024.   Why: Please call your provider on 07/29/24 at 9:00 am to schedule an appointment for therapy services, as we were unable to acquire prior to your discharge. Contact information: [address] [place] [address] [phone]         [facility] Go on 08/17/2024.   Why: You have an appointment for medication management services on 08/17/24 at 4:50 pm.  The appointment will be Virtual, but you may call to switch to in person. Contact information: [address] [place] [address] [phone]         [facility] Follow up.   Why: This is another provider you may call to schedule therapy and/or medication management with. Contact information: [address] [place] [address] [phone]                Plan Of Care/Follow-up recommendations:  Activity: as tolerated Diet: heart healthy  Other: -Follow-up with your outpatient psychiatric provider -instructions on appointment date, time, and address (location) are provided to you in discharge paperwork.  -Take your psychiatric medications as prescribed at discharge - instructions are provided to you in the discharge paperwork  -Follow-up with outpatient primary care doctor and other specialists -for management of preventative medicine and chronic medical issues  -Testing: Follow-up with outpatient provider for abnormal lab results: NA  -If you are prescribed an atypical antipsychotic medication, we recommend that your outpatient psychiatrist follow routine screening for side effects within 3 months of discharge, including monitoring: AIMS scale, height, weight, blood pressure, fasting lipid panel, HbA1c, and fasting blood sugar.   -Recommend total abstinence from alcohol, tobacco, and other illicit drug use at discharge.   -If your psychiatric symptoms recur, worsen, or if you  have side effects to your psychiatric medications, call your outpatient psychiatric provider, 911, [phone] or go to the nearest emergency department.  -If suicidal thoughts occur, immediately call your outpatient psychiatric provider, 911, [phone] or go to the nearest emergency department.   Signed: Mac Bolster, NP, pmhnp, fnp-bc. 07/28/2024, 9:58 AM

## 2024-07-28 NOTE — Group Note (Signed)
 LCSW Group Therapy Note   Group Date: 07/28/2024 Start Time: 1100 End Time: 1200  Participation:  patient was present  Type of Therapy:  Group Therapy   Topic:  Stress Less:  Nurturing Your Mind and Body Through Calm   Objective:  Learn techniques for managing stress through body relaxation, mindfulness, and self-compassion.  Goals: Use body relaxation techniques, such as Box Breathing and Progressive Muscle Relaxation, to reduce physical tension. Practice mindfulness to break the cycle of overthinking and mental chatter. Embrace self-compassion to handle stress with kindness and resilience.  Summary:  Today's session focused on calming the body with relaxation techniques, breaking the cycle of stress with mindfulness, and using self-compassion to manage challenges more gracefully. These tools help reduce stress and foster a balanced, peaceful mindset.  Therapeutic Modalities used:  Elements of CBT ( cognitive restructuring)  Elements of DBT (box breathing, progressive body relaxation, mindfulness, acceptance)    Brandy Hogan Brandy Hogan, LCSWA 07/28/2024  12:16 PM

## 2024-07-28 NOTE — Progress Notes (Signed)
   07/28/24 0829  Psych Admission Type (Psych Patients Only)  Admission Status Voluntary  Psychosocial Assessment  Patient Complaints None  Eye Contact Fair  Facial Expression Flat  Affect Anxious  Speech Logical/coherent  Interaction Guarded  Motor Activity Slow  Appearance/Hygiene Unremarkable  Behavior Characteristics Cooperative  Mood Depressed;Pleasant  Thought Process  Coherency WDL  Content WDL  Delusions None reported or observed  Perception WDL  Hallucination None reported or observed  Judgment Impaired  Confusion None  Danger to Self  Current suicidal ideation? Denies  Description of Suicide Plan Pt verbally contracts for safety  Agreement Not to Harm Self Yes  Description of Agreement Pt verbally contracts for safety  Danger to Others  Danger to Others None reported or observed

## 2024-07-28 NOTE — Progress Notes (Signed)
(  Sleep Hours) - 8 (Any PRNs that were needed, meds refused, or side effects to meds)- trazodone  50 mg given at pt request, no meds refused.  (Any disturbances and when (visitation, over night)- None  (Concerns raised by the patient)- None  (SI/HI/AVH)- Denies SI/HI/AVH

## 2024-07-28 NOTE — BHH Group Notes (Signed)
 Adult Psychoeducational Group Note  Date:  07/28/2024 Time:  9:36 AM  Group Topic/Focus:  Goals Group:   The focus of this group is to help patients establish daily goals to achieve during treatment and discuss how the patient can incorporate goal setting into their daily lives to aide in recovery.  Orientation:   The focus of this group is to educate the patient on the purpose and policies of crisis stabilization and provide a format to answer questions about their admission.  The group details unit policies and expectations of patients while admitted.  Participation Level:  Active  Participation Quality:  Appropriate  Affect:  Appropriate  Cognitive:  Alert  Insight: Appropriate  Engagement in Group:  Engaged  Modes of Intervention:  Orientation  Additional Comments:  Pt attended and participated in orientation/goals group. Pt participated in icebreaker activity. Pt goal for today is to discharge and get reconnected with her family.   Niel CHRISTELLA Nightingale 07/28/2024, 9:36 AM

## 2024-07-28 NOTE — Plan of Care (Signed)
   Problem: Education: Goal: Emotional status will improve Outcome: Progressing Goal: Mental status will improve Outcome: Progressing Goal: Verbalization of understanding the information provided will improve Outcome: Progressing   Problem: Activity: Goal: Interest or engagement in activities will improve Outcome: Progressing

## 2024-07-29 NOTE — B[facility] Group Notes (Signed)
 Spirituality group facilitated by Elia Rockie Sofia, BCC.  Group Description: Group focused on topic of hope. Patients participated in facilitated discussion around topic, connecting with one another around experiences and definitions for hope. Group members engaged with visual explorer photos, reflecting on what hope looks like for them today. Group engaged in discussion around how their definitions of hope are present today in hospital.  Modalities: Psycho-social ed, Adlerian, Narrative, MI  Patient Progress: Jazzmin attended group.  She did not participate verbally but she demonstrated engagement in the group conversation.

## 2024-08-16 ENCOUNTER — Ambulatory Visit (INDEPENDENT_AMBULATORY_CARE_PROVIDER_SITE_OTHER)
Admission: EM | Admit: 2024-08-16 | Discharge: 2024-08-17 | Disposition: A | Discharge status: Home / Self Care | Source: Home / Self Care

## 2024-08-16 DIAGNOSIS — F411 Generalized anxiety disorder: Secondary | ICD-10-CM | POA: Insufficient documentation

## 2024-08-16 DIAGNOSIS — F061 Catatonic disorder due to known physiological condition: Secondary | ICD-10-CM | POA: Insufficient documentation

## 2024-08-16 DIAGNOSIS — F203 Undifferentiated schizophrenia: Secondary | ICD-10-CM | POA: Insufficient documentation

## 2024-08-16 DIAGNOSIS — R4182 Altered mental status, unspecified: Secondary | ICD-10-CM | POA: Insufficient documentation

## 2024-08-16 DIAGNOSIS — R4589 Other symptoms and signs involving emotional state: Secondary | ICD-10-CM | POA: Diagnosis not present

## 2024-08-16 MED ORDER — OLANZAPINE 5 MG PO TBDP: 5.0000 mg | ORAL_TABLET | Freq: Three times a day (TID) | ORAL | Status: DC | PRN

## 2024-08-16 MED ORDER — ACETAMINOPHEN 325 MG PO TABS: 650.0000 mg | ORAL_TABLET | Freq: Four times a day (QID) | ORAL | Status: DC | PRN

## 2024-08-16 MED ORDER — OLANZAPINE 5 MG PO TABS
5.0000 mg | ORAL_TABLET | Freq: Every day | ORAL | Status: DC
  Administered 2024-08-17: 5 mg via ORAL
  Filled 2024-08-16: qty 1

## 2024-08-16 MED ORDER — LORAZEPAM 1 MG PO TABS
1.0000 mg | ORAL_TABLET | Freq: Once | ORAL | Status: AC
  Administered 2024-08-17: 1 mg via ORAL
  Filled 2024-08-16: qty 1

## 2024-08-16 MED ORDER — MAGNESIUM HYDROXIDE 400 MG/5ML PO SUSP: 30.0000 mL | Freq: Every day | ORAL | Status: DC | PRN

## 2024-08-16 MED ORDER — ALUM & MAG HYDROXIDE-SIMETH 200-200-20 MG/5ML PO SUSP: 30.0000 mL | ORAL | Status: DC | PRN

## 2024-08-16 MED ORDER — OLANZAPINE 10 MG IM SOLR: 5.0000 mg | Freq: Three times a day (TID) | INTRAMUSCULAR | Status: DC | PRN

## 2024-08-16 MED ORDER — EPINEPHRINE 0.3 MG/0.3ML IJ SOAJ: 0.3000 mg | Freq: Every day | INTRAMUSCULAR | Status: DC | PRN

## 2024-08-16 NOTE — Progress Notes (Signed)
   08/16/24 2130  [facility] Triage Screening (Walk-ins at [facility] only)  How Did You Hear About Us ? Family/Friend  What Is the Reason for Your Visit/Call Today? Pt was brought to [facility] by her husband, [name].  Pt has a history of undifferentiated schizophrenia, MDD and intrusive thoughts.  Currently she is denying SI or HI.  Patient is unclear about whether she is seeing or hearing things.  Pt denies any previous suicide attempt.  Pt has been having a lot of worry and anxiety.  Pt denies any use of ETOH or other substances.  Patient denie saccess to guns.  Patient does not look at clinician but looks to the side and makes noises.  Whispers yes or no (which isn't always clear) for answers.  Patient does say she has had some sleep.  It is unclear whether she has been eating well.  How Long Has This Been Causing You Problems? 1 wk - 1 month  Have You Recently Had Any Thoughts About Hurting Yourself? No  How long ago did you have thoughts about hurting yourself? Pt denies today but three weeks ago had thoughts.  Are You Planning to Commit Suicide/Harm Yourself At This time? No  Have you Recently Had Thoughts About Hurting Someone [name]? No  Are You Planning To Harm Someone At This Time? No  Explanation: No current SI or HI.  Physical Abuse Denies  Verbal Abuse Yes, past (Comment)  Sexual Abuse Denies  Exploitation of patient/patient's resources Denies  Self-Neglect Denies  Possible abuse reported to:  (N/A)  Are you currently experiencing any auditory, visual or other hallucinations? No (Unknown)  Have You Used Any Alcohol or Drugs in the Past 24 Hours? No  Do you have any current medical co-morbidities that require immediate attention? No  Clinician description of patient physical appearance/behavior: Pt is nervous, anxious and barely able to speak.  Pt is almost catatonic with her responses.  What Do You Feel Would Help You the Most Today? Treatment for Depression or other mood problem  If access to  [facility] was not available, would you have sought care in the Emergency Department? No  Determination of Need Urgent (48 hours)  Options For Referral [facility];Inpatient Hospitalization;Medication Management  Determination of Need filed? Yes

## 2024-08-16 NOTE — ED Provider Notes (Signed)
 [facility] Continuous Assessment Admission H&P  Date: 08/17/24 Patient Name: Brandy Hogan MRN: [id number] Chief Complaint: Patient has thought blocking.   Diagnoses:  Final diagnoses:  Undifferentiated schizophrenia (HCC)  Anxious appearance  Catatonia  Altered mental status, unspecified altered mental status type    HPI: Brandy Hogan. Sur is a 58 year old female with psychiatric history of MDD, GAD, OCD-unspecified type, psychosis and undifferentiated schizophrenia, presented voluntarily as a walk-in to [facility] accompanied by her husband, due to increased worry and anxiety and seeking psychiatric stabilization.  Patient was seen face to face by this provider and chart reviewed.  Per chart review, patient was recently admitted at the [facility] between 07/22/24-07/28/24 for worsening insomnia/anxiety & intrusive thoughts of an impending doom on her family.   Tonight, patient presents as catatonic with thought blocking.  She does not make eye contact and stares off.  She appears to be responding to internal stimuli. she is observed mumbling continuously to herself and muted tones.  When asked a question, she mumbles in low tones erm....erm...SABRASABRAI don't know.....something.....SABRASABRAShe appears anxious. Patient sits still and does not react or make eye contact as she continuously mumbles.  Patient is unable to engage or make a sentence.  Unable to continue with the evaluation due to the patient's current altered mental status.  On evaluation, patient is alert, unable to assess orientation. She is cooperative. Speech is incoherent in muted tones with thought blocking. Pt appears fairly groomed. There is no eye contact. Mood is anxious, affect is congruent with mood. Unable to assess thought process and thought content. Unable to assess for SI/HI/AVH. There is objective indication that the patient is responding to internal stimuli as she is staring off and mumbling continuously to herself.  She  does not react to other people's presence.  No delusions elicited during this assessment.    Collateral information is obtained from the patient's husband [name], who presents as a very poor historian.  [name] reports noticing the past few days the patient has been decompensating into her current presentation and it worsened a day or 2 ago. He reports patient has been prescribed 3 medications from her last inpatient psychiatric admission but he is not able to confirm if patient is medication adherent.  He reports that the patient stopped taking one of the medications due to a  reaction where her eye was swollen.  He reports asking the patient to get back on the medication after her symptoms started. He reports the patient sees an outpatient psychiatric provider where he is not able to remember the name.  He reports the patient is currently not going to therapist.  Discussed recommendation for inpatient psychiatric admission for stabilization and treatment.  Discussed inpatient milieu and expectations.  Patient and her husband are provided with opportunity for questions.  The patient's husband verbalizes understanding and is in agreement. Patient will be admitted to the continuous observation unit for safety monitoring/treatment pending transfer to an inpatient psychiatric unit. LCSW will seek bed placement.  Total Time spent with patient: 45 minutes  Musculoskeletal  Strength & Muscle Tone: within normal limits Gait & Station: normal Patient leans: N/A  Psychiatric Specialty Exam  Presentation General Appearance:  Fairly Groomed  Eye Contact: None  Speech: Blocked  Speech Volume: Decreased  Handedness: Right   Mood and Affect  Mood: Anxious  Affect: Congruent   Thought Process  Thought Processes: Other (comment) (UTA)  Descriptions of Associations:Loose (UTA)  Orientation:Other (comment) (UTA)  Thought Content:Other (comment) (  UTA)  Diagnosis of Schizophrenia or  Schizoaffective disorder in past: Yes  Duration of Psychotic Symptoms: Less than six months  Hallucinations:Hallucinations: Other (comment) (UTA)  Ideas of Reference:Other (comment) (UTA)  Suicidal Thoughts:Suicidal Thoughts: No (UTA)  Homicidal Thoughts:Homicidal Thoughts: No (UTA)   Sensorium  Memory: Immediate Poor  Judgment: Impaired  Insight: Other (comment) (UTA)   Executive Functions  Concentration: Poor  Attention Span: Poor  Recall: Poor  Fund of Knowledge: Poor  Language: Poor   Psychomotor Activity  Psychomotor Activity: Psychomotor Activity: Mannerisms   Assets  Assets: Desire for Improvement   Sleep  Sleep: Sleep: Poor   Nutritional Assessment (For OBS and FBC admissions only) Has the patient had a weight loss or gain of 10 pounds or more in the last 3 months?: No Has the patient had a decrease in food intake/or appetite?: No Does the patient have dental problems?: No Does the patient have eating habits or behaviors that may be indicators of an eating disorder including binging or inducing vomiting?: No Has the patient recently lost weight without trying?: 0 Has the patient been eating poorly because of a decreased appetite?: 0 Malnutrition Screening Tool Score: 0    Physical Exam Constitutional:      General: She is not in acute distress.    Appearance: She is not diaphoretic.  HENT:     Nose: No congestion.  Pulmonary:     Effort: No respiratory distress.  Chest:     Chest wall: No tenderness.  Neurological:     General: No focal deficit present.     Mental Status: She is alert.  Psychiatric:        Attention and Perception: She is inattentive.        Mood and Affect: Mood is anxious and depressed. Affect is inappropriate.        Speech: She is noncommunicative.        Behavior: Behavior is cooperative.    Review of Systems  Constitutional:  Negative for chills, diaphoresis and fever.  HENT:  Negative for  congestion.   Eyes:  Negative for discharge.  Respiratory:  Negative for cough, shortness of breath and wheezing.   Cardiovascular:  Negative for chest pain and palpitations.  Gastrointestinal:  Negative for diarrhea, nausea and vomiting.  Neurological:  Negative for dizziness, seizures, loss of consciousness and headaches.  Psychiatric/Behavioral:  The patient is nervous/anxious and has insomnia.     Blood pressure (!) 142/90, pulse (!) 106, temperature 98.6 F (37 C), temperature source Oral, resp. rate 18, last menstrual period 09/08/2012, SpO2 100%. There is no height or weight on file to calculate BMI.  Past Psychiatric History: See H & P   Is the patient at risk to self? Yes  Has the patient been a risk to self in the past 6 months? Yes .    Has the patient been a risk to self within the distant past? Yes   Is the patient a risk to others? No   Has the patient been a risk to others in the past 6 months? No   Has the patient been a risk to others within the distant past? No   Past Medical History: See Chart  Family History: N/A  Social History: N/A  Last Labs:  Admission on 08/16/2024  Component Date Value Ref Range Status   Preg Test, Ur 08/17/2024 Negative  Negative Final   POC Amphetamine UR 08/17/2024 None Detected  NONE DETECTED (Cut Off Level 1000 ng/mL) Final  POC Secobarbital (BAR) 08/17/2024 None Detected  NONE DETECTED (Cut Off Level 300 ng/mL) Final   POC Buprenorphine (BUP) 08/17/2024 None Detected  NONE DETECTED (Cut Off Level 10 ng/mL) Final   POC Oxazepam (BZO) 08/17/2024 None Detected  NONE DETECTED (Cut Off Level 300 ng/mL) Final   POC Cocaine UR 08/17/2024 None Detected  NONE DETECTED (Cut Off Level 300 ng/mL) Final   POC Methamphetamine UR 08/17/2024 None Detected  NONE DETECTED (Cut Off Level 1000 ng/mL) Final   POC Morphine 08/17/2024 None Detected  NONE DETECTED (Cut Off Level 300 ng/mL) Final   POC Methadone UR 08/17/2024 None Detected  NONE DETECTED  (Cut Off Level 300 ng/mL) Final   POC Oxycodone UR 08/17/2024 None Detected  NONE DETECTED (Cut Off Level 100 ng/mL) Final   POC Marijuana UR 08/17/2024 None Detected  NONE DETECTED (Cut Off Level 50 ng/mL) Final  Admission on 07/22/2024, Discharged on 07/28/2024  Component Date Value Ref Range Status   Folate 07/22/2024 21.7  >5.9 ng/mL Final   Performed at [facility], [address] [place] [address], [place], [place] [address]   RPR Ser Ql 07/22/2024 NON REACTIVE  NON REACTIVE Final   Performed at [facility], [address] [place], [place] [address]   Vit D, 25-Hydroxy 07/22/2024 49.17  30 - 100 ng/mL Final   Comment: (NOTE) Vitamin D  deficiency has been defined by the Institute of Medicine  and an Endocrine Society practice guideline as a level of serum 25-OH  vitamin D  less than 20 ng/mL (1,2). The Endocrine Society went on to  further define vitamin D  insufficiency as a level between 21 and 29  ng/mL (2).  1. IOM (Institute of Medicine). 2010. Dietary reference intakes for  calcium and D. [place]: [employer]. 2. Holick MF, Binkley , Bischoff-Ferrari HA, et al. Evaluation,  treatment, and prevention of vitamin D  deficiency: an Endocrine  Society clinical practice guideline, JCEM. 2011 Jul; 96(7): 1911-30.  Performed at [facility], [address] [place], [place] [address]    HIV Screen 4th Generation wRfx 07/22/2024 Non Reactive  Non Reactive Final   Performed at [facility], [address] [place], [place] [address]  Admission on 07/21/2024, Discharged on 07/22/2024  Component Date Value Ref Range Status   Sodium 07/21/2024 138  135 - 145 mmol/L Final   Potassium 07/21/2024 4.2  3.5 - 5.1 mmol/L Final   Chloride 07/21/2024 107  98 - 111 mmol/L Final   CO2 07/21/2024 25  22 - 32 mmol/L Final   Glucose, Bld 07/21/2024 96  70 - 99 mg/dL Final   Glucose reference range applies only to samples taken after fasting for at least  8 hours.   BUN 07/21/2024 13  6 - 20 mg/dL Final   Creatinine, Ser 07/21/2024 0.92  0.44 - 1.00 mg/dL Final   Calcium 91/92/7974 10.0  8.9 - 10.3 mg/dL Final   Total Protein 91/92/7974 7.6  6.5 - 8.1 g/dL Final   Albumin 91/92/7974 4.5  3.5 - 5.0 g/dL Final   AST 91/92/7974 23  15 - 41 U/L Final   ALT 07/21/2024 18  0 - 44 U/L Final   Alkaline Phosphatase 07/21/2024 49  38 - 126 U/L Final   Total Bilirubin 07/21/2024 0.5  0.0 - 1.2 mg/dL Final   GFR, Estimated 07/21/2024 >60  >60 mL/min Final   Comment: (NOTE) Calculated using the CKD-EPI Creatinine Equation (2021)    Anion gap 07/21/2024  6  5 - 15 Final   Performed at [facility], [address] [place], [place] [address]   Hgb A1c MFr Bld 07/21/2024 5.6  4.8 - 5.6 % Final   Comment: (NOTE)         Prediabetes: 5.7 - 6.4         Diabetes: >6.4         Glycemic control for adults with diabetes: <7.0    Mean Plasma Glucose 07/21/2024 114  mg/dL Final   Comment: (NOTE) Performed At: [facility] [address] [place], [place] [id number] Jennette Shorter MD Ey:[id number]    Magnesium  07/21/2024 2.1  1.7 - 2.4 mg/dL Final   Performed at [facility], [address] [place], [place] [address]   Alcohol, Ethyl (B) 07/21/2024 <15  <15 mg/dL Final   Comment: (NOTE) For medical purposes only. Performed at [facility], [address], [place], [place] [address]    Cholesterol 07/21/2024 254 (H)  0 - 200 mg/dL Final   Triglycerides 91/92/7974 54  <150 mg/dL Final   HDL 91/92/7974 109  >40 mg/dL Final   Total CHOL/HDL Ratio 07/21/2024 2.3  RATIO Final   VLDL 07/21/2024 11  0 - 40 mg/dL Final   LDL Cholesterol 07/21/2024 134 (H)  0 - 99 mg/dL Final   Comment:        Total Cholesterol/HDL:CHD Risk Coronary Heart Disease Risk Table                     Men   Women  1/2 Average Risk   3.4   3.3  Average Risk       5.0   4.4  2 X Average Risk   9.6   7.1  3 X Average Risk  23.4   11.0        Use the  calculated Patient Ratio above and the CHD Risk Table to determine the patient's CHD Risk.        ATP III CLASSIFICATION (LDL):  <100     mg/dL   Optimal  899-870  mg/dL   Near or Above                    Optimal  130-159  mg/dL   Borderline  839-810  mg/dL   High  >809     mg/dL   Very High Performed at [facility], [address] [place], [place] [address]    Color, Urine 07/21/2024 YELLOW  YELLOW Final   APPearance 07/21/2024 HAZY (A)  CLEAR Final   Specific Gravity, Urine 07/21/2024 1.018  1.005 - 1.030 Final   pH 07/21/2024 6.0  5.0 - 8.0 Final   Glucose, UA 07/21/2024 NEGATIVE  NEGATIVE mg/dL Final   Hgb urine dipstick 07/21/2024 NEGATIVE  NEGATIVE Final   Bilirubin Urine 07/21/2024 NEGATIVE  NEGATIVE Final   Ketones, ur 07/21/2024 NEGATIVE  NEGATIVE mg/dL Final   Protein, ur 91/92/7974 NEGATIVE  NEGATIVE mg/dL Final   Nitrite 91/92/7974 NEGATIVE  NEGATIVE Final   Leukocytes,Ua 07/21/2024 LARGE (A)  NEGATIVE Final   RBC / HPF 07/21/2024 0-5  0 - 5 RBC/hpf Final   WBC, UA 07/21/2024 >50  0 - 5 WBC/hpf Final   Bacteria, UA 07/21/2024 RARE (A)  NONE SEEN Final   Squamous Epithelial / HPF 07/21/2024 0-5  0 - 5 /HPF Final   Mucus 07/21/2024 PRESENT   Final   Performed at [facility], [address] [place], [place] [address]   Preg Test, Ur 07/21/2024 Negative  Negative Final   POC Amphetamine UR 07/21/2024 None Detected  NONE DETECTED (Cut Off Level 1000 ng/mL) Final   POC Secobarbital (BAR) 07/21/2024 None Detected  NONE DETECTED (Cut Off Level 300 ng/mL) Final   POC Buprenorphine (BUP) 07/21/2024 None Detected  NONE DETECTED (Cut Off Level 10 ng/mL) Final   POC Oxazepam (BZO) 07/21/2024 None Detected  NONE DETECTED (Cut Off Level 300 ng/mL) Final   POC Cocaine UR 07/21/2024 None Detected  NONE DETECTED (Cut Off Level 300 ng/mL) Final   POC Methamphetamine UR 07/21/2024 None Detected  NONE DETECTED (Cut Off Level 1000 ng/mL) Final   POC Morphine 07/21/2024 None  Detected  NONE DETECTED (Cut Off Level 300 ng/mL) Final   POC Methadone UR 07/21/2024 None Detected  NONE DETECTED (Cut Off Level 300 ng/mL) Final   POC Oxycodone UR 07/21/2024 None Detected  NONE DETECTED (Cut Off Level 100 ng/mL) Final   POC Marijuana UR 07/21/2024 None Detected  NONE DETECTED (Cut Off Level 50 ng/mL) Final   TSH 07/21/2024 2.263  0.350 - 4.500 uIU/mL Final   Comment: Performed by a 3rd Generation assay with a functional sensitivity of <=0.01 uIU/mL. Performed at [facility], [address] [place], [place] [address]    WBC 07/21/2024 5.6  4.0 - 10.5 K/uL Final   RBC 07/21/2024 4.61  3.87 - 5.11 MIL/uL Final   Hemoglobin 07/21/2024 13.1  12.0 - 15.0 g/dL Final   HCT 91/92/7974 40.8  36.0 - 46.0 % Final   MCV 07/21/2024 88.5  80.0 - 100.0 fL Final   MCH 07/21/2024 28.4  26.0 - 34.0 pg Final   MCHC 07/21/2024 32.1  30.0 - 36.0 g/dL Final   RDW 91/92/7974 14.7  11.5 - 15.5 % Final   Platelets 07/21/2024 309  150 - 400 K/uL Final   Comment: SPECIMEN CHECKED FOR CLOTS REPEATED TO VERIFY    nRBC 07/21/2024 0.0  0.0 - 0.2 % Final   Neutrophils Relative % 07/21/2024 60  % Final   Neutro Abs 07/21/2024 3.3  1.7 - 7.7 K/uL Final   Lymphocytes Relative 07/21/2024 30  % Final   Lymphs Abs 07/21/2024 1.7  0.7 - 4.0 K/uL Final   Monocytes Relative 07/21/2024 8  % Final   Monocytes Absolute 07/21/2024 0.4  0.1 - 1.0 K/uL Final   Eosinophils Relative 07/21/2024 2  % Final   Eosinophils Absolute 07/21/2024 0.1  0.0 - 0.5 K/uL Final   Basophils Relative 07/21/2024 0  % Final   Basophils Absolute 07/21/2024 0.0  0.0 - 0.1 K/uL Final   Immature Granulocytes 07/21/2024 0  % Final   Abs Immature Granulocytes 07/21/2024 0.01  0.00 - 0.07 K/uL Final   Abs Granulocyte 07/21/2024 3.3  1.5 - 6.5 K/uL Final   Performed at [facility], [address] [place], [place] [address]    Allergies: Dairycare [bacid], Egg-derived products, Methylprednisolone acetate, Banana,  Shellfish allergy, Pineapple, and Watermelon [citrullus vulgaris]  Medications:  Facility Ordered Medications  Medication   acetaminophen  (TYLENOL ) tablet 650 mg   alum & mag hydroxide-simeth (MAALOX/MYLANTA) 200-200-20 MG/5ML suspension 30 mL   magnesium  hydroxide (MILK OF MAGNESIA) suspension 30 mL   OLANZapine  zydis (ZYPREXA ) disintegrating tablet 5 mg   OLANZapine  (ZYPREXA ) injection 5 mg   OLANZapine  (ZYPREXA ) tablet 5 mg   [COMPLETED] LORazepam  (ATIVAN ) tablet 1 mg   EPINEPHrine  (EPI-PEN) injection 0.3 mg  hydrOXYzine  (ATARAX ) tablet 25 mg   PTA Medications  Medication Sig   albuterol  (VENTOLIN  HFA) 108 (90 Base) MCG/ACT inhaler Inhale into the lungs every 6 (six) hours as needed for wheezing or shortness of breath.   hydrOXYzine  (ATARAX ) 25 MG tablet Take 1 tablet (25 mg total) by mouth 3 (three) times daily as needed for anxiety.   risperiDONE  (RISPERDAL ) 1 MG tablet Take 1 tablet (1 mg total) by mouth at bedtime. For mood control   sertraline  (ZOLOFT ) 50 MG tablet Take 1 tablet (50 mg total) by mouth daily. For depression/anxiety   traZODone  (DESYREL ) 50 MG tablet Take 1 tablet (50 mg total) by mouth at bedtime as needed for sleep.      Medical Decision Making  Recommend inpatient psychiatric admission for stabilization and treatment.   Lab Orders         CBC with Differential/Platelet         Comprehensive metabolic panel         POC urine preg, ED         POCT Urine Drug Screen - (I-Screen)     EKG  Medications started - Ativan  1 mg p.o. once for catatonia, anxiety - Olanzapine  5 mg p.o. daily at bedtime for AMS/psychosis  Other PRNs - 0.3 mg daily as needed for anaphylaxis - Tylenol , Maalox, MOM, Atarax  - Agitation protocol medications  Recommendations  Based on my evaluation the patient does not appear to have an emergency medical condition.  Recommend inpatient psychiatric admission for stabilization and treatment.  Thurman LULLA Ivans, NP 08/17/24   1:26 AM

## 2024-08-17 ENCOUNTER — Other Ambulatory Visit: Payer: Self-pay

## 2024-08-17 ENCOUNTER — Inpatient Hospital Stay (HOSPITAL_COMMUNITY)
Admission: AD | Admit: 2024-08-17 | Discharge: 2024-08-20 | DRG: 885 | Disposition: A | Discharge status: Home / Self Care | Source: Intra-hospital

## 2024-08-17 ENCOUNTER — Encounter (HOSPITAL_COMMUNITY): Payer: Self-pay | Admitting: Nurse Practitioner

## 2024-08-17 DIAGNOSIS — Z91013 Allergy to seafood: Secondary | ICD-10-CM | POA: Diagnosis not present

## 2024-08-17 DIAGNOSIS — F332 Major depressive disorder, recurrent severe without psychotic features: Secondary | ICD-10-CM | POA: Diagnosis present

## 2024-08-17 DIAGNOSIS — Z888 Allergy status to other drugs, medicaments and biological substances status: Secondary | ICD-10-CM

## 2024-08-17 DIAGNOSIS — R4182 Altered mental status, unspecified: Secondary | ICD-10-CM | POA: Diagnosis present

## 2024-08-17 DIAGNOSIS — Z87892 Personal history of anaphylaxis: Secondary | ICD-10-CM | POA: Diagnosis not present

## 2024-08-17 DIAGNOSIS — L309 Dermatitis, unspecified: Secondary | ICD-10-CM | POA: Diagnosis present

## 2024-08-17 DIAGNOSIS — J45909 Unspecified asthma, uncomplicated: Secondary | ICD-10-CM | POA: Diagnosis present

## 2024-08-17 DIAGNOSIS — F203 Undifferentiated schizophrenia: Secondary | ICD-10-CM

## 2024-08-17 DIAGNOSIS — Z79899 Other long term (current) drug therapy: Secondary | ICD-10-CM | POA: Diagnosis not present

## 2024-08-17 DIAGNOSIS — Z91018 Allergy to other foods: Secondary | ICD-10-CM | POA: Diagnosis not present

## 2024-08-17 DIAGNOSIS — Z818 Family history of other mental and behavioral disorders: Secondary | ICD-10-CM

## 2024-08-17 DIAGNOSIS — Z91012 Allergy to eggs: Secondary | ICD-10-CM | POA: Diagnosis not present

## 2024-08-17 DIAGNOSIS — F061 Catatonic disorder due to known physiological condition: Secondary | ICD-10-CM | POA: Diagnosis present

## 2024-08-17 DIAGNOSIS — R030 Elevated blood-pressure reading, without diagnosis of hypertension: Secondary | ICD-10-CM | POA: Diagnosis not present

## 2024-08-17 DIAGNOSIS — F411 Generalized anxiety disorder: Secondary | ICD-10-CM | POA: Diagnosis present

## 2024-08-17 DIAGNOSIS — R4589 Other symptoms and signs involving emotional state: Secondary | ICD-10-CM | POA: Diagnosis not present

## 2024-08-17 LAB — CBC WITH DIFFERENTIAL/PLATELET
Abs Immature Granulocytes: 0.02 K/uL (ref 0.00–0.07)
Basophils Absolute: 0 K/uL (ref 0.0–0.1)
Basophils Relative: 0 %
Eosinophils Absolute: 0.1 K/uL (ref 0.0–0.5)
Eosinophils Relative: 1 %
HCT: 41.5 % (ref 36.0–46.0)
Hemoglobin: 12.7 g/dL (ref 12.0–15.0)
Immature Granulocytes: 0 %
Lymphocytes Relative: 12 %
Lymphs Abs: 0.7 K/uL (ref 0.7–4.0)
MCH: 27.7 pg (ref 26.0–34.0)
MCHC: 30.6 g/dL (ref 30.0–36.0)
MCV: 90.4 fL (ref 80.0–100.0)
Monocytes Absolute: 0.6 K/uL (ref 0.1–1.0)
Monocytes Relative: 9 %
Neutro Abs: 4.8 K/uL (ref 1.7–7.7)
Neutrophils Relative %: 78 %
Platelets: 374 K/uL (ref 150–400)
RBC: 4.59 MIL/uL (ref 3.87–5.11)
RDW: 15.6 % — ABNORMAL HIGH (ref 11.5–15.5)
WBC: 6.2 K/uL (ref 4.0–10.5)
nRBC: 0 % (ref 0.0–0.2)

## 2024-08-17 LAB — POCT URINE DRUG SCREEN - MANUAL ENTRY (I-SCREEN)
POC Amphetamine UR: NOT DETECTED
POC Buprenorphine (BUP): NOT DETECTED
POC Cocaine UR: NOT DETECTED
POC Marijuana UR: NOT DETECTED
POC Methadone UR: NOT DETECTED
POC Methamphetamine UR: NOT DETECTED
POC Morphine: NOT DETECTED
POC Oxazepam (BZO): NOT DETECTED
POC Oxycodone UR: NOT DETECTED
POC Secobarbital (BAR): NOT DETECTED

## 2024-08-17 LAB — COMPREHENSIVE METABOLIC PANEL WITH GFR
ALT: 18 U/L (ref 0–44)
AST: 21 U/L (ref 15–41)
Albumin: 4.8 g/dL (ref 3.5–5.0)
Alkaline Phosphatase: 53 U/L (ref 38–126)
Anion gap: 16 — ABNORMAL HIGH (ref 5–15)
BUN: 17 mg/dL (ref 6–20)
CO2: 23 mmol/L (ref 22–32)
Calcium: 10.2 mg/dL (ref 8.9–10.3)
Chloride: 98 mmol/L (ref 98–111)
Creatinine, Ser: 0.79 mg/dL (ref 0.44–1.00)
GFR, Estimated: >60 mL/min (ref >60)
Glucose, Bld: 73 mg/dL (ref 70–99)
Potassium: 4.1 mmol/L (ref 3.5–5.1)
Sodium: 137 mmol/L (ref 135–145)
Total Bilirubin: 0.8 mg/dL (ref 0.0–1.2)
Total Protein: 8 g/dL (ref 6.5–8.1)

## 2024-08-17 LAB — POC URINE PREG, ED: Preg Test, Ur: NEGATIVE

## 2024-08-17 MED ORDER — AQUAPHOR EX OINT
TOPICAL_OINTMENT | Freq: Two times a day (BID) | CUTANEOUS | Status: DC | PRN
  Filled 2024-08-17: qty 50

## 2024-08-17 MED ORDER — OLANZAPINE 10 MG IM SOLR: 5.0000 mg | Freq: Three times a day (TID) | INTRAMUSCULAR | Status: DC | PRN

## 2024-08-17 MED ORDER — HYDROXYZINE HCL 25 MG PO TABS: 25.0000 mg | ORAL_TABLET | Freq: Three times a day (TID) | ORAL | Status: DC | PRN

## 2024-08-17 MED ORDER — OLANZAPINE 5 MG PO TBDP: 5.0000 mg | ORAL_TABLET | Freq: Three times a day (TID) | ORAL | Status: DC | PRN

## 2024-08-17 MED ORDER — OLANZAPINE 5 MG PO TABS
5.0000 mg | ORAL_TABLET | Freq: Every day | ORAL | Status: DC
  Administered 2024-08-17 – 2024-08-19 (×3): 5 mg via ORAL
  Filled 2024-08-17 (×3): qty 1

## 2024-08-17 MED ORDER — CETIRIZINE HCL 10 MG PO TABS
10.0000 mg | ORAL_TABLET | Freq: Every day | ORAL | Status: DC
  Administered 2024-08-17 – 2024-08-19 (×3): 10 mg via ORAL
  Filled 2024-08-17 (×3): qty 1

## 2024-08-17 MED ORDER — EPINEPHRINE 0.3 MG/0.3ML IJ SOAJ: 0.3000 mg | Freq: Every day | INTRAMUSCULAR | Status: DC | PRN

## 2024-08-17 MED ORDER — WHITE PETROLATUM EX OINT
TOPICAL_OINTMENT | CUTANEOUS | Status: AC
  Filled 2024-08-17: qty 5

## 2024-08-17 MED ORDER — HYDROXYZINE HCL 25 MG PO TABS
25.0000 mg | ORAL_TABLET | Freq: Three times a day (TID) | ORAL | Status: DC | PRN
  Administered 2024-08-17 – 2024-08-19 (×3): 25 mg via ORAL
  Filled 2024-08-17 (×3): qty 1

## 2024-08-17 MED ORDER — HYDROCORTISONE 1 % EX CREA
TOPICAL_CREAM | Freq: Every day | CUTANEOUS | Status: DC | PRN
  Filled 2024-08-17: qty 28

## 2024-08-17 NOTE — Tx Team (Signed)
 Initial Treatment Plan 08/17/2024 7:04 PM [name] [name] FMW:[id number]    PATIENT STRESSORS: Health problems   Medication change or noncompliance     PATIENT STRENGTHS: Ability for insight  Supportive family/friends    PATIENT IDENTIFIED PROBLEMS: Anxiety  Insomnia  Med non-compliance                 DISCHARGE CRITERIA:  Improved stabilization in mood, thinking, and/or behavior Verbal commitment to aftercare and medication compliance  PRELIMINARY DISCHARGE PLAN: Return to previous living arrangement  PATIENT/FAMILY INVOLVEMENT: This treatment plan has been presented to and reviewed with the patient, Brandy Hogan. The patient has been given the opportunity to ask questions and make suggestions.  Ayerim Berquist, RN 08/17/2024, 7:04 PM

## 2024-08-17 NOTE — ED Provider Notes (Signed)
 FBC/OBS ASAP Discharge Summary  Date and Time: 08/17/2024 8:19 AM  Name: Brandy Hogan  MRN:  [id number]   Discharge Diagnoses:  Final diagnoses:  Undifferentiated schizophrenia (HCC)  Anxious appearance  Catatonia  Altered mental status, unspecified altered mental status type    Subjective: Brandy Hogan is a 58 year old female with psychiatric history of MDD, GAD, OCD-unspecified type, psychosis and undifferentiated schizophrenia, presented voluntarily as a walk-in to [facility] accompanied by her husband, due to increased worry and anxiety and seeking psychiatric stabilization.  Patient was seen face to face by this provider and chart reviewed.  Per chart review, patient was recently admitted at the [facility] between 07/22/24-07/28/24 for worsening insomnia/anxiety & intrusive thoughts of an impending doom on her family.   Patient this morning endorsed depression, anxiety, paranoia (someone is working against her, but she would not say who). Patient has been medication non-compliant since her last inpatient stay. She cites dizziness from sertraline , an odd story about intermittent eye swelling from risperidone . Patient was improved relative to yesterday's reports of thought blocking, bizarre behavior.   Patient this morning said she had slept well and was appreciative of olanzapine  and (possibly even more so) ativan  she received at 0044.    Patient was recommended for inpatient psychiatry. Question of whether schizophrenia  Stay Summary: Pt presented with her husband Brandy Hogan on 9/2 with disorganization and thought blocking.   Total Time spent with patient: 30 minutes  Past Psychiatric History:  [facility] admission, 2024. Reports Previous Psychiatric Hospitalization at [facility] 10/2022) & [facility]. Patient denies any hx of suicide attempts or self-harming behaviors.   Past Medical History: asthma, vitamin d  deficiency, eczema Family History: as below Family Psychiatric History:  Mother- Schizophrenia Sister- Bipolar Disorder Maternal Grandfather- EtOH Abuse No Known Suicides   Social History: lives at home with husband Brandy Hogan. Tobacco Cessation:  N/A, patient does not currently use tobacco products  Current Medications:  Current Facility-Administered Medications  Medication Dose Route Frequency Provider Last Rate Last Admin   acetaminophen  (TYLENOL ) tablet 650 mg  650 mg Oral Q6H PRN Onuoha, Chinwendu V, NP       alum & mag hydroxide-simeth (MAALOX/MYLANTA) 200-200-20 MG/5ML suspension 30 mL  30 mL Oral Q4H PRN Onuoha, Chinwendu V, NP       EPINEPHrine  (EPI-PEN) injection 0.3 mg  0.3 mg Intramuscular Daily PRN Onuoha, Chinwendu V, NP       hydrOXYzine  (ATARAX ) tablet 25 mg  25 mg Oral TID PRN Onuoha, Chinwendu V, NP       magnesium  hydroxide (MILK OF MAGNESIA) suspension 30 mL  30 mL Oral Daily PRN Onuoha, Chinwendu V, NP       OLANZapine  (ZYPREXA ) injection 5 mg  5 mg Intramuscular TID PRN Onuoha, Chinwendu V, NP       OLANZapine  (ZYPREXA ) tablet 5 mg  5 mg Oral QHS Onuoha, Chinwendu V, NP   5 mg at 08/17/24 0044   OLANZapine  zydis (ZYPREXA ) disintegrating tablet 5 mg  5 mg Oral TID PRN Onuoha, Chinwendu V, NP       Current Outpatient Medications  Medication Sig Dispense Refill   albuterol  (VENTOLIN  HFA) 108 (90 Base) MCG/ACT inhaler Inhale into the lungs every 6 (six) hours as needed for wheezing or shortness of breath.     hydrOXYzine  (ATARAX ) 25 MG tablet Take 1 tablet (25 mg total) by mouth 3 (three) times daily as needed for anxiety. 75 tablet 0   risperiDONE  (RISPERDAL ) 1 MG tablet Take 1 tablet (  1 mg total) by mouth at bedtime. For mood control 30 tablet 0   sertraline  (ZOLOFT ) 50 MG tablet Take 1 tablet (50 mg total) by mouth daily. For depression/anxiety 30 tablet 0   traZODone  (DESYREL ) 50 MG tablet Take 1 tablet (50 mg total) by mouth at bedtime as needed for sleep. 30 tablet 0    PTA Medications:  Facility Ordered Medications  Medication    acetaminophen  (TYLENOL ) tablet 650 mg   alum & mag hydroxide-simeth (MAALOX/MYLANTA) 200-200-20 MG/5ML suspension 30 mL   magnesium  hydroxide (MILK OF MAGNESIA) suspension 30 mL   OLANZapine  (ZYPREXA ) tablet 5 mg   [COMPLETED] LORazepam  (ATIVAN ) tablet 1 mg   EPINEPHrine  (EPI-PEN) injection 0.3 mg   hydrOXYzine  (ATARAX ) tablet 25 mg   OLANZapine  zydis (ZYPREXA ) disintegrating tablet 5 mg   OLANZapine  (ZYPREXA ) injection 5 mg   PTA Medications  Medication Sig   albuterol  (VENTOLIN  HFA) 108 (90 Base) MCG/ACT inhaler Inhale into the lungs every 6 (six) hours as needed for wheezing or shortness of breath.   hydrOXYzine  (ATARAX ) 25 MG tablet Take 1 tablet (25 mg total) by mouth 3 (three) times daily as needed for anxiety.   risperiDONE  (RISPERDAL ) 1 MG tablet Take 1 tablet (1 mg total) by mouth at bedtime. For mood control   sertraline  (ZOLOFT ) 50 MG tablet Take 1 tablet (50 mg total) by mouth daily. For depression/anxiety   traZODone  (DESYREL ) 50 MG tablet Take 1 tablet (50 mg total) by mouth at bedtime as needed for sleep.       11/07/2023   12:12 PM  Depression screen PHQ 2/9  Decreased Interest 3  Down, Depressed, Hopeless 2  PHQ - 2 Score 5  Altered sleeping 2  Tired, decreased energy 2  Change in appetite 3  Feeling bad or failure about yourself  3  Trouble concentrating 3  Moving slowly or fidgety/restless 1  Suicidal thoughts 1  PHQ-9 Score 20  Difficult doing work/chores Somewhat difficult    Flowsheet Row ED from 08/16/2024 in [facility] from 07[facility]21/2024 in [facility]  C-SSRS RISK CATEGORY Error: Q3, 4, or 5 should not be populated when Q2 is No Low Risk Error: Q3, 4, or 5 should not be populated when Q2 is No    Musculoskeletal  Strength & Muscle Tone: within normal limits Gait & Station: normal Patient leans:  N/A  Psychiatric Specialty Exam  Presentation  General Appearance:  Fairly Groomed  Eye Contact: None  Speech: Blocked  Speech Volume: Decreased  Handedness: Right   Mood and Affect  Mood: Anxious  Affect: Congruent   Thought Process  Thought Processes: Other (comment) (UTA)  Descriptions of Associations:Loose (UTA)  Orientation:Other (comment) (UTA)  Thought Content:Other (comment) (UTA)  Diagnosis of Schizophrenia or Schizoaffective disorder in past: Yes  Duration of Psychotic Symptoms: Less than six months   Hallucinations:Hallucinations: Other (comment) (UTA)  Ideas of Reference:Other (comment) (UTA)  Suicidal Thoughts:Suicidal Thoughts: No (UTA)  Homicidal Thoughts:Homicidal Thoughts: No (UTA)   Sensorium  Memory: Immediate Poor  Judgment: Impaired  Insight: Other (comment) (UTA)   Executive Functions  Concentration: Poor  Attention Span: Poor  Recall: Poor  Fund of Knowledge: Poor  Language: Poor   Psychomotor Activity  Psychomotor Activity: Psychomotor Activity: Mannerisms   Assets  Assets: Desire for Improvement   Sleep  Sleep: Sleep: Poor  Estimated Sleeping Duration (Last 24 Hours): 4.75-5.25 hours  Nutritional  Assessment (For OBS and FBC admissions only) Has the patient had a weight loss or gain of 10 pounds or more in the last 3 months?: No Has the patient had a decrease in food intake/or appetite?: No Does the patient have dental problems?: No Does the patient have eating habits or behaviors that may be indicators of an eating disorder including binging or inducing vomiting?: No Has the patient recently lost weight without trying?: 0 Has the patient been eating poorly because of a decreased appetite?: 0 Malnutrition Screening Tool Score: 0    Physical Exam  Physical Exam ROS Blood pressure (!) 142/90, pulse (!) 106, temperature 98.6 F (37 C), temperature source Oral, resp. rate 18, last  menstrual period 09/08/2012, SpO2 100%. There is no height or weight on file to calculate BMI.  Demographic Factors:  Unemployed  Loss Factors: NA  Historical Factors: Family history of mental illness or substance abuse and Impulsivity  Risk Reduction Factors:   Sense of responsibility to family, Living with another person, especially a relative, and Positive social support  Continued Clinical Symptoms:  More than one psychiatric diagnosis Currently Psychotic  Cognitive Features That Contribute To Risk:  Loss of executive function    Suicide Risk:  Mild:  Suicidal ideation of limited frequency, intensity, duration, and specificity.  There are no identifiable plans, no associated intent, mild dysphoria and related symptoms, good self-control (both objective and subjective assessment), few other risk factors, and identifiable protective factors, including available and accessible social support.  Plan Of Care/Follow-up recommendations:    Discharge Instructions      [facility]    Disposition: Sent to [facility]. Recommend increasing Olanzapine . Patient dramatically improved relative to notes from provider yesterday after 1 mg Ativan  and 5 mg olanzapine . R/O catatonia.   Lynwood Morene Lavone Delsie, MD 08/17/2024, 8:19 AM

## 2024-08-17 NOTE — ED Notes (Signed)
 Pt has been calm and cooperative.   Pt is aware of her transfer to [facility] is agreeable to plan Denied current SI plan and intent,  Denied HI and A/V hallucinations

## 2024-08-17 NOTE — BH Assessment (Addendum)
 Comprehensive Clinical Assessment (CCA) Note  08/17/2024 Brandy Hogan [id number] Disposition: Pt was brought to [facility] voluntarily by her husband.  Patient was triaged by and had the CCA completed by this clinician.  Patient was seen by Richerd Ivans, NP for her MSE.  Patient recommended for inpatient care by [place].  Patient has a flat affect and does not speak intelligibly.  She appears to be thought blocking.  Patient does not speak a complete sentence during her assessment.  Eye contact is poor, looking off in the distance.  Patient sleep and appetite have been poor the past 24 hours per husband.    Patient had an appointment with Brandy Hogan for 09/03 per discharge summary from [facility].     Chief Complaint:  Chief Complaint  Patient presents with   Anxiety   Visit Diagnosis: MDD, recurrent, severe; OCD; Generalized Anxiety D/O    CCA Screening, Triage and Referral (STR)  Patient Reported Information How did you hear about us ? Family/Friend  What Is the Reason for Your Visit/Call Today? Pt was brought to [facility] by her husband, Brandy Hogan.  Pt has a history of undifferentiated schizophrenia, MDD and intrusive thoughts.  Currently she is denying SI or HI.  Patient is unclear about whether she is seeing or hearing things.  Pt denies any previous suicide attempt.  Pt has been having a lot of worry and anxiety.  Pt denies any use of ETOH or other substances.  Patient denie saccess to guns.  Patient does not look at clinician but looks to the side and makes noises.  Whispers yes or no (which isn't always clear) for answers.  Patient does say she has had some sleep.  It is unclear whether she has been eating well..  Patient was at [facility] August 8-14, '25.  Pt husband said that patient has been inconsistent with her medications upon discharge.  Pt is followed by [facility] for her medication managment and has an appoiintment on 09/03.  Patient has not had anyting to eat today, no appetite  today.  Husband said that her sleep has been okay.  Husband said that there are no guns in the home.  How Long Has This Been Causing You Problems? 1 wk - 1 month  What Do You Feel Would Help You the Most Today? Treatment for Depression or other mood problem   Have You Recently Had Any Thoughts About Hurting Yourself? No  Are You Planning to Commit Suicide/Harm Yourself At This time? No   Flowsheet Row ED from 08/16/2024 in [facility] Admission (Discharged) from 07/22/2024 in [facility] INPATIENT ADULT [facility] ED from 07/21/2024 in [facility]  C-SSRS RISK CATEGORY No Risk Low Risk Error: Q3, 4, or 5 should not be populated when Q2 is No    Have you Recently Had Thoughts About Hurting Someone [name]? No  Are You Planning to Harm Someone at This Time? No  Explanation: No current SI or HI.   Have You Used Any Alcohol or Drugs in the Past 24 Hours? No  How Long Ago Did You Use Drugs or Alcohol? No data recorded What Did You Use and How Much? No data recorded  Do You Currently Have a Therapist/Psychiatrist? Yes  Name of Therapist/Psychiatrist: Name of Therapist/Psychiatrist: Izzy Hogan for med management.   Have You Been Recently Discharged From Any Office Practice or Programs? Yes  Explanation of Discharge From Practice/Program: Was at [facility] 08/08 to 08/14, 2025.  CCA Screening Triage Referral Assessment Type of Contact: Face-to-Face  Telemedicine Service Delivery:   Is this Initial or Reassessment?   Date Telepsych consult ordered in [facility]:    Time Telepsych consult ordered in [facility]:    Location of Assessment: [facility] Assessment Services  Provider Location: [facility] Assessment Services   Collateral Involvement: Husband, Brandy Hogan, was present and participated in the assessment.   Does Patient Have a Automotive engineer Guardian? No  Legal Guardian Contact Information: Pt does not have a guardian.  Copy of  Legal Guardianship Form: -- (Pt does not have a guardian.)  Legal Guardian Notified of Arrival: -- (Pt does not have a guardian.)  Legal Guardian Notified of Pending Discharge: -- (Pt does not have a guardian.)  If Minor and Not Living with Parent(s), Who has Custody? Pt is an adult.  Is CPS involved or ever been involved? Never  Is APS involved or ever been involved? Never   Patient Determined To Be At Risk for Harm To Self or Others Based on Review of Patient Reported Information or Presenting Complaint? No  Method: No Plan  Availability of Means: No access or NA  Intent: Vague intent or NA  Notification Required: No need or identified person  Additional Information for Danger to Others Potential: Active psychosis  Additional Comments for Danger to Others Potential: Denies HI.  Are There Guns or Other Weapons in Your Home? No  Types of Guns/Weapons: None  Are These Weapons Safely Secured?                            No  Who Could Verify You Are Able To Have These Secured: Husband  Do You Have any Outstanding Charges, Pending Court Dates, Parole/Probation? Denies  Contacted To Inform of Risk of Harm To Self or Others: Other: Comment (N/A)    Does Patient Present under Involuntary Commitment? No    [place] of Residence: Brandy Hogan   Patient Currently Receiving the Following Services: Medication Management   Determination of Need: Urgent (48 hours)   Options For Referral: [facility]; Inpatient Hospitalization; Medication Management     CCA Biopsychosocial Patient Reported Schizophrenia/Schizoaffective Diagnosis in Past: Yes   Strengths: Never giving up; good at patterns and figuring things out.  Will keep at a problem until I get it figured out. (from previous assessment)   Mental Hogan Symptoms Depression:  Fatigue; Increase/decrease in appetite; Sleep (too much or little); Tearfulness; Difficulty Concentrating   Duration of Depressive symptoms:  Duration of Depressive Symptoms: Greater than two weeks   Mania:  None   Anxiety:   Difficulty concentrating; Worrying; Tension   Psychosis:  Grossly disorganized or catatonic behavior; Grossly disorganized speech   Duration of Psychotic symptoms: Duration of Psychotic Symptoms: Less than six months   Trauma:  Difficulty staying/falling asleep   Obsessions:  Intrusive/time consuming; Disrupts routine/functioning   Compulsions:  Driven to perform behaviors/acts   Inattention:  N/A   Hyperactivity/Impulsivity:  N/A   Oppositional/Defiant Behaviors:  N/A   Emotional Irregularity:  Potentially harmful impulsivity   Other Mood/Personality Symptoms:  having thought blocking symptoms.    Mental Status Exam Appearance and self-care  Stature:  Small   Weight:  Average weight   Clothing:  Casual   Grooming:  Normal   Cosmetic use:  None   Posture/gait:  Normal   Motor activity:  Slowed   Sensorium  Attention:  Unaware   Concentration:  Preoccupied;  Variable   Orientation:  -- (UTA)   Recall/memory:  -- (UTA)   Affect and Mood  Affect:  Flat; Depressed   Mood:  Depressed   Relating  Eye contact:  Avoided   Facial expression:  Sad; Constricted   Attitude toward examiner:  Dependent (Husband has to provide information.)   Thought and Language  Speech flow: Blocked   Thought content:  Appropriate to Mood and Circumstances   Preoccupation:  Other (Comment) (Unknown)   Hallucinations:  None   Organization:  Disorganized   Company secretary of Knowledge:  Average   Intelligence:  Average   Abstraction:  Normal   Judgement:  Impaired (Due to mental status)   Reality Testing:  Unaware   Insight:  Lacking; Unaware   Decision Making:  Confused   Social Functioning  Social Maturity:  Impulsive; Isolates   Social Judgement:  -- (UTA)   Stress  Stressors:  Relationship; Family conflict   Coping Ability:  Overwhelmed   Skill  Deficits:  Decision making; Communication; Self-care   Supports:  Family; Friends/Service system     Religion: Religion/Spirituality Are You A Religious Person?: Yes What is Your Religious Affiliation?: Christian How Might This Affect Treatment?: No affect on treatment.  Leisure/Recreation: Leisure / Recreation Do You Have Hobbies?: Yes Leisure and Hobbies: Tree surgeon: Exercise/Diet Do You Exercise?: No Have You Gained or Lost A Significant Amount of Weight in the Past Six Months?: No Do You Follow a Special Diet?: Yes Type of Diet: Avoid dairy products, milk, cheese, egg products. Do You Have Any Trouble Sleeping?: Yes Explanation of Sleeping Difficulties: Husband said no sleep today.   CCA Employment/Education Employment/Work Situation: Employment / Work Systems developer: Retired Passenger transport manager has Been Impacted by Current Illness: No Has Patient ever Been in Equities trader?: No  Education: Education Is Patient Currently Attending School?: No Last Grade Completed: 16 Did You Product manager?: Yes What Type of College Degree Do you Have?: BS- Business Managment Did You Have An Individualized Education Program (IIEP): No Did You Have Any Difficulty At School?: No Patient's Education Has Been Impacted by Current Illness: No   CCA Family/Childhood History Family and Relationship History: Family history Marital status: Married Number of Years Married:  (UTA) What types of issues is patient dealing with in the relationship?: UTA Additional relationship information: UTA Does patient have children?: Yes How many children?: 3 How is patient's relationship with their children?: UTA  Childhood History:  Childhood History By whom was/is the patient raised?: Mother Description of patient's current relationship with siblings: Pt reports her relationship is okay with her sister (per chart review) Did patient suffer any  verbal/emotional/physical/sexual abuse as a child?:  (UTA) Did patient suffer from severe childhood neglect?:  (UTA) Has patient ever been sexually abused/assaulted/raped as an adolescent or adult?:  (UTA) Was the patient ever a victim of a crime or a disaster?: No Witnessed domestic violence?: No Has patient been affected by domestic violence as an adult?: No       CCA Substance Use Alcohol/Drug Use: Alcohol / Drug Use Pain Medications: See d/c summary from [facility] on 07/28/24 Prescriptions: See d/c summary from [facility] on 07/28/24 Over the Counter: See d/c summary from [facility] on 07/28/24 History of alcohol / drug use?: No history of alcohol / drug abuse Longest period of sobriety (when/how long): n/a Withdrawal Symptoms: None  ASAM's:  Six Dimensions of Multidimensional Assessment  Dimension 1:  Acute Intoxication and/or Withdrawal Potential:      Dimension 2:  Biomedical Conditions and Complications:      Dimension 3:  Emotional, Behavioral, or Cognitive Conditions and Complications:     Dimension 4:  Readiness to Change:     Dimension 5:  Relapse, Continued use, or Continued Problem Potential:     Dimension 6:  Recovery/Living Environment:     ASAM Severity Score:    ASAM Recommended Level of Treatment:     Substance use Disorder (SUD)    Recommendations for Services/Supports/Treatments: Recommendations for Services/Supports/Treatments Recommendations For Services/Supports/Treatments: Inpatient Hospitalization  Disposition Recommendation per psychiatric provider: We recommend inpatient psychiatric hospitalization when medically cleared. Patient is under voluntary admission status at this time; please IVC if attempts to leave hospital.   DSM5 Diagnoses: Patient Active Problem List   Diagnosis Date Noted   Obsessive-compulsive disorder, unspecified type 07/22/2024   Major depressive disorder, recurrent episode, severe (HCC) 07/22/2024    Generalized anxiety disorder 07/22/2024   MDD (major depressive disorder), severe (HCC) 11/07/2023   Anxiety 11/07/2023   Undifferentiated schizophrenia (HCC) 12/17/2022     Referrals to Alternative Service(s): Referred to Alternative Service(s):   Place:   Date:   Time:    Referred to Alternative Service(s):   Place:   Date:   Time:    Referred to Alternative Service(s):   Place:   Date:   Time:    Referred to Alternative Service(s):   Place:   Date:   Time:     Mitchell Jerona Levander HENRI

## 2024-08-17 NOTE — ED Notes (Signed)
 Patient resting quietly in bed with eyes closed, Respirations equal and unlabored, skin warm and dry, NAD. Routine safety checks conducted according to facility protocol. Will continue to monitor for safety.

## 2024-08-17 NOTE — Progress Notes (Signed)
 Brandy Hogan is a 58 year old female admitted voluntarily from [facility] due to worsening anxiety and insomnia.   Per [facility] report: patient presented catatonic mumbling to herself as if responding to internal stimuli and thought blocking.   Currently: Patient presents with animated affect stating she had not slept for 3-4 days and she finally got good sleep last night. Patient requests to take the same medication as last night as she claims it helped her. Patient also states she had stopped taking her medication at home due to one medication causing her eye swelling. Patient claims she feels much better now and denies SI,HI, and A/V/H with no plan or intent. Patient was recently at [facility] between 07/22/24-07/28/24 and states I dont plan on staying long here at all.   Patient oriented to unit/unit rules and provided with meal. Patient cooperative at this time with no s/s of current distress.   BP (!) 117/97 (BP Location: Left Arm)   Pulse 93   Temp 98 F (36.7 C) (Oral)   Resp 18   Ht 4' 11 (1.499 m)   Wt 54.7 kg   LMP 09/08/2012   SpO2 100%   BMI 24.36 kg/m

## 2024-08-17 NOTE — Group Note (Signed)
 Date:  08/17/2024 Time:  10:37 PM  Group Topic/Focus:  Wrap-Up Group:   The focus of this group is to help patients review their daily goal of treatment and discuss progress on daily workbooks.    Participation Level:  Active  Participation Quality:  Appropriate and Sharing  Affect:  Appropriate  Cognitive:  Appropriate  Insight: Appropriate and Limited  Engagement in Group:  Engaged and Off Topic  Modes of Intervention:  Activity and Socialization  Additional Comments:  Patient completed wrap up group sheet. Patient rated her day a 7 out of 10. Patient goal for today, a goodnight sleep. Coping skills the patient finds most helpful, wrapping up in a blanket. Something the patient likes about herself, I never give up.   Brandy Hogan 08/17/2024, 10:37 PM

## 2024-08-17 NOTE — Progress Notes (Signed)
 Pt has been accepted to [facility] on 08/17/2024 Bed assignment: 404-02  Pt meets inpatient criteria per: Thurman Ivans NP  Attending Physician will be: Dr. Prentis    Report can be called un:lwpu: Adult unit: [phone]  Pt can arrive after [facility] will update   Care Team Notified: [facility] Cherylynn Ernst RN, Annalee NoGo RN, Morene Bash MD  [place] Nai Dasch LCSW-A   08/17/2024 8:47 AM

## 2024-08-17 NOTE — ED Notes (Signed)
 Stable. Transferring to [facility]   via  safe transport  all belongings and valuable sent with with patient and. Safe trasnport  Denied current SI plan and intent.  Denied HI and A/V hallucinations

## 2024-08-17 NOTE — ED Notes (Signed)
 Pt presents at nurses station.  Calm and cooperative.  Pt reports feeling better this am because she was finally able to sleep. Q 15 minute observations for safety continue

## 2024-08-17 NOTE — Discharge Instructions (Addendum)
 [facility]

## 2024-08-18 DIAGNOSIS — F203 Undifferentiated schizophrenia: Secondary | ICD-10-CM | POA: Diagnosis not present

## 2024-08-18 MED ORDER — TRAZODONE HCL 50 MG PO TABS: 50.0000 mg | ORAL_TABLET | Freq: Every evening | ORAL | Status: DC | PRN

## 2024-08-18 MED ORDER — FLUOXETINE HCL 10 MG PO CAPS
10.0000 mg | ORAL_CAPSULE | Freq: Every day | ORAL | Status: DC
  Administered 2024-08-18 – 2024-08-20 (×3): 10 mg via ORAL
  Filled 2024-08-18 (×3): qty 1

## 2024-08-18 MED ORDER — VITAMIN D 25 MCG (1000 UNIT) PO TABS
2000.0000 [IU] | ORAL_TABLET | Freq: Every day | ORAL | Status: DC
  Administered 2024-08-18 – 2024-08-20 (×3): 2000 [IU] via ORAL
  Filled 2024-08-18 (×3): qty 2

## 2024-08-18 MED ORDER — ALBUTEROL SULFATE HFA 108 (90 BASE) MCG/ACT IN AERS: 1.0000 | INHALATION_SPRAY | Freq: Four times a day (QID) | RESPIRATORY_TRACT | Status: DC | PRN

## 2024-08-18 NOTE — Progress Notes (Signed)
(  Sleep Hours) - 7.25 (Any PRNs that were needed, meds refused, or side effects to meds)- PRN Hydroxyzine  (Any disturbances and when (visitation, over night)- None (Concerns raised by the patient)- None (SI/HI/AVH)- Denies, contracts for safety

## 2024-08-18 NOTE — Progress Notes (Signed)
   08/18/24 2100  Psych Admission Type (Psych Patients Only)  Admission Status Voluntary/72 hour document signed  Psychosocial Assessment  Patient Complaints None  Eye Contact Fair  Facial Expression Flat  Affect Appropriate to circumstance  Speech Logical/coherent  Interaction Assertive  Motor Activity Slow  Appearance/Hygiene Unremarkable  Behavior Characteristics Cooperative;Calm  Mood Pleasant  Thought Process  Coherency WDL  Content WDL  Delusions None reported or observed  Perception WDL  Hallucination None reported or observed  Judgment Poor  Confusion None  Danger to Self  Current suicidal ideation? Denies  Agreement Not to Harm Self Yes  Description of Agreement Verbal  Danger to Others  Danger to Others None reported or observed

## 2024-08-18 NOTE — Group Note (Signed)
 LCSW Group Therapy Note   Group Date: 08/18/2024 Start Time: 1100 End Time: 1200   Participation:  patient was present and actively participated in the discussion.  Type of Therapy:  Group Therapy  Topic:  Healing Hearts:  A Safe Space for Grief  Objective:  The objective of this class, Healing Hearts: A Safe Space for Grief, is to create a compassionate environment where participants can process their grief, explore different stages of grief, and discover ways to honor their loved ones through personal rituals.  3 Goals: Provide a safe and supportive space where participants feel comfortable sharing their feelings and experiences of grief without judgment. Educate participants about the stages of grief and emphasize that there is no right way to grieve or a fixed timeline for healing. Introduce the concept of rituals as a means to process grief, allowing individuals to honor their loved ones in a personal and meaningful way.  Summary:  In Healing Hearts: A Safe Space for Grief, we explored the unique and personal journey of grief, emphasizing that everyone experiences it differently. We discussed the five stages of grief (denial, anger, bargaining, depression, and acceptance), with the understanding that grief is not linear. Rituals were introduced as a way to help cope with loss, offering comfort and connection through meaningful actions such as lighting candles or taking memory walks. Participants were encouraged to express their emotions, focus on self-care, and reflect on moments of gratitude for their loved ones, recognizing that healing is a process and there is no timeline for grief.  Therapeutic Modalities: Psychoeducation:  5 Stages of Grief Elements of DBT:  mindfulness and grounding for distress tolerance   Marybelle Giraldo O Dewanna Hurston, LCSWA 08/18/2024  1:07 PM

## 2024-08-18 NOTE — [facility] Group Notes (Signed)
 Adult Psychoeducational Group Note  Date:  08/18/2024 Time:  9:57 AM  Group Topic/Focus:  Goals Group:   The focus of this group is to help patients establish daily goals to achieve during treatment and discuss how the patient can incorporate goal setting into their daily lives to aide in recovery.  Orientation:   The focus of this group is to educate the patient on the purpose and policies of crisis stabilization and provide a format to answer questions about their admission.  The group details unit policies and expectations of patients while admitted.  Participation Level:  Active  Participation Quality:  Appropriate  Affect:  Appropriate  Cognitive:  Appropriate  Insight: Appropriate  Engagement in Group:  Engaged  Modes of Intervention:  Orientation  Additional Comments:  Pt goal for today is to work on discharge plan   Niel CHRISTELLA Nightingale 08/18/2024, 9:57 AM

## 2024-08-18 NOTE — Plan of Care (Addendum)
 SABRA

## 2024-08-18 NOTE — Progress Notes (Signed)
 Patient signed a 72 hr request for discharge today at 20

## 2024-08-18 NOTE — [facility] Group Notes (Signed)
 Adult Psychoeducational Group Note  Date:  08/18/2024 Time:  9:14 PM  Group Topic/Focus:  Wrap-Up Group:   The focus of this group is to help patients review their daily goal of treatment and discuss progress on daily workbooks.  Participation Level:  Active  Participation Quality:  Appropriate  Affect:  Appropriate  Cognitive:  Alert  Insight: Appropriate  Engagement in Group:  Engaged  Modes of Intervention:  Discussion  Additional Comments:  Patient attended and participated in the Wrap-up group.  Brandy Hogan 08/18/2024, 9:14 PM

## 2024-08-18 NOTE — Plan of Care (Signed)
   Problem: Safety: Goal: Periods of time without injury will increase Outcome: Progressing
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
## 2024-08-18 NOTE — Progress Notes (Addendum)
 D. Pt presents with depressed affect, has been calm and cooperative, soft spoken, and responds slowing during interactions- pt reported sleeping well last night, described her appetite and concentration as 'good', and energy level as 'normal'. Per pt's self inventory, pt rated her depression, hopelessness and anxiety all 0's. Pt's stated goal today is to work on going home.   Pt currently denies SI/HI and AVH and doesn't appear to be responding to internal stimuli. A. Labs and vitals monitored. Pt supported emotionally and encouraged to express concerns and ask questions.   R. Pt remains safe with 15 minute checks. Will continue POC.    08/18/24 0900  Psych Admission Type (Psych Patients Only)  Admission Status Voluntary/72 hour document signed  Date 72 hour document signed  08/18/24  Time 72 hour document signed  [id number]  Provider Notified (First and Last Name) (see details for LINK to note) Bouchard and Aggie  Psychosocial Assessment  Patient Complaints None  Eye Contact Fair  Facial Expression Flat  Affect Appropriate to circumstance  Speech Logical/coherent  Interaction Assertive  Motor Activity Fidgety  Appearance/Hygiene Unremarkable  Behavior Characteristics Cooperative;Calm  Mood Pleasant  Thought Process  Coherency WDL  Content WDL  Delusions None reported or observed  Perception WDL  Hallucination None reported or observed  Judgment Poor  Confusion None  Danger to Self  Current suicidal ideation? Denies  Danger to Others  Danger to Others None reported or observed

## 2024-08-18 NOTE — [facility] Suicide Risk Assessment (Signed)
 Suicide Risk Assessment  Admission Assessment    [facility] Admission Suicide Risk Assessment   Nursing information obtained from:  Patient  Demographic factors:  Unemployed  Current Mental Status:  Suicidal ideation indicated by others  Loss Factors:  Decline in physical health  Historical Factors:  NA  Risk Reduction Factors:  Sense of responsibility to family, Living with another person, especially a relative, Positive social support  Total Time spent with patient: 1.5 hours  Principal Problem: Undifferentiated schizophrenia (HCC)  Diagnosis:  Principal Problem:   Undifferentiated schizophrenia (HCC)  Subjective Data: Seen H&P.  Continued Clinical Symptoms:  Alcohol Use Disorder Identification Test Final Score (AUDIT): 0 The Alcohol Use Disorders Identification Test, Guidelines for Use in Primary Care, Second Edition.  World [employer] writer [facility]). Score between 0-7:  no or low risk or alcohol related problems. Score between 8-15:  moderate risk of alcohol related problems. Score between 16-19:  high risk of alcohol related problems. Score 20 or above:  warrants further diagnostic evaluation for alcohol dependence and treatment.  CLINICAL FACTORS:   Schizophrenia:   Paranoid or undifferentiated type Unstable or Poor Therapeutic Relationship Previous Psychiatric Diagnoses and Treatments  Musculoskeletal: Strength & Muscle Tone: within normal limits Gait & Station: normal Patient leans: N/A  Psychiatric Specialty Exam:  Presentation  General Appearance:  Casual; Fairly Groomed  Eye Contact: Fair  Speech: Clear and Coherent (soft spoken.)  Speech Volume: Decreased  Handedness: Right   Mood and Affect  Mood: -- (Patient currently denies any symptoms of depression or anxiety.)  Affect: Restricted   Thought Process  Thought Processes: Coherent  Descriptions of Associations:Intact  Orientation:Full (Time, Place and Person)  Thought  Content:Logical  History of Schizophrenia/Schizoaffective disorder:Yes  Duration of Psychotic Symptoms:Greater than six months  Hallucinations:Hallucinations: None  Ideas of Reference:None  Suicidal Thoughts:Suicidal Thoughts: No  Homicidal Thoughts:Homicidal Thoughts: No   Sensorium  Memory: Immediate Good; Recent Fair; Remote Fair  Judgment: Fair  Insight: Fair   Art therapist  Concentration: Fair  Attention Span: Fair  Recall: Fair  Fund of Knowledge: Fair  Language: Good   Psychomotor Activity  Psychomotor Activity:Psychomotor Activity: Decreased   Assets  Assets: Communication Skills; Desire for Improvement; Housing; Social Support   Sleep  Sleep:Sleep: Good Number of Hours of Sleep: 7    Physical Exam: Physical Exam Vitals and nursing note reviewed.  HENT:     Head: Normocephalic.     Nose: Nose normal.  Eyes:     Pupils: Pupils are equal, round, and reactive to light.  Cardiovascular:     Rate and Rhythm: Normal rate.     Pulses: Normal pulses.  Pulmonary:     Effort: Pulmonary effort is normal.  Genitourinary:    Comments: Deferred. Musculoskeletal:        General: Normal range of motion.     Cervical back: Normal range of motion.  Skin:    General: Skin is dry.  Neurological:     General: No focal deficit present.     Mental Status: She is alert and oriented to person, place, and time.    Review of Systems  Constitutional:  Negative for chills, diaphoresis and fever.  HENT:  Negative for congestion and sore throat.   Respiratory:  Negative for cough, shortness of breath and wheezing.   Cardiovascular:  Negative for chest pain and palpitations.  Gastrointestinal:  Negative for abdominal pain, constipation, diarrhea, heartburn, nausea and vomiting.  Genitourinary:  Negative for dysuria.  Musculoskeletal:  Negative for joint  pain and myalgias.  Neurological:  Negative for dizziness, tingling, tremors, sensory  change, speech change, focal weakness, seizures, loss of consciousness, weakness and headaches.  Endo/Heme/Allergies:        See allergy lists.  Psychiatric/Behavioral:  Positive for depression.    Blood pressure 126/82, pulse (!) 103, temperature 99 F (37.2 C), temperature source Oral, resp. rate 18, height 4' 11 (1.499 m), weight 54.7 kg, last menstrual period 09/08/2012, SpO2 100%. Body mass index is 24.36 kg/m.   COGNITIVE FEATURES THAT CONTRIBUTE TO RISK:  Thought constriction (tunnel vision)    SUICIDE RISK:   Moderate:  Frequent suicidal ideation with limited intensity, and duration, some specificity in terms of plans, no associated intent, good self-control, limited dysphoria/symptomatology, some risk factors present, and identifiable protective factors, including available and accessible social support.  PLAN OF CARE:  See H&P.  I certify that inpatient services furnished can reasonably be expected to improve the patient's condition.   Mac Bolster, NP, pmhnp, fnp-bc. 08/18/2024, 1:35 PM

## 2024-08-18 NOTE — [facility] Counselor (Signed)
 Adult Comprehensive Assessment  Patient ID: Brandy Hogan, female   DOB: 1966-01-21, 58 y.o.   MRN: [id number]  Information Source: Information source: Patient  Current Stressors:  Patient states their primary concerns and needs for treatment are:: Inconsistent sleep, the medications I was on gave me side effects Patient states their goals for this hospitilization and ongoing recovery are:: I had to get the medications right Educational / Learning stressors: None reported Employment / Job issues: None reported Family Relationships: Teenage driver in the house Financial / Lack of resources (include bankruptcy): Just the hospital bills Housing / Lack of housing: None reported Physical health (include injuries & life threatening diseases): None reported Social relationships: None reported Substance abuse: None reported Bereavement / Loss: None reported  Living/Environment/Situation:  Living Arrangements: Spouse/significant other Living conditions (as described by patient or guardian): House Who else lives in the home?: Husband and daughter How long has patient lived in current situation?: May 2010 What is atmosphere in current home: Comfortable, Supportive  Family History:  Marital status: Married Number of Years Married: 19 What types of issues is patient dealing with in the relationship?: None reported What is your sexual orientation?: Heterosexual Has your sexual activity been affected by drugs, alcohol, medication, or emotional stress?: No Does patient have children?: Yes How many children?: 3 How is patient's relationship with their children?: Things with my daughter are okay, some days are better than the others. The others are grown  Childhood History:  By whom was/is the patient raised?: Mother Additional childhood history information: Patient states that her father was around until she was in the 6th grade and then he left to travel here and there during  those years Description of patient's relationship with caregiver when they were a child: Things were pretty good Patient's description of current relationship with people who raised him/her: They are both deceased How were you disciplined when you got in trouble as a child/adolescent?: I wasn't really Does patient have siblings?: Yes Number of Siblings: 6 Description of patient's current relationship with siblings: We communicate, moreso with my sister who is local Did patient suffer any verbal/emotional/physical/sexual abuse as a child?: No Did patient suffer from severe childhood neglect?: No Has patient ever been sexually abused/assaulted/raped as an adolescent or adult?: No Was the patient ever a victim of a crime or a disaster?: No Witnessed domestic violence?: No Has patient been affected by domestic violence as an adult?: No  Education:  Highest grade of school patient has completed: 4 years of college Currently a Consulting civil engineer?: No Learning disability?: No  Employment/Work Situation:   Employment Situation: Unemployed Patient's Job has Been Impacted by Current Illness: No What is the Longest Time Patient has Held a Job?: 30 years 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?: No  Financial Resources:   Financial resources: Income from employment, Media planner (I get a pension but I am looking for work too) Does patient have a Lawyer or guardian?: No  Alcohol/Substance Abuse:   What has been your use of drugs/alcohol within the last 12 months?: None reported If attempted suicide, did drugs/alcohol play a role in this?: No Alcohol/Substance Abuse Treatment Hx: Denies past history Has alcohol/substance abuse ever caused legal problems?: No  Social Support System:   Patient's Community Support System: Good Describe Community Support System: Husband, family Type of faith/religion: Non  denominational How does patient's faith help to cope with current illness?: UTA  Leisure/Recreation:   Do You Have Hobbies?: Yes Leisure and Hobbies: Crafts  Strengths/Needs:   What is the patient's perception of their strengths?: I like to work hard towards setting goals and achieving them Patient states they can use these personal strengths during their treatment to contribute to their recovery: I did that by getting the right medications Patient states these barriers may affect/interfere with their treatment: None reported Patient states these barriers may affect their return to the community: None reported  Discharge Plan:   Currently receiving community mental health services: Yes (From Whom) Patient states concerns and preferences for aftercare planning are: [facility] and unsure about therapist Patient states they will know when they are safe and ready for discharge when: I signed the 72hr form, I would like to leave Does patient have access to transportation?: Yes Does patient have financial barriers related to discharge medications?: No Will patient be returning to same living situation after discharge?: Yes  Summary/Recommendations:   Summary and Recommendations (to be completed by the evaluator): Brandy Hogan is a 58yo female who is voluntarily admitted to [facility] secondary to [facility] due to increased anxiety, insomnia, thought blocking, and possible AVH. Pt was recently discharged from [facility] a few weeks ago. Reports she has not been consistently taking her medications and has negative side effects from them. Stressors include her daughter learning to drive and concerns for hospital bills due to her multiple recent admissions. Lives at home with her husband and daughter, has 2 other children who are grown and out of the house. Denies substance use, SI, HI, and AVH. Was alert and coherent during assessment, did mumble a few times and had no eye contact. Signed a 72hr today. Pt feels  ready for discharge. Was scheduled to see [facility] for a psych appt on yesterday 9/3. While here, Brandy Hogan can benefit from crisis stabilization, medication management, therapeutic milieu, and referrals for services.   Brandy Hogan. 08/18/2024

## 2024-08-18 NOTE — Plan of Care (Signed)
  Problem: Health Behavior/Discharge Planning: Goal: Compliance with treatment plan for underlying cause of condition will improve 08/18/2024 1905 by Inda Berwyn RAMAN, RN Outcome: Progressing 08/18/2024 1905 by Inda Berwyn RAMAN, RN Outcome: Progressing   Problem: Physical Regulation: Goal: Ability to maintain clinical measurements within normal limits will improve 08/18/2024 1905 by Inda Berwyn RAMAN, RN Outcome: Progressing 08/18/2024 1905 by Inda Berwyn RAMAN, RN Outcome: Progressing

## 2024-08-19 ENCOUNTER — Encounter (HOSPITAL_COMMUNITY): Payer: Self-pay

## 2024-08-19 DIAGNOSIS — F203 Undifferentiated schizophrenia: Secondary | ICD-10-CM | POA: Diagnosis not present

## 2024-08-19 MED ORDER — LISINOPRIL 5 MG PO TABS
5.0000 mg | ORAL_TABLET | Freq: Every day | ORAL | Status: DC
  Administered 2024-08-20: 5 mg via ORAL
  Filled 2024-08-19: qty 1

## 2024-08-19 NOTE — Group Note (Signed)
 Recreation Therapy Group Note   Group Topic:Team Building  Group Date: 08/19/2024 Start Time: 0935 End Time: 1005 Facilitators: Dawnn Nam-McCall, LRT,CTRS Location: [address] Dayroom   Group Topic: Communication, Team Building, Problem Solving  Goal Area(s) Addresses:  Patient will effectively work with peer towards shared goal.  Patient will identify skills used to make activity successful.  Patient will share challenges and verbalize solution-driven approaches used. Patient will identify how skills used during activity can be used to reach post d/c goals.   Behavioral Response: Observant  Intervention: STEM Activity   Activity: [employer]. Patients were provided the following materials: 4 drinking straws, 5 rubber bands, 5 paper clips, 2 index cards and 2 drinking cups. Using the provided materials patients were asked to build a launching mechanism to launch a ping pong ball across the room, approximately 10 feet. Patients were divided into teams of 3-5. Instructions required all materials be incorporated into the device, functionality of items left to the peer group's discretion.  Education: Pharmacist, community, Scientist, physiological, Air cabin crew, Building control surveyor.   Education Outcome: Acknowledges education/In group clarification    Affect/Mood: Flat   Participation Level: None   Participation Quality: Independent   Behavior: On-looking   Speech/Thought Process: None   Insight: None   Judgement: None   Modes of Intervention: STEM Activity   Patient Response to Interventions:  Attentive   Education Outcome:  In group clarification offered    Clinical Observations/Individualized Feedback: Pt came into group for the last couple of minutes. Pt observed as peers were finishing up with their launchers. Pt was attentive as peers worked.      Plan: Continue to engage patient in RT group sessions 2-3x/week.   Maricella Filyaw-McCall, LRT,CTRS 08/19/2024 12:26 PM

## 2024-08-19 NOTE — Group Note (Signed)
 Date:  08/19/2024 Time:  9:20 AM  Group Topic/Focus:  Goals Group:   The focus of this group is to help patients establish daily goals to achieve during treatment and discuss how the patient can incorporate goal setting into their daily lives to aide in recovery.    Participation Level:  Active  Participation Quality:  Appropriate  Affect:  Appropriate  Cognitive:  Appropriate  Insight: Appropriate  Engagement in Group:  Engaged  Modes of Intervention:  Discussion  Additional Comments:  pt wants to attend more groups  Nat Rummer 08/19/2024, 9:20 AM

## 2024-08-19 NOTE — Group Note (Signed)
 Date:  08/19/2024 Time:  4:10 PM  Group Topic/Focus:  Medication Side effect    Participation Level:  Active  Participation Quality:  Appropriate  Affect:  Appropriate  Cognitive:  Alert and Appropriate  Insight: Appropriate  Engagement in Group:  Engaged  Modes of Intervention:  Education    Brandy Hogan Lowers 08/19/2024, 4:10 PM

## 2024-08-19 NOTE — Progress Notes (Signed)
 [facility] MD Progress Note  08/19/2024 6:00 PM Brandy Hogan  MRN:  [id number]  Reason for admission: 57 year old AA female with prior hx of mental illnesses. Patient is known in this [facility] from her previous admissions & treatments. She was admitted, treated & discharged from this [facility] last month. At the time, she was admitted with complaint of worsening insomnia/anxiety & intrusive thoughts of an impending doom on her family. Patient is being re-admitted to the [facility] this time from the [facility] with complaint of a lot of worrying & probably difficult sleeping.   Daily notes: Brandy Hogan is seen this morning. Chart reviewed. The chart findings discussed with the treatment team. She presents alert, oriented x 3. She presents with a sad/restricted affect making a fair eye contact. She is verbally responsive. She reports, I'm doing alright today. My mood is okay. I slept pretty good last night. I have something to ask the SW. I was seeing Dr. Shirline on outpatient basis as my psychiatrist. But I would like to start seeing a provider from the [facility]. My insurance support person had recommended the switch. Patient remains quiet. She appears depressed from her facial expression, slow to response & not completing some of her statements. She states that she had attended group session already this morning. She currently denies any SIHI, AVH, delusional thoughts or paranoia. She does not appear to be responding to any internal stimuli. There are no changes made on the current plan of care. Continue as already in progress. Will let the SW know about patient requests. Started patient on a low dose of lisinopril  5 mg daily x 3 days for slightly elevated blood pressure.  Principal Problem: Undifferentiated schizophrenia (HCC)  Diagnosis: Principal Problem:   Undifferentiated schizophrenia (HCC)  Total Time spent with patient: 45 minutes  Past Psychiatric History: See H&P  Past Medical History:  Past Medical History:   Diagnosis Date   Asthma    History of admission to inpatient psychiatry department 12/12/2022   [facility] 10/2022   Psychosis [facility])    History reviewed. No pertinent surgical history. Family History:  Family History  Problem Relation Age of Onset   Schizophrenia Mother    Bipolar disorder Sister    Family Psychiatric  History: See H&P.  Social History:  Social History   Substance and Sexual Activity  Alcohol Use Never     Social History   Substance and Sexual Activity  Drug Use Never    Social History   Socioeconomic History   Marital status: Married    Spouse name: Not on file   Number of children: Not on file   Years of education: Not on file   Highest education level: Not on file  Occupational History   Not on file  Tobacco Use   Smoking status: Never   Smokeless tobacco: Never  Substance and Sexual Activity   Alcohol use: Never   Drug use: Never   Sexual activity: Yes  Other Topics Concern   Not on file  Social History Narrative   Not on file   Social Drivers of Health   Financial Resource Strain: Not on file  Food Insecurity: No Food Insecurity (08/17/2024)   Hunger Vital Sign    Worried About Running Out of Food in the Last Year: Never true    Ran Out of Food in the Last Year: Never true  Transportation Needs: No Transportation Needs (08/17/2024)   PRAPARE - Administrator, Civil Service (Medical):  No    Lack of Transportation (Non-Medical): No  Physical Activity: Not on file  Stress: Not on file  Social Connections: Not on file   Additional Social History:   Sleep: Good Estimated Sleeping Duration (Last 24 Hours): 7.75-8.50 hours  Appetite:  Good  Current Medications: Current Facility-Administered Medications  Medication Dose Route Frequency Provider Last Rate Last Admin   albuterol  (VENTOLIN  HFA) 108 (90 Base) MCG/ACT inhaler 1 puff  1 puff Inhalation Q6H PRN Aayliah Rotenberry I, NP       cetirizine  (ZYRTEC ) tablet 10 mg  10 mg  Oral QHS Onuoha, Chinwendu V, NP   10 mg at 08/18/24 2112   cholecalciferol  (VITAMIN D3) 25 MCG (1000 UNIT) tablet 2,000 Units  2,000 Units Oral Daily Mianna Iezzi I, NP   2,000 Units at 08/19/24 9171   EPINEPHrine  (EPI-PEN) injection 0.3 mg  0.3 mg Intramuscular Daily PRN Arloa Suzen RAMAN, NP       FLUoxetine  (PROZAC ) capsule 10 mg  10 mg Oral Daily Toriano Aikey I, NP   10 mg at 08/19/24 9170   hydrOXYzine  (ATARAX ) tablet 25 mg  25 mg Oral TID PRN Arloa Suzen RAMAN, NP   25 mg at 08/18/24 2112   OLANZapine  (ZYPREXA ) injection 5 mg  5 mg Intramuscular TID PRN Arloa Suzen RAMAN, NP       OLANZapine  (ZYPREXA ) tablet 5 mg  5 mg Oral QHS Arloa Suzen RAMAN, NP   5 mg at 08/18/24 2112   OLANZapine  zydis (ZYPREXA ) disintegrating tablet 5 mg  5 mg Oral TID PRN Arloa Suzen RAMAN, NP       traZODone  (DESYREL ) tablet 50 mg  50 mg Oral QHS PRN Collene Gouge I, NP        Lab Results: No results found for this or any previous visit (from the past 48 hours).  Blood Alcohol level:  Lab Results  Component Value Date   [facility] <15 07/21/2024   ETH <10 12/15/2023    Metabolic Disorder Labs: Lab Results  Component Value Date   HGBA1C 5.6 07/21/2024   MPG 114 07/21/2024   MPG 123 12/15/2023   Lab Results  Component Value Date   PROLACTIN 12.7 11/07/2022   Lab Results  Component Value Date   CHOL 254 (H) 07/21/2024   TRIG 54 07/21/2024   HDL 109 07/21/2024   CHOLHDL 2.3 07/21/2024   VLDL 11 07/21/2024   LDLCALC 134 (H) 07/21/2024   LDLCALC 172 (H) 12/15/2023    Physical Findings: AIMS:  ,  ,  ,  ,  ,  ,   CIWA:    COWS:     Musculoskeletal: Strength & Muscle Tone: within normal limits Gait & Station: normal Patient leans: N/A  Psychiatric Specialty Exam:  Presentation  General Appearance:  Casual; Fairly Groomed  Eye Contact: Fair  Speech: Clear and Coherent (soft spoken.)  Speech Volume: Decreased  Handedness: Right   Mood and Affect  Mood: -- (Patient currently  denies any symptoms of depression or anxiety.)  Affect: Restricted   Thought Process  Thought Processes: Coherent  Descriptions of Associations:Intact  Orientation:Full (Time, Place and Person)  Thought Content:Logical  History of Schizophrenia/Schizoaffective disorder:Yes  Duration of Psychotic Symptoms:Greater than six months  Hallucinations:Hallucinations: None  Ideas of Reference:None  Suicidal Thoughts:Suicidal Thoughts: No  Homicidal Thoughts:Homicidal Thoughts: No   Sensorium  Memory: Immediate Good; Recent Fair; Remote Fair  Judgment: Fair  Insight: Fair   Art therapist  Concentration: Fair  Attention Span: Fair  Recall:  Fair  Fund of Knowledge: Fair  Language: Good   Psychomotor Activity  Psychomotor Activity: Psychomotor Activity: Decreased   Assets  Assets: Communication Skills; Desire for Improvement; Housing; Social Support   Sleep  Sleep: Sleep: Good Number of Hours of Sleep: 7    Physical Exam: Physical Exam Vitals and nursing note reviewed.  HENT:     Nose: Nose normal.  Cardiovascular:     Pulses: Normal pulses.     Comments: Elevated blood pressure: 140/96. Pulmonary:     Effort: Pulmonary effort is normal.  Genitourinary:    Comments: Deferred Musculoskeletal:        General: Normal range of motion.  Skin:    General: Skin is dry.  Neurological:     General: No focal deficit present.     Mental Status: She is alert and oriented to person, place, and time.    Review of Systems  Constitutional:  Negative for chills, diaphoresis and fever.  HENT:  Negative for congestion and sore throat.   Respiratory:  Negative for cough, shortness of breath and wheezing.   Cardiovascular:  Negative for chest pain and palpitations.  Gastrointestinal:  Negative for abdominal pain, diarrhea, heartburn, nausea and vomiting.  Genitourinary:  Negative for dysuria.  Musculoskeletal:  Negative for joint pain and  myalgias.  Neurological:  Negative for dizziness, tingling, tremors, sensory change, speech change, focal weakness, seizures, loss of consciousness, weakness and headaches.  Endo/Heme/Allergies:        See the allergy lists.  Psychiatric/Behavioral:  Positive for depression. Negative for hallucinations, memory loss, substance abuse and suicidal ideas. The patient is not nervous/anxious and does not have insomnia.    Blood pressure (!) 140/96, pulse 84, temperature 98.4 F (36.9 C), temperature source Oral, resp. rate 16, height 4' 11 (1.499 m), weight 54.7 kg, last menstrual period 09/08/2012, SpO2 98%. Body mass index is 24.36 kg/m.  Treatment Plan Summary: Daily contact with patient to assess and evaluate symptoms and progress in treatment and Medication management.   Principal/active diagnoses.  Undifferentiated schizophrenia. Major depressive disorder, recurrent episode, severe.  Generalized anxiety disorder.    R/o OCD.   Plan: The risks/benefits/side-effects/alternatives to the medications in use were discussed in detail with the patient and time was given for patient's questions. The patient consents to medication trial.    -Continue Fluoxetine  10 mg po Q daily for depression.  -Continue Zyprexa  5 mg po Q hs for mood stabilization/insomnia.  -Continue Hydroxyzine  25 mg po tid prn for anxiety.  -Continue Trazodone  50 mg po q hs prn for insomnia.  -Initiated lisinopril  5 mg po daily x 3 days for elevated b/p.   Agitation protocols:  -Continue as recommended (See MAR).   Other PRNS -Continue Tylenol  650 mg every 6 hours PRN for mild pain -Continue Maalox 30 ml Q 4 hrs PRN for indigestion -Continue MOM 30 ml po Q 6 hrs for constipation   Safety and Monitoring: Voluntary admission to inpatient psychiatric unit for safety, stabilization and treatment Daily contact with patient to assess and evaluate symptoms and progress in treatment Patient's case to be discussed in  multi-disciplinary team meeting Observation Level : q15 minute checks Vital signs: q12 hours Precautions: Safety   Discharge Planning: Social work and case management to assist with discharge planning and identification of hospital follow-up needs prior to discharge Estimated LOS: 5-7 days Discharge Concerns: Need to establish a safety plan; Medication compliance and effectiveness Discharge Goals: Return home with outpatient referrals for mental health follow-up including  medication management/psychotherapy  Mac Bolster, NP, pmhnp, fnp-bc. 08/19/2024, 6:00 PM

## 2024-08-19 NOTE — BH IP Treatment Plan (Signed)
 Interdisciplinary Treatment and Diagnostic Plan Update  08/19/2024 Time of Session: 10:50 AM Nusayba Cadenas MRN: [id number]  Principal Diagnosis: Undifferentiated schizophrenia [facility])  Secondary Diagnoses: Principal Problem:   Undifferentiated schizophrenia (HCC)   Current Medications:  Current Facility-Administered Medications  Medication Dose Route Frequency Provider Last Rate Last Admin   albuterol  (VENTOLIN  HFA) 108 (90 Base) MCG/ACT inhaler 1 puff  1 puff Inhalation Q6H PRN Collene Gouge I, NP       cetirizine  (ZYRTEC ) tablet 10 mg  10 mg Oral QHS Onuoha, Chinwendu V, NP   10 mg at 08/18/24 2112   cholecalciferol  (VITAMIN D3) 25 MCG (1000 UNIT) tablet 2,000 Units  2,000 Units Oral Daily Nwoko, Agnes I, NP   2,000 Units at 08/19/24 9171   EPINEPHrine  (EPI-PEN) injection 0.3 mg  0.3 mg Intramuscular Daily PRN Arloa Suzen RAMAN, NP       FLUoxetine  (PROZAC ) capsule 10 mg  10 mg Oral Daily Nwoko, Agnes I, NP   10 mg at 08/19/24 9170   hydrOXYzine  (ATARAX ) tablet 25 mg  25 mg Oral TID PRN Arloa Suzen RAMAN, NP   25 mg at 08/18/24 2112   OLANZapine  (ZYPREXA ) injection 5 mg  5 mg Intramuscular TID PRN Arloa Suzen RAMAN, NP       OLANZapine  (ZYPREXA ) tablet 5 mg  5 mg Oral QHS Arloa Suzen RAMAN, NP   5 mg at 08/18/24 2112   OLANZapine  zydis (ZYPREXA ) disintegrating tablet 5 mg  5 mg Oral TID PRN Arloa Suzen RAMAN, NP       traZODone  (DESYREL ) tablet 50 mg  50 mg Oral QHS PRN Nwoko, Agnes I, NP       PTA Medications: Medications Prior to Admission  Medication Sig Dispense Refill Last Dose/Taking   albuterol  (VENTOLIN  HFA) 108 (90 Base) MCG/ACT inhaler Inhale into the lungs every 6 (six) hours as needed for wheezing or shortness of breath.      betamethasone dipropionate 0.05 % cream Apply 1 Application topically daily as needed (eczema).      cholecalciferol  (VITAMIN D3) 25 MCG (1000 UNIT) tablet Take 2,000 Units by mouth daily.      desonide (DESOWEN) 0.05 % cream Apply 1  Application topically daily as needed (eczema).      hydrOXYzine  (ATARAX ) 25 MG tablet Take 1 tablet (25 mg total) by mouth 3 (three) times daily as needed for anxiety. 75 tablet 0    hydrOXYzine  (VISTARIL ) 25 MG capsule Take 25 mg by mouth 2 (two) times daily as needed for anxiety.      melatonin 5 MG TABS Take 5 mg by mouth at bedtime as needed (sleep).      Multiple Vitamin (MULTIVITAMIN WITH MINERALS) TABS tablet Take 1 tablet by mouth daily.      risperiDONE  (RISPERDAL ) 1 MG tablet Take 1 tablet (1 mg total) by mouth at bedtime. For mood control 30 tablet 0    sertraline  (ZOLOFT ) 50 MG tablet Take 1 tablet (50 mg total) by mouth daily. For depression/anxiety 30 tablet 0    traZODone  (DESYREL ) 50 MG tablet Take 1 tablet (50 mg total) by mouth at bedtime as needed for sleep. 30 tablet 0     Patient Stressors: Health problems   Medication change or noncompliance    Patient Strengths: Ability for insight  Supportive family/friends   Treatment Modalities: Medication Management, Group therapy, Case management,  1 to 1 session with clinician, Psychoeducation, Recreational therapy.   Physician Treatment Plan for Primary Diagnosis: Undifferentiated schizophrenia [facility]) Long Term  Goal(s): Improvement in symptoms so as ready for discharge   Short Term Goals: Ability to identify and develop effective coping behaviors will improve Ability to maintain clinical measurements within normal limits will improve Compliance with prescribed medications will improve Ability to identify triggers associated with substance abuse/mental health issues will improve Ability to verbalize feelings will improve Ability to disclose and discuss suicidal ideas Ability to demonstrate self-control will improve  Medication Management: Evaluate patient's response, side effects, and tolerance of medication regimen.  Therapeutic Interventions: 1 to 1 sessions, Unit Group sessions and Medication  administration.  Evaluation of Outcomes: Not Progressing  Physician Treatment Plan for Secondary Diagnosis: Principal Problem:   Undifferentiated schizophrenia (HCC)  Long Term Goal(s): Improvement in symptoms so as ready for discharge   Short Term Goals: Ability to identify and develop effective coping behaviors will improve Ability to maintain clinical measurements within normal limits will improve Compliance with prescribed medications will improve Ability to identify triggers associated with substance abuse/mental health issues will improve Ability to verbalize feelings will improve Ability to disclose and discuss suicidal ideas Ability to demonstrate self-control will improve     Medication Management: Evaluate patient's response, side effects, and tolerance of medication regimen.  Therapeutic Interventions: 1 to 1 sessions, Unit Group sessions and Medication administration.  Evaluation of Outcomes: Not Progressing   RN Treatment Plan for Primary Diagnosis: Undifferentiated schizophrenia (HCC) Long Term Goal(s): Knowledge of disease and therapeutic regimen to maintain health will improve  Short Term Goals: Ability to remain free from injury will improve, Ability to verbalize frustration and anger appropriately will improve, Ability to demonstrate self-control, Ability to participate in decision making will improve, Ability to verbalize feelings will improve, Ability to disclose and discuss suicidal ideas, Ability to identify and develop effective coping behaviors will improve, and Compliance with prescribed medications will improve  Medication Management: RN will administer medications as ordered by provider, will assess and evaluate patient's response and provide education to patient for prescribed medication. RN will report any adverse and/or side effects to prescribing provider.  Therapeutic Interventions: 1 on 1 counseling sessions, Psychoeducation, Medication administration,  Evaluate responses to treatment, Monitor vital signs and CBGs as ordered, Perform/monitor CIWA, COWS, AIMS and Fall Risk screenings as ordered, Perform wound care treatments as ordered.  Evaluation of Outcomes: Not Progressing   LCSW Treatment Plan for Primary Diagnosis: Undifferentiated schizophrenia (HCC) Long Term Goal(s): Safe transition to appropriate next level of care at discharge, Engage patient in therapeutic group addressing interpersonal concerns.  Short Term Goals: Engage patient in aftercare planning with referrals and resources, Increase social support, Increase ability to appropriately verbalize feelings, Increase emotional regulation, Facilitate acceptance of mental health diagnosis and concerns, Facilitate patient progression through stages of change regarding substance use diagnoses and concerns, Identify triggers associated with mental health/substance abuse issues, and Increase skills for wellness and recovery  Therapeutic Interventions: Assess for all discharge needs, 1 to 1 time with Social worker, Explore available resources and support systems, Assess for adequacy in community support network, Educate family and significant other(s) on suicide prevention, Complete Psychosocial Assessment, Interpersonal group therapy.  Evaluation of Outcomes: Not Progressing   Progress in Treatment: Attending groups: Yes. Participating in groups: Yes. Taking medication as prescribed: Yes. Toleration medication: Yes. Family/Significant other contact made: Yes, individual(s) contacted:  Zaniyah Wernette (husband) [phone] Patient understands diagnosis: Yes. Discussing patient identified problems/goals with staff: Yes. Medical problems stabilized or resolved: Yes. Denies suicidal/homicidal ideation: Yes. Issues/concerns per patient self-inventory: No.  New problem(s) identified:  No  New Short Term/Long Term Goal(s):    medication stabilization, elimination of SI thoughts,  development of comprehensive mental wellness plan.    Patient Goals:  I want to think more positively.     Discharge Plan or Barriers:  Patient recently admitted. CSW will continue to follow and assess for appropriate referrals and possible discharge planning.    Reason for Continuation of Hospitalization: Anxiety Depression Hallucinations Medication stabilization  Estimated Length of Stay: 5 - 7 days  Last 3 [place] Suicide Severity Risk Score: Flowsheet Row [facility] in [facility] (Discharged[facility]  C-SSRS RISK CATEGORY No Risk Error: Q3, 4, or 5 should not be populated when Q2 is No Low Risk    Last PHQ 2/9 Scores:    11/07/2023   12:12 PM  Depression screen PHQ 2/9  Decreased Interest 3  Down, Depressed, Hopeless 2  PHQ - 2 Score 5  Altered sleeping 2  Tired, decreased energy 2  Change in appetite 3  Feeling bad or failure about yourself  3  Trouble concentrating 3  Moving slowly or fidgety/restless 1  Suicidal thoughts 1  PHQ-9 Score 20  Difficult doing work/chores Somewhat difficult    Scribe for Treatment Team: Matelyn Antonelli O Caeli Linehan, LCSWA 08/19/2024 5:24 PM

## 2024-08-19 NOTE — [facility] Suicide Risk Assessment (Signed)
 [facility] INPATIENT:  Family/Significant Other Suicide Prevention Education  Suicide Prevention Education:  Education Completed; Wajiha Versteeg (husband) [phone],  (name of family member/significant other) has been identified by the patient as the family member/significant other with whom the patient will be residing, and identified as the person(s) who will aid the patient in the event of a mental health crisis (suicidal ideations/suicide attempt).  With written consent from the patient, the family member/significant other has been provided the following suicide prevention education, prior to the and/or following the discharge of the patient.  No safety concerns with patient returning home at discharge, came to visit last night and reports pt is doing much better. Will pick up at discharge. Does not have access to firearms or weapons.  The suicide prevention education provided includes the following: Suicide risk factors Suicide prevention and interventions National Suicide Hotline telephone number [facility] assessment telephone number [facility] Emergency Assistance 911 [facility] and/or Residential Mobile Crisis Unit telephone number  Request made of family/significant other to: Remove weapons (e.g., guns, rifles, knives), all items previously/currently identified as safety concern.   Remove drugs/medications (over-the-counter, prescriptions, illicit drugs), all items previously/currently identified as a safety concern.  The family member/significant other verbalizes understanding of the suicide prevention education information provided.  The family member/significant other agrees to remove the items of safety concern listed above.  Jenkins LULLA Primer 08/19/2024, 11:24 AM

## 2024-08-19 NOTE — Plan of Care (Signed)
   Problem: Education: Goal: Knowledge of [place] [facility] Education information/materials will improve Outcome: Progressing Goal: Emotional status will improve Outcome: Progressing Goal: Mental status will improve Outcome: Progressing

## 2024-08-19 NOTE — Group Note (Signed)
 Date:  08/19/2024 Time:  9:20 PM  Group Topic/Focus:  Wrap-Up Group:   The focus of this group is to help patients review their daily goal of treatment and discuss progress on daily workbooks.    Additional Comments:  Pt attended the AA meeting without disruption.  Rosella DELENA Pouch 08/19/2024, 9:20 PM

## 2024-08-19 NOTE — Progress Notes (Signed)
   08/19/24 1100  Psych Admission Type (Psych Patients Only)  Admission Status Voluntary/72 hour document signed  Psychosocial Assessment  Patient Complaints None  Eye Contact Fair  Facial Expression Flat  Affect Appropriate to circumstance  Speech Logical/coherent  Interaction Assertive  Motor Activity Slow  Appearance/Hygiene Unremarkable  Behavior Characteristics Cooperative;Calm  Mood Pleasant  Thought Process  Coherency WDL  Content WDL  Delusions None reported or observed  Perception WDL  Hallucination None reported or observed  Judgment Poor  Confusion None  Danger to Self  Current suicidal ideation? Denies  Description of Suicide Plan No Plan  Agreement Not to Harm Self Yes  Description of Agreement Verbal  Danger to Others  Danger to Others None reported or observed

## 2024-08-19 NOTE — Plan of Care (Signed)
   Problem: Education: Goal: Emotional status will improve Outcome: Progressing Goal: Verbalization of understanding the information provided will improve Outcome: Progressing

## 2024-08-20 DIAGNOSIS — F203 Undifferentiated schizophrenia: Secondary | ICD-10-CM | POA: Diagnosis not present

## 2024-08-20 MED ORDER — CETIRIZINE HCL 10 MG PO TABS: 10.0000 mg | ORAL_TABLET | Freq: Every day | ORAL | Status: AC

## 2024-08-20 MED ORDER — FLUOXETINE HCL 10 MG PO CAPS: 10.0000 mg | ORAL_CAPSULE | Freq: Every day | ORAL | 0 refills | Status: AC

## 2024-08-20 MED ORDER — OLANZAPINE 5 MG PO TABS: 5.0000 mg | ORAL_TABLET | Freq: Every day | ORAL | 0 refills | Status: AC

## 2024-08-20 MED ORDER — HYDROXYZINE PAMOATE 25 MG PO CAPS: 25.0000 mg | ORAL_CAPSULE | Freq: Two times a day (BID) | ORAL | 0 refills | Status: AC | PRN

## 2024-08-20 MED ORDER — TRAZODONE HCL 50 MG PO TABS: 50.0000 mg | ORAL_TABLET | Freq: Every evening | ORAL | 0 refills | Status: AC | PRN

## 2024-08-20 MED ORDER — EPINEPHRINE 0.3 MG/0.3ML IJ SOAJ: 0.3000 mg | Freq: Every day | INTRAMUSCULAR | 0 refills | Status: AC | PRN

## 2024-08-20 MED ORDER — LISINOPRIL 5 MG PO TABS: 5.0000 mg | ORAL_TABLET | Freq: Every day | ORAL | 0 refills | Status: AC

## 2024-08-20 NOTE — Progress Notes (Signed)
  [facility] Adult Case Management Discharge Plan :  Will you be returning to the same living situation after discharge:  Yes,  patient will be returning home. At discharge, do you have transportation home?: Yes,  patient's husband to transport. Do you have the ability to pay for your medications: Yes,  patient has insurance.  Release of information consent forms completed and in the chart;  Patient's signature needed at discharge.  Patient to Follow up at:  Follow-up Information     [facility], [facility]. Go on 08/23/2024.   Why: You have an appointment for therapy services on 08/23/24 at 1:00 pm.  The appointment will be held in person, but you may call to switch to Virtual. Contact information: [address] [place] [address] [phone]         [facility] Follow up on 09/09/2024.   Why: You have an appointment for medication management services on 09/09/24 at 4:10 pm.   The appointment will be Virtual. Contact information: [address] [place] [address] [phone]                 Next level of care provider has access to [facility] Link:no  Safety Planning and Suicide Prevention discussed: Yes,  SPE completed with  Izetta Sakamoto (husband) [phone].     Has patient been referred to the Quitline?: Patient refused referral for treatment  Patient has been referred for addiction treatment: Patient refused referral for treatment.  Hunter JONELLE Lever, LCSWA 08/20/2024, 10:26 AM

## 2024-08-20 NOTE — Progress Notes (Signed)
   08/20/24 1000  Psych Admission Type (Psych Patients Only)  Admission Status Voluntary/72 hour document signed  Psychosocial Assessment  Patient Complaints None  Eye Contact Fair  Facial Expression Flat  Affect Flat  Speech Logical/coherent  Interaction Assertive  Motor Activity Slow  Appearance/Hygiene Unremarkable  Behavior Characteristics Cooperative;Calm  Mood Pleasant  Thought Process  Coherency WDL  Content WDL  Delusions None reported or observed  Perception WDL  Hallucination None reported or observed  Judgment Impaired  Confusion None  Danger to Self  Current suicidal ideation? Denies  Description of Suicide Plan No Plan  Agreement Not to Harm Self Yes  Description of Agreement Verbal  Danger to Others  Danger to Others None reported or observed

## 2024-08-20 NOTE — Progress Notes (Signed)
(  Sleep Hours) -7.75 (Any PRNs that were needed, meds refused, or side effects to meds)- prn hydroxyzine  @ 2120 (Any disturbances and when (visitation, over night)-none (Concerns raised by the patient)- none (SI/HI/AVH)- Denies all

## 2024-08-20 NOTE — [facility] Suicide Risk Assessment (Signed)
 Suicide Risk Assessment  Discharge Assessment    [facility] Discharge Suicide Risk Assessment   Principal Problem: Undifferentiated schizophrenia (HCC)  Discharge Diagnoses: Principal Problem:   Undifferentiated schizophrenia (HCC)  Total Time spent with patient: 45 minutes  Musculoskeletal: Strength & Muscle Tone: within normal limits Gait & Station: normal Patient leans: N/A  Psychiatric Specialty Exam  Presentation  General Appearance:  Appropriate for Environment; Casual; Fairly Groomed  Eye Contact: Good  Speech: Clear and Coherent; Normal Rate  Speech Volume: Normal  Handedness: Right   Mood and Affect  Mood: Euthymic  Duration of Depression Symptoms: Greater than two weeks  Affect: Congruent; Appropriate  Thought Process  Thought Processes: Coherent; Linear  Descriptions of Associations:Intact  Orientation:Full (Time, Place and Person)  Thought Content:Logical  History of Schizophrenia/Schizoaffective disorder:Yes  Duration of Psychotic Symptoms:Greater than six months  Hallucinations:Hallucinations: None  Ideas of Reference:None  Suicidal Thoughts:Suicidal Thoughts: No  Homicidal Thoughts:Homicidal Thoughts: No   Sensorium  Memory: Immediate Good; Recent Good; Remote Good  Judgment: Good  Insight: Fair  Art therapist  Concentration: Good  Attention Span: Good  Recall: Good  Fund of Knowledge: Fair  Language: Good  Psychomotor Activity  Psychomotor Activity: Psychomotor Activity: Normal  Assets  Assets: Communication Skills; Desire for Improvement; Financial Resources/Insurance; Housing; Physical Health; Social Support   Sleep  Sleep: Sleep: Good  Estimated Sleeping Duration (Last 24 Hours): 7.00-7.25 hours  Physical Exam: See H&P.  Blood pressure 130/87, pulse 96, temperature 98.6 F (37 C), temperature source Oral, resp. rate 16, height 4' 11 (1.499 m), weight 54.7 kg, last menstrual period  09/08/2012, SpO2 100%. Body mass index is 24.36 kg/m.  Mental Status Per Nursing Assessment::   On Admission:  Suicidal ideation indicated by others  Demographic Factors:  Adolescent or young adult and Low socioeconomic status  Loss Factors: NA  Historical Factors: NA  Risk Reduction Factors:   Sense of responsibility to family, Living with another person, especially a relative, Positive social support, Positive therapeutic relationship, and Positive coping skills or problem solving skills  Continued Clinical Symptoms:  Schizophrenia:   Paranoid or undifferentiated type Previous Psychiatric Diagnoses and Treatments  Cognitive Features That Contribute To Risk:  Thought constriction (tunnel vision)    Suicide Risk:  Minimal: No identifiable suicidal ideation.  Patients presenting with no risk factors but with morbid ruminations; may be classified as minimal risk based on the severity of the depressive symptoms   Follow-up Information     [facility], [facility]. Go on 08/23/2024.   Why: You have an appointment for therapy services on 08/23/24 at 1:00 pm.  The appointment will be held in person, but you may call to switch to Virtual. Contact information: [address] [place] [address] [phone]         [facility] Follow up on 09/09/2024.   Why: You have an appointment for medication management services on 09/09/24 at 4:10 pm.   The appointment will be Virtual. Contact information: [address] [place] [address] [phone]                Plan Of Care/Follow-up recommendations:  See the discharge recommendation above.  Mac Bolster, NP, pmhnp, fnp-bc. 08/20/2024, 10:08 AM

## 2024-08-20 NOTE — Group Note (Signed)
 Date:  08/20/2024 Time:  9:08 AM  Group Topic/Focus:  Goals Group:   The focus of this group is to help patients establish daily goals to achieve during treatment and discuss how the patient can incorporate goal setting into their daily lives to aide in recovery.    Participation Level:  Active  Participation Quality:  Appropriate  Affect:  Appropriate  Cognitive:  Appropriate  Insight: Appropriate  Engagement in Group:  Engaged  Modes of Intervention:  Discussion  Additional Comments:  NA  Devon Pretty A Sameul Tagle 08/20/2024, 9:08 AM

## 2024-08-20 NOTE — Progress Notes (Deleted)
 Physician Discharge Summary Note  Patient:  Brandy Hogan is an 58 y.o., female  MRN:  [id number]  DOB:  1966/05/24  Patient phone:  [phone] (home)   Patient address:   [address] [place] [address],   Total Time spent with patient: 45 minutes  Date of Admission:  08/17/2024  Date of Discharge: 08-20-24  Reason for Admission: Complaint of a lot of worrying & probably difficult sleeping.   Principal Problem: Undifferentiated schizophrenia [facility])  Discharge Diagnoses: Principal Problem:   Undifferentiated schizophrenia (HCC)  Past Psychiatric History: See H&P.  Past Medical History:  Past Medical History:  Diagnosis Date   Asthma    History of admission to inpatient psychiatry department 12/12/2022   [facility] 10/2022   Psychosis [facility])    History reviewed. No pertinent surgical history. Family History:  Family History  Problem Relation Age of Onset   Schizophrenia Mother    Bipolar disorder Sister    Family Psychiatric  History: See H&P.  Social History:  Social History   Substance and Sexual Activity  Alcohol Use Never     Social History   Substance and Sexual Activity  Drug Use Never    Social History   Socioeconomic History   Marital status: Married    Spouse name: Not on file   Number of children: Not on file   Years of education: Not on file   Highest education level: Not on file  Occupational History   Not on file  Tobacco Use   Smoking status: Never   Smokeless tobacco: Never  Substance and Sexual Activity   Alcohol use: Never   Drug use: Never   Sexual activity: Yes  Other Topics Concern   Not on file  Social History Narrative   Not on file   Social Drivers of Health   Financial Resource Strain: Not on file  Food Insecurity: No Food Insecurity (08/17/2024)   Hunger Vital Sign    Worried About Running Out of Food in the Last Year: Never true    Ran Out of Food in the Last Year: Never true  Transportation  Needs: No Transportation Needs (08/17/2024)   PRAPARE - Administrator, Civil Service (Medical): No    Lack of Transportation (Non-Medical): No  Physical Activity: Not on file  Stress: Not on file  Social Connections: Not on file   Hospital Course: (Per admission evaluation notes): 58 year old AA female with prior hx of mental illnesses. Patient is known in this [facility] from her previous admissions & treatments. She was admitted, treated & discharged from this [facility] last month. At the time, she was admitted with complaint of worsening insomnia/anxiety & intrusive thoughts of an impending doom on her family. Patient is being re-admitted to the [facility] this time from the [facility] with complaint of a lot of worrying & probably difficult sleeping.   This is the third psychiatric admission/discharge summaries for this 58 year old AA female with hx of mental illness. Brandy Hogan was a patient in this [facility] last month & in the early part of 2024 with complaint of worsening psychosis/insomnia. She was treated, stabilized & discharged at the time with medications & outpatient psychiatric services to help her maintain mood stability. Patient reported on this present admission that she did not comply with the discharge recommendations after she got home. She stated that she stopped taking her medicines because of side effects. After evaluation of her presenting symptoms this time around, Brandy Hogan was  again recommended for mood stabilization treatments.  On her last admission,Brandy Hogan was treated, stabilized & discharged on Risperdal  1 mg & Sertraline  50 mg. She stated on this present admission that she stopped taking the Risperdal  & Sertraline  shortly after she got home because they made her feel dizzy. Brandy Hogan was treated, stabilized & discharged this time on Zyprexa  5 mg & Prozac  10 mg for her symptoms. She was also enrolled & participated in the group counseling sessions being offered & held on this unit. She learned coping  skills. She presented other significant pre-existing medical issues that required treatment & or monitoring. She was resumed & discharged on the medication used for those symptoms. She tolerated her treatment regimen without any adverse effects or reactions reported.    Soon after her admission assessment, Brandy Hogan signed a 72 hour discharge notice. This discharge notice was due at 09:00 am this morning. It is also obvious that Brandy Hogan's symptoms responded well to her current treatment regimen. This is evidenced by her reports of improved mood, resolution of symptoms, presentation of good affect/eye contact & absence of other mood symptoms. She presents currently mentally & medically stable for discharge to continue mental health care on an outpatient basis as recommended below. However, this provider did reach out to patient's husband who stated that he visited his wife here at the hospital & noticed that she is doing much better than when he first brought her to the hospital. He states that he does not have any problems with his wife getting discharged today. And with this collateral information provided by patient's husband & patient's overall positive outlook & presentation, she is is discharged today as requested.   Today upon her discharge evaluation with her treatment team, Brandy Hogan shares that she is doing very well. She denies any other specific concerns. She is sleeping well. Her appetite is good. She denies any other physical complaints. She denies SI/HI/AH/VH, delusional thoughts or paranoia. She is tolerating her medications well & in agreement to continue her current regimen as recommended. She will follow up for routine psychiatric care & medication management as noted below. She is provided with all the necessary information needed to make these appointments without problems. She was able to engage in safety planning including plan to return to [facility] or contact emergency services if she feels unable to  maintain her own safety or the safety of others. Patient has no further questions, comments or concerns. She left [facility] with all personal belongings in no apparent distress. Transportation per her husband.  Physical Findings: AIMS:  , ,  ,  ,  ,  ,   CIWA:    COWS:     Musculoskeletal: Strength & Muscle Tone: within normal limits Gait & Station: normal Patient leans: N/A   Psychiatric Specialty Exam:  Presentation  General Appearance:  Appropriate for Environment; Casual; Fairly Groomed  Eye Contact: Good  Speech: Clear and Coherent; Normal Rate  Speech Volume: Normal  Handedness: Right  Mood and Affect  Mood: Euthymic  Affect: Congruent; Appropriate  Thought Process  Thought Processes: Coherent; Linear  Descriptions of Associations:Intact  Orientation:Full (Time, Place and Person)  Thought Content:Logical  History of Schizophrenia/Schizoaffective disorder:Yes  Duration of Psychotic Symptoms:Greater than six months  Hallucinations:Hallucinations: None  Ideas of Reference:None  Suicidal Thoughts:Suicidal Thoughts: No  Homicidal Thoughts:Homicidal Thoughts: No  Sensorium  Memory: Immediate Good; Recent Good; Remote Good  Judgment: Good  Insight: Fair  Executive Functions  Concentration: Good  Attention Span: Good  Recall: Good  Fund of Knowledge: Fair  Language: Good  Psychomotor Activity  Psychomotor Activity: Psychomotor Activity: Normal  Assets  Assets: Communication Skills; Desire for Improvement; Financial Resources/Insurance; Housing; Physical Health; Social Support  Sleep  Sleep: Sleep: Good   Estimated Sleeping Duration (Last 24 Hours): 7.00-7.25 hours  Physical Exam: Physical Exam Vitals and nursing note reviewed.  HENT:     Head: Normocephalic.     Nose: Nose normal.  Cardiovascular:     Rate and Rhythm: Normal rate.     Pulses: Normal pulses.  Pulmonary:     Effort: Pulmonary effort is normal.   Genitourinary:    Comments: Deferred Musculoskeletal:        General: Normal range of motion.     Cervical back: Normal range of motion.  Skin:    General: Skin is dry.  Neurological:     General: No focal deficit present.     Mental Status: She is alert and oriented to person, place, and time.    Review of Systems  Constitutional:  Negative for chills, diaphoresis and fever.  HENT:  Negative for congestion and sore throat.   Respiratory:  Negative for cough, shortness of breath and wheezing.   Cardiovascular:  Negative for chest pain and palpitations.  Gastrointestinal:  Negative for abdominal pain, constipation, diarrhea, heartburn, nausea and vomiting.  Genitourinary:  Negative for dysuria.  Musculoskeletal:  Negative for joint pain and myalgias.  Neurological:  Negative for dizziness, tingling, tremors, sensory change, speech change, focal weakness, seizures, loss of consciousness, weakness and headaches.  Endo/Heme/Allergies:        See allergy lists.  Psychiatric/Behavioral:  Positive for depression (Hx of (improved on medication).). Negative for hallucinations, memory loss, substance abuse and suicidal ideas. The patient has insomnia (Hx of (stable on medication).). The patient is not nervous/anxious (Stable upon discharge.).    Blood pressure 130/87, pulse 96, temperature 98.6 F (37 C), temperature source Oral, resp. rate 16, height 4' 11 (1.499 m), weight 54.7 kg, last menstrual period 09/08/2012, SpO2 100%. Body mass index is 24.36 kg/m.   Social History   Tobacco Use  Smoking Status Never  Smokeless Tobacco Never   Tobacco Cessation:  N/A, patient does not currently use tobacco products  Blood Alcohol level:  Lab Results  Component Value Date   [facility] <15 07/21/2024   ETH <10 12/15/2023   Metabolic Disorder Labs:  Lab Results  Component Value Date   HGBA1C 5.6 07/21/2024   MPG 114 07/21/2024   MPG 123 12/15/2023   Lab Results  Component Value Date    PROLACTIN 12.7 11/07/2022   Lab Results  Component Value Date   CHOL 254 (H) 07/21/2024   TRIG 54 07/21/2024   HDL 109 07/21/2024   CHOLHDL 2.3 07/21/2024   VLDL 11 07/21/2024   LDLCALC 134 (H) 07/21/2024   LDLCALC 172 (H) 12/15/2023   See Psychiatric Specialty Exam and Suicide Risk Assessment completed by Attending Physician prior to discharge.  Discharge destination:  Home  Is patient on multiple antipsychotic therapies at discharge:  No   Has Patient had three or more failed trials of antipsychotic monotherapy by history:  No  Recommended Plan for Multiple Antipsychotic Therapies: NA  Allergies as of 08/20/2024       Reactions   Dairycare [bacid] Anaphylaxis   Egg-derived Products Anaphylaxis   Methylprednisolone Acetate Anaphylaxis, Other (See Comments)   Throat Swelling and Airway Obstruction   Banana Swelling, Other (See Comments)   Eye swelling   Shellfish Allergy  Other (See Comments)   I was told to stay away from it   Pineapple Itching   Watermelon [citrullus Vulgaris] Other (See Comments)   Tongue tingling        Medication List     STOP taking these medications    betamethasone dipropionate 0.05 % cream   desonide 0.05 % cream Commonly known as: DESOWEN   hydrOXYzine  25 MG tablet Commonly known as: ATARAX    melatonin 5 MG Tabs   risperiDONE  1 MG tablet Commonly known as: RISPERDAL    sertraline  50 MG tablet Commonly known as: ZOLOFT        TAKE these medications      Indication  albuterol  108 (90 Base) MCG/ACT inhaler Commonly known as: VENTOLIN  HFA Inhale into the lungs every 6 (six) hours as needed for wheezing or shortness of breath.  Indication: Spasm of Lung Air Passages   cetirizine  10 MG tablet Commonly known as: ZYRTEC  Take 1 tablet (10 mg total) by mouth at bedtime. (May buy from over the counter): For allergies.  Indication: Hayfever, Sneezing   cholecalciferol  25 MCG (1000 UNIT) tablet Commonly known as: VITAMIN  D3 Take 2,000 Units by mouth daily.  Indication: Vitamin D  Deficiency   EPINEPHrine  0.3 mg/0.3 mL Soaj injection Commonly known as: EPI-PEN Inject 0.3 mg into the muscle daily as needed (Anaphylaxis).  Indication: Life-Threatening Hypersensitivity Reaction, Multiple allergies   FLUoxetine  10 MG capsule Commonly known as: PROZAC  Take 1 capsule (10 mg total) by mouth daily. For depression Start taking on: August 21, 2024  Indication: Generalized Anxiety Disorder, Major Depressive Disorder   hydrOXYzine  25 MG capsule Commonly known as: VISTARIL  Take 1 capsule (25 mg total) by mouth 2 (two) times daily as needed for anxiety.  Indication: Feeling Anxious   lisinopril  5 MG tablet Commonly known as: ZESTRIL  Take 1 tablet (5 mg total) by mouth daily. For elevated blood pressure. Start taking on: August 21, 2024  Indication: High Blood Pressure   multivitamin with minerals Tabs tablet Take 1 tablet by mouth daily.  Indication: Vitamin supplement.   OLANZapine  5 MG tablet Commonly known as: ZYPREXA  Take 1 tablet (5 mg total) by mouth at bedtime. For mood control  Indication: Schizophrenia   traZODone  50 MG tablet Commonly known as: DESYREL  Take 1 tablet (50 mg total) by mouth at bedtime as needed for sleep.  Indication: Trouble Sleeping         Follow-up Information     [facility], [facility]. Go on 08/23/2024.   Why: You have an appointment for therapy services on 08/23/24 at 1:00 pm.  The appointment will be held in person, but you may call to switch to Virtual. Contact information: [address] [place] [address] [phone]         [facility] Follow up on 09/09/2024.   Why: You have an appointment for medication management services on 09/09/24 at 4:10 pm.   The appointment will be Virtual. Contact information: [address] [place] [address] [phone]                Plan Of Care/Follow-up  recommendations:  Activity: as tolerated Diet: heart healthy  Other: -Follow-up with your outpatient psychiatric provider -instructions on appointment date, time, and address (location) are provided to you in discharge paperwork.  -Take your psychiatric medications as prescribed at discharge - instructions are provided to you in the discharge paperwork  -Follow-up with outpatient primary care doctor and other specialists -for  management of preventative medicine and chronic medical issues  -Testing: Follow-up with outpatient provider for abnormal lab results: NA  -If you are prescribed an atypical antipsychotic medication, we recommend that your outpatient psychiatrist follow routine screening for side effects within 3 months of discharge, including monitoring: AIMS scale, height, weight, blood pressure, fasting lipid panel, HbA1c, and fasting blood sugar.   -Recommend total abstinence from alcohol, tobacco, and other illicit drug use at discharge.   -If your psychiatric symptoms recur, worsen, or if you have side effects to your psychiatric medications, call your outpatient psychiatric provider, 911, [phone] or go to the nearest emergency department.  -If suicidal thoughts occur, immediately call your outpatient psychiatric provider, 911, [phone] or go to the nearest emergency department.   Signed: Mac Bolster, NP, pmhnp, fnp-bc. 08/20/2024, 10:20 AM  Patient ID: Brandy Hogan, female   DOB: 1966/08/23, 58 y.o.   MRN: [id number]

## 2024-08-20 NOTE — Progress Notes (Signed)
 Pt discharged to lobby to her husband. Pt was stable and appreciative at that time. All papers and prescriptions were given and valuables returned. Verbal understanding expressed. Denies SI/HI and A/VH. Pt given opportunity to express concerns and ask questions.

## 2024-08-21 NOTE — Discharge Summary (Signed)
 Physician Discharge Summary Note   Patient:  Brandy Hogan is an 58 y.o., female   MRN:  [id number]   DOB:  1966-07-31   Patient phone:  [phone] (home)            Patient address:   [address] [place] [address],       Total Time spent with patient: 45 minutes   Date of Admission:  08/17/2024   Date of Discharge: 08-20-24   Reason for Admission: Complaint of a lot of worrying & probably difficult sleeping.    Principal Problem: Undifferentiated schizophrenia [facility])   Discharge Diagnoses: Principal Problem:   Undifferentiated schizophrenia (HCC)   Past Psychiatric History: See H&P.   Past Medical History:      Past Medical History:  Diagnosis Date   Asthma     History of admission to inpatient psychiatry department 12/12/2022    [facility] 10/2022   Psych[facility])          History reviewed. No pertinent surgical history.     Family History:       Family History  Problem Relation Age of Onset   Schizophrenia Mother     Bipolar disorder Sister          Family Psychiatric  History: See H&P.   Social History:  Social History       Substance and Sexual Activity  Alcohol Use Never     Social History       Substance and Sexual Activity  Drug Use Never    Social History         Socioeconomic History   Marital status: Married      Spouse name: Not on file   Number of children: Not on file   Years of education: Not on file   Highest education level: Not on file  Occupational History   Not on file  Tobacco Use   Smoking status: Never   Smokeless tobacco: Never  Substance and Sexual Activity   Alcohol use: Never   Drug use: Never   Sexual activity: Yes  Other Topics Concern   Not on file  Social History Narrative   Not on file    Social Drivers of Health        Financial Resource Strain: Not on file  Food Insecurity: No Food Insecurity (08/17/2024)    Hunger Vital Sign     Worried About Running Out of Food in  the Last Year: Never true     Ran Out of Food in the Last Year: Never true  Transportation Needs: No Transportation Needs (08/17/2024)    PRAPARE - Therapist, art (Medical): No     Lack of Transportation (Non-Medical): No  Physical Activity: Not on file  Stress: Not on file  Social Connections: Not on file    Hospital Course: (Per admission evaluation notes): 58 year old AA female with prior hx of mental illnesses. Patient is known in this [facility] from her previous admissions & treatments. She was admitted, treated & discharged from this [facility] last month. At the time, she was admitted with complaint of worsening insomnia/anxiety & intrusive thoughts of an impending doom on her family. Patient is being re-admitted to the [facility] this time from the [facility] with complaint of a lot of worrying & probably difficult sleeping.    This is the third psychiatric admission/discharge summaries for this 58 year old AA female with hx of mental  illness. Brandy Hogan was a patient in this [facility] last month & in the early part of 2024 with complaint of worsening psychosis/insomnia. She was treated, stabilized & discharged at the time with medications & outpatient psychiatric services to help her maintain mood stability. Patient reported on this present admission that she did not comply with the discharge recommendations after she got home. She stated that she stopped taking her medicines because of side effects. After evaluation of her presenting symptoms this time around, Brandy Hogan was again recommended for mood stabilization treatments.   On her last admission,Brandy Hogan was treated, stabilized & discharged on Risperdal  1 mg & Sertraline  50 mg. She stated on this present admission that she stopped taking the Risperdal  & Sertraline  shortly after she got home because they made her feel dizzy. Brandy Hogan was treated, stabilized & discharged this time on Zyprexa  5 mg & Prozac  10 mg for her symptoms. She was also enrolled &  participated in the group counseling sessions being offered & held on this unit. She learned coping skills. She presented other significant pre-existing medical issues that required treatment & or monitoring. She was resumed & discharged on the medication used for those symptoms. She tolerated her treatment regimen without any adverse effects or reactions reported.    Soon after her admission assessment, Brandy Hogan signed a 72 hour discharge notice. This discharge notice was due at 09:00 am this morning. It is also obvious that Brandy Hogan's symptoms responded well to her current treatment regimen. This is evidenced by her reports of improved mood, resolution of symptoms, presentation of good affect/eye contact & absence of other mood symptoms. She presents currently mentally & medically stable for discharge to continue mental health care on an outpatient basis as recommended below. However, this provider did reach out to patient's husband who stated that he visited his wife here at the hospital & noticed that she is doing much better than when he first brought her to the hospital. He states that he does not have any problems with his wife getting discharged today. And with this collateral information provided by patient's husband & patient's overall positive outlook & presentation, she is is discharged today as requested.   Today upon her discharge evaluation with her treatment team, Brandy Hogan shares that she is doing very well. She denies any other specific concerns. She is sleeping well. Her appetite is good. She denies any other physical complaints. She denies SI/HI/AH/VH, delusional thoughts or paranoia. She is tolerating her medications well & in agreement to continue her current regimen as recommended. She will follow up for routine psychiatric care & medication management as noted below. She is provided with all the necessary information needed to make these appointments without problems. She was able to engage in  safety planning including plan to return to [facility] or contact emergency services if she feels unable to maintain her own safety or the safety of others. Patient has no further questions, comments or concerns. She left [facility] with all personal belongings in no apparent distress. Transportation per her husband.   Physical Findings: AIMS:  , ,  ,  ,  ,  ,   CIWA:    COWS:      Musculoskeletal: Strength & Muscle Tone: within normal limits Gait & Station: normal Patient leans: N/A     Psychiatric Specialty Exam:   Presentation  General Appearance:  Appropriate for Environment; Casual; Fairly Groomed   Eye Contact: Good   Speech: Clear and Coherent; Normal Rate   Speech Volume: Normal  Handedness: Right   Mood and Affect  Mood: Euthymic   Affect: Congruent; Appropriate   Thought Process  Thought Processes: Coherent; Linear   Descriptions of Associations:Intact   Orientation:Full (Time, Place and Person)   Thought Content:Logical   History of Schizophrenia/Schizoaffective disorder:Yes   Duration of Psychotic Symptoms:Greater than six months   Hallucinations:Hallucinations: None   Ideas of Reference:None   Suicidal Thoughts:Suicidal Thoughts: No   Homicidal Thoughts:Homicidal Thoughts: No   Sensorium  Memory: Immediate Good; Recent Good; Remote Good   Judgment: Good   Insight: Fair   Art therapist  Concentration: Good   Attention Span: Good   Recall: Good   Fund of Knowledge: Fair   Language: Good   Psychomotor Activity  Psychomotor Activity: Psychomotor Activity: Normal   Assets  Assets: Communication Skills; Desire for Improvement; Financial Resources/Insurance; Housing; Physical Health; Social Support   Sleep  Sleep: Sleep: Good     Estimated Patient Durations  Estimated Sleeping Duration (Last 24 Hours): 7.00-7.25 hours     Physical Exam: Physical Exam Vitals and nursing note reviewed.  HENT:     Head:  Normocephalic.     Nose: Nose normal.  Cardiovascular:     Rate and Rhythm: Normal rate.     Pulses: Normal pulses.  Pulmonary:     Effort: Pulmonary effort is normal.  Genitourinary:    Comments: Deferred Musculoskeletal:        General: Normal range of motion.     Cervical back: Normal range of motion.  Skin:    General: Skin is dry.  Neurological:     General: No focal deficit present.     Mental Status: She is alert and oriented to person, place, and time.     Review of Systems  Constitutional:  Negative for chills, diaphoresis and fever.  HENT:  Negative for congestion and sore throat.   Respiratory:  Negative for cough, shortness of breath and wheezing.   Cardiovascular:  Negative for chest pain and palpitations.  Gastrointestinal:  Negative for abdominal pain, constipation, diarrhea, heartburn, nausea and vomiting.  Genitourinary:  Negative for dysuria.  Musculoskeletal:  Negative for joint pain and myalgias.  Neurological:  Negative for dizziness, tingling, tremors, sensory change, speech change, focal weakness, seizures, loss of consciousness, weakness and headaches.  Endo/Heme/Allergies:        See allergy lists.  Psychiatric/Behavioral:  Positive for depression (Hx of (improved on medication).). Negative for hallucinations, memory loss, substance abuse and suicidal ideas. The patient has insomnia (Hx of (stable on medication).). The patient is not nervous/anxious (Stable upon discharge.).    Blood pressure 130/87, pulse 96, temperature 98.6 F (37 C), temperature source Oral, resp. rate 16, height 4' 11 (1.499 m), weight 54.7 kg, last menstrual period 09/08/2012, SpO2 100%. Body mass index is 24.36 kg/m.     Tobacco Use History  Social History       Tobacco Use  Smoking Status Never  Smokeless Tobacco Never      Tobacco Cessation:  N/A, patient does not currently use tobacco products   Blood Alcohol level:  Recent Labs       Lab Results  Component Value  Date    [employer] <15 07/21/2024    ETH <10 12/15/2023      Metabolic Disorder Labs:  Recent Labs       Lab Results  Component Value Date    HGBA1C 5.6 07/21/2024    MPG 114 07/21/2024    MPG 123 12/15/2023  Recent Labs       Lab Results  Component Value Date    PROLACTIN 12.7 11/07/2022      Recent Labs       Lab Results  Component Value Date    CHOL 254 (H) 07/21/2024    TRIG 54 07/21/2024    HDL 109 07/21/2024    CHOLHDL 2.3 07/21/2024    VLDL 11 07/21/2024    LDLCALC 134 (H) 07/21/2024    LDLCALC 172 (H) 12/15/2023      See Psychiatric Specialty Exam and Suicide Risk Assessment completed by Attending Physician prior to discharge.   Discharge destination:  Home   Is patient on multiple antipsychotic therapies at discharge:  No   Has Patient had three or more failed trials of antipsychotic monotherapy by history:  No   Recommended Plan for Multiple Antipsychotic Therapies: NA   Allergies as of 08/20/2024         Reactions    Dairycare [bacid] Anaphylaxis    Egg-derived Products Anaphylaxis    Methylprednisolone Acetate Anaphylaxis, Other (See Comments)    Throat Swelling and Airway Obstruction    Banana Swelling, Other (See Comments)    Eye swelling    Shellfish Allergy Other (See Comments)    I was told to stay away from it    Pineapple Itching    Watermelon [citrullus Vulgaris] Other (See Comments)    Tongue tingling            Medication List       STOP taking these medications     betamethasone dipropionate 0.05 % cream    desonide 0.05 % cream Commonly known as: DESOWEN    hydrOXYzine  25 MG tablet Commonly known as: ATARAX     melatonin 5 MG Tabs    risperiDONE  1 MG tablet Commonly known as: RISPERDAL     sertraline  50 MG tablet Commonly known as: ZOLOFT            TAKE these medications       Indication  albuterol  108 (90 Base) MCG/ACT inhaler Commonly known as: VENTOLIN  HFA Inhale into the lungs every 6 (six) hours as  needed for wheezing or shortness of breath.   Indication: Spasm of Lung Air Passages    cetirizine  10 MG tablet Commonly known as: ZYRTEC  Take 1 tablet (10 mg total) by mouth at bedtime. (May buy from over the counter): For allergies.   Indication: Hayfever, Sneezing    cholecalciferol  25 MCG (1000 UNIT) tablet Commonly known as: VITAMIN D3 Take 2,000 Units by mouth daily.   Indication: Vitamin D  Deficiency    EPINEPHrine  0.3 mg/0.3 mL Soaj injection Commonly known as: EPI-PEN Inject 0.3 mg into the muscle daily as needed (Anaphylaxis).   Indication: Life-Threatening Hypersensitivity Reaction, Multiple allergies    FLUoxetine  10 MG capsule Commonly known as: PROZAC  Take 1 capsule (10 mg total) by mouth daily. For depression Start taking on: August 21, 2024   Indication: Generalized Anxiety Disorder, Major Depressive Disorder    hydrOXYzine  25 MG capsule Commonly known as: VISTARIL  Take 1 capsule (25 mg total) by mouth 2 (two) times daily as needed for anxiety.   Indication: Feeling Anxious    lisinopril  5 MG tablet Commonly known as: ZESTRIL  Take 1 tablet (5 mg total) by mouth daily. For elevated blood pressure. Start taking on: August 21, 2024   Indication: High Blood Pressure    multivitamin with minerals Tabs tablet Take 1 tablet by mouth daily.   Indication: Vitamin supplement.  OLANZapine  5 MG tablet Commonly known as: ZYPREXA  Take 1 tablet (5 mg total) by mouth at bedtime. For mood control   Indication: Schizophrenia    traZODone  50 MG tablet Commonly known as: DESYREL  Take 1 tablet (50 mg total) by mouth at bedtime as needed for sleep.   Indication: Trouble Sleeping               Follow-up Information       [facility]. Go on 08/23/2024.   Why: You have an appointment for therapy services on 08/23/24 at 1:00 pm.  The appointment will be held in person, but you may call to switch to Virtual. Contact information: [address] [place] [address] [phone]              [facility] Follow up on 09/09/2024.   Why: You have an appointment for medication management services on 09/09/24 at 4:10 pm.   The appointment will be Virtual. Contact information: [address] [place] [address] [phone]                        Plan Of Care/Follow-up recommendations:  Activity: as tolerated Diet: heart healthy   Other: -Follow-up with your outpatient psychiatric provider -instructions on appointment date, time, and address (location) are provided to you in discharge paperwork.   -Take your psychiatric medications as prescribed at discharge - instructions are provided to you in the discharge paperwork   -Follow-up with outpatient primary care doctor and other specialists -for management of preventative medicine and chronic medical issues   -Testing: Follow-up with outpatient provider for abnormal lab results: NA   -If you are prescribed an atypical antipsychotic medication, we recommend that your outpatient psychiatrist follow routine screening for side effects within 3 months of discharge, including monitoring: AIMS scale, height, weight, blood pressure, fasting lipid panel, HbA1c, and fasting blood sugar.    -Recommend total abstinence from alcohol, tobacco, and other illicit drug use at discharge.    -If your psychiatric symptoms recur, worsen, or if you have side effects to your psychiatric medications, call your outpatient psychiatric provider, 911, [phone] or go to the nearest emergency department.   -If suicidal thoughts occur, immediately call your outpatient psychiatric provider, 911, [phone] or go to the nearest emergency department.    Signed: Mac Bolster, NP, pmhnp, fnp-bc. 08/20/2024, 10:20 AM   Patient ID: Brandy Hogan, female   DOB: [birth year], 58 y.o.   MRN: [id number]       Revision History
